# Patient Record
Sex: Female | Born: 1935 | Race: White | Hispanic: No | Smoking: Never smoker
Health system: Southern US, Community
[De-identification: ages and names within clinical notes are randomized; demographics above are authoritative.]

## PROBLEM LIST (undated history)

## (undated) DIAGNOSIS — J309 Allergic rhinitis, unspecified: Secondary | ICD-10-CM

## (undated) DIAGNOSIS — I4891 Unspecified atrial fibrillation: Secondary | ICD-10-CM

## (undated) DIAGNOSIS — M1990 Unspecified osteoarthritis, unspecified site: Secondary | ICD-10-CM

## (undated) DIAGNOSIS — F419 Anxiety disorder, unspecified: Secondary | ICD-10-CM

## (undated) DIAGNOSIS — K219 Gastro-esophageal reflux disease without esophagitis: Secondary | ICD-10-CM

## (undated) DIAGNOSIS — R05 Cough: Secondary | ICD-10-CM

## (undated) DIAGNOSIS — J189 Pneumonia, unspecified organism: Secondary | ICD-10-CM

## (undated) DIAGNOSIS — I10 Essential (primary) hypertension: Secondary | ICD-10-CM

## (undated) DIAGNOSIS — N3941 Urge incontinence: Secondary | ICD-10-CM

## (undated) DIAGNOSIS — E785 Hyperlipidemia, unspecified: Secondary | ICD-10-CM

## (undated) DIAGNOSIS — E871 Hypo-osmolality and hyponatremia: Secondary | ICD-10-CM

## (undated) HISTORY — DX: Cough: R05

## (undated) HISTORY — PX: TOTAL ABDOMINAL HYSTERECTOMY: SHX209

## (undated) HISTORY — PX: CARDIOVASCULAR STRESS TEST: SHX262

## (undated) HISTORY — PX: APPENDECTOMY: SHX54

## (undated) HISTORY — DX: Hypo-osmolality and hyponatremia: E87.1

## (undated) HISTORY — PX: TRANSTHORACIC ECHOCARDIOGRAM: SHX275

## (undated) HISTORY — DX: Hyperlipidemia, unspecified: E78.5

## (undated) HISTORY — DX: Essential (primary) hypertension: I10

## (undated) HISTORY — DX: Urge incontinence: N39.41

## (undated) HISTORY — DX: Unspecified atrial fibrillation: I48.91

## (undated) HISTORY — DX: Allergic rhinitis, unspecified: J30.9

## (undated) HISTORY — DX: Gastro-esophageal reflux disease without esophagitis: K21.9

---

## 1999-07-16 ENCOUNTER — Encounter: Payer: Self-pay | Admitting: Obstetrics and Gynecology

## 1999-07-16 ENCOUNTER — Encounter: Admission: RE | Admit: 1999-07-16 | Discharge: 1999-07-16 | Payer: Self-pay | Admitting: Obstetrics and Gynecology

## 1999-08-22 ENCOUNTER — Encounter: Admission: RE | Admit: 1999-08-22 | Discharge: 1999-08-22 | Payer: Self-pay | Admitting: Obstetrics and Gynecology

## 1999-08-22 ENCOUNTER — Encounter: Payer: Self-pay | Admitting: Obstetrics and Gynecology

## 2000-08-25 ENCOUNTER — Encounter: Admission: RE | Admit: 2000-08-25 | Discharge: 2000-08-25 | Payer: Self-pay | Admitting: Obstetrics and Gynecology

## 2000-08-25 ENCOUNTER — Encounter: Payer: Self-pay | Admitting: Obstetrics and Gynecology

## 2001-07-12 ENCOUNTER — Encounter: Payer: Self-pay | Admitting: Obstetrics and Gynecology

## 2001-07-12 ENCOUNTER — Encounter: Admission: RE | Admit: 2001-07-12 | Discharge: 2001-07-12 | Payer: Self-pay | Admitting: Obstetrics and Gynecology

## 2001-07-15 ENCOUNTER — Other Ambulatory Visit: Admission: RE | Admit: 2001-07-15 | Discharge: 2001-07-15 | Payer: Self-pay | Admitting: Obstetrics and Gynecology

## 2002-07-13 ENCOUNTER — Encounter: Admission: RE | Admit: 2002-07-13 | Discharge: 2002-07-13 | Payer: Self-pay | Admitting: Gynecology

## 2002-07-13 ENCOUNTER — Encounter: Payer: Self-pay | Admitting: Gynecology

## 2002-07-25 ENCOUNTER — Other Ambulatory Visit: Admission: RE | Admit: 2002-07-25 | Discharge: 2002-07-25 | Payer: Self-pay | Admitting: Gynecology

## 2003-01-05 ENCOUNTER — Encounter: Admission: RE | Admit: 2003-01-05 | Discharge: 2003-01-05 | Payer: Self-pay | Admitting: Unknown Physician Specialty

## 2003-01-05 ENCOUNTER — Encounter: Payer: Self-pay | Admitting: Unknown Physician Specialty

## 2003-09-20 ENCOUNTER — Encounter: Admission: RE | Admit: 2003-09-20 | Discharge: 2003-09-20 | Payer: Self-pay | Admitting: Gynecology

## 2004-10-07 ENCOUNTER — Encounter: Admission: RE | Admit: 2004-10-07 | Discharge: 2004-10-07 | Payer: Self-pay | Admitting: Gynecology

## 2004-11-25 ENCOUNTER — Other Ambulatory Visit: Admission: RE | Admit: 2004-11-25 | Discharge: 2004-11-25 | Payer: Self-pay | Admitting: Gynecology

## 2005-10-13 ENCOUNTER — Encounter: Admission: RE | Admit: 2005-10-13 | Discharge: 2005-10-13 | Payer: Self-pay | Admitting: Gynecology

## 2006-11-09 ENCOUNTER — Encounter: Admission: RE | Admit: 2006-11-09 | Discharge: 2006-11-09 | Payer: Self-pay | Admitting: Gynecology

## 2007-03-22 ENCOUNTER — Other Ambulatory Visit: Admission: RE | Admit: 2007-03-22 | Discharge: 2007-03-22 | Payer: Self-pay | Admitting: Gynecology

## 2007-11-29 ENCOUNTER — Encounter: Admission: RE | Admit: 2007-11-29 | Discharge: 2007-11-29 | Payer: Self-pay | Admitting: Gynecology

## 2008-10-16 ENCOUNTER — Encounter: Admission: RE | Admit: 2008-10-16 | Discharge: 2008-10-16 | Payer: Self-pay | Admitting: Gynecology

## 2008-10-17 ENCOUNTER — Ambulatory Visit (HOSPITAL_BASED_OUTPATIENT_CLINIC_OR_DEPARTMENT_OTHER): Admission: RE | Admit: 2008-10-17 | Discharge: 2008-10-18 | Payer: Self-pay | Admitting: Gynecology

## 2009-06-14 ENCOUNTER — Encounter: Admission: RE | Admit: 2009-06-14 | Discharge: 2009-06-14 | Payer: Self-pay | Admitting: Gynecology

## 2010-01-08 ENCOUNTER — Encounter: Admission: RE | Admit: 2010-01-08 | Discharge: 2010-01-08 | Payer: Self-pay | Admitting: Gynecology

## 2010-11-07 LAB — POCT I-STAT 4, (NA,K, GLUC, HGB,HCT)
Hemoglobin: 15.6 g/dL — ABNORMAL HIGH (ref 12.0–15.0)
Potassium: 3.5 mEq/L (ref 3.5–5.1)

## 2010-12-10 NOTE — Op Note (Signed)
NAMEJADWIGA, Sandra Lynch              ACCOUNT NO.:  [id number]   MEDICAL RECORD NO.:  [id number]          PATIENT TYPE:  AMB   LOCATION:  [facility]                         FACILITY:  [facility]   PHYSICIAN:  Gretta Cool, M.D. DATE OF BIRTH:  04/16/1935   DATE OF PROCEDURE:  DATE OF DISCHARGE:                               OPERATIVE REPORT   PREOPERATIVE DIAGNOSES:  Pelvic organ prolapse.  No longer controlled by  pessary, with grade 3 enterocele, rectocele and bulb descent.  The   POSTOPERATIVE DIAGNOSES:  1. Pelvic organ prolapse.  No longer controlled by pessary, with grade      3 enterocele, rectocele and bulb descent.  2. Anterior enterocele.   PROCEDURE:  Posterior enterocele repairs, cardinal uterosacral  colposuspension and partial colpocleisis.   SURGEON:  Beather Arbour, MD.   ASSISTANT:  Lodema Hong, MD.   ANESTHESIA:  General orotracheal.   DESCRIPTION OF PROCEDURE:  Under excellent general anesthesia with the  patient prepped and draped in the lithotomy position with Allen  stirrups, the introitus was grasped with Allis clamps and a V-shaped  incision was made between the clamps with removal of the intervening  skin and subcutaneous tissue.  At this point the vaginal mucosa was  grasped with Allis clamps and undermined from the introitus to the apex  of the vagina.  An enormous bulge of enterocele with exceedingly thin  mucosa overlying the bowel and enterocele sac was identified.  .  With  dissection of the enterocele to the apex of the vagina, the previous  vaginal closure was identified.  Anterior to that previous vaginal  closure there was a second anterior enterocele that bulged through an  exceedingly thin vaginal mucosa.  There was easy transillumination of  bowel underneath..  At this point dissection was continued past the  adhesions at the previous vaginal cuff closure at her hysterectomy done  elsewhere.  The anterior and posterior enterocele sacs were then  opened  and reduced by pursestring closure, so as to pushed bowel up into an  intra-abdominal position.  Progressive pursestring closures were used to  reduce the enterocele sac anterior and posterior to a manageable size.  At this point the fascia was plicated in the midline with mattress-type  interrupted sutures so as to further reduce the enormous enterocele, a  softball-size bulge through the introitus down to a size that could  allow visualization of the apex of the vaginal cuff.  Once the  perirectal vaginal mucosa was dissected adequately from the perirectal  fascia and the underlying enterocele structures, the uterosacral  cardinal complex was identified on each side.  The interrupted sutures  of 2-0 Novofil was then secured through the uterosacral complex and  secured to the detached perirectal fascia.  The perirectal fascia was  then pulled back and secured to the cardinal uterosacral complex.  The  anterior peritoneum was dissected from the anterior wall and fascia.  The anterior fascia mucosa was then secured to the cardinal uterosacral  complex and strong posterior vaginal fascia, so as to make a complete  envelope of anterior vaginal fascia and prevent  recurrence of the  anterior enterocele.  The central portion of the fascia was then  approximated with permanent suture of 0-Novofil to close it in  pursestring fashion and the cardinal uterosacral complex at each angle,  posterior fascia and to anterior fascia.  A second suture of 0-Vicryl  was used to close by pursestring fashion the entire fascial defect.  The  upper portion of the perirectal fascia was then plicated in the midline  with running suture of 0-Vicryl from the apex of the vagina to the  introitus.  The levator fascia was plicated in the midline so as to  reinforce the fascial defects previously repaired.  The mucosa was then  trimmed and the upper layers of the perirectal fascia and vaginal mucosa  were closed  with a running closure of 2-0 Vicryl.  The perineal body  muscles were then rebuilt with interrupted sutures of 3-0 Vicryl and the  skin was closed and subcutaneum and submucosa were closed with a running  suture of 3-0 Vicryl.  At this point the procedure was terminated  without complication.  The patient was returned to the recovery room in  excellent condition.           ______________________________  Gretta Cool, M.D.     CWL/MEDQ  D:  10/17/2008  T:  10/17/2008  Job:  [id number]   cc:   Luvenia Redden, M.D.  Fax: [phone]   Dr. Arlyce Dice, [place]

## 2015-09-28 DIAGNOSIS — J069 Acute upper respiratory infection, unspecified: Secondary | ICD-10-CM | POA: Diagnosis not present

## 2015-09-28 DIAGNOSIS — F324 Major depressive disorder, single episode, in partial remission: Secondary | ICD-10-CM | POA: Insufficient documentation

## 2015-09-28 DIAGNOSIS — K2960 Other gastritis without bleeding: Secondary | ICD-10-CM | POA: Insufficient documentation

## 2015-09-28 DIAGNOSIS — Z79899 Other long term (current) drug therapy: Secondary | ICD-10-CM | POA: Insufficient documentation

## 2015-09-28 DIAGNOSIS — R079 Chest pain, unspecified: Secondary | ICD-10-CM | POA: Diagnosis not present

## 2015-09-28 DIAGNOSIS — M1611 Unilateral primary osteoarthritis, right hip: Secondary | ICD-10-CM | POA: Insufficient documentation

## 2015-09-28 DIAGNOSIS — IMO0002 Reserved for concepts with insufficient information to code with codable children: Secondary | ICD-10-CM | POA: Insufficient documentation

## 2015-09-28 DIAGNOSIS — G629 Polyneuropathy, unspecified: Secondary | ICD-10-CM | POA: Insufficient documentation

## 2015-09-28 DIAGNOSIS — M150 Primary generalized (osteo)arthritis: Secondary | ICD-10-CM

## 2015-09-28 DIAGNOSIS — N3946 Mixed incontinence: Secondary | ICD-10-CM | POA: Insufficient documentation

## 2015-09-28 DIAGNOSIS — R05 Cough: Secondary | ICD-10-CM | POA: Diagnosis not present

## 2015-09-28 DIAGNOSIS — Z9889 Other specified postprocedural states: Secondary | ICD-10-CM | POA: Insufficient documentation

## 2015-09-28 DIAGNOSIS — F419 Anxiety disorder, unspecified: Secondary | ICD-10-CM | POA: Diagnosis not present

## 2015-09-28 DIAGNOSIS — B9789 Other viral agents as the cause of diseases classified elsewhere: Secondary | ICD-10-CM | POA: Diagnosis not present

## 2015-09-28 DIAGNOSIS — E1142 Type 2 diabetes mellitus with diabetic polyneuropathy: Secondary | ICD-10-CM | POA: Insufficient documentation

## 2015-09-28 DIAGNOSIS — M159 Polyosteoarthritis, unspecified: Secondary | ICD-10-CM | POA: Insufficient documentation

## 2015-11-15 DIAGNOSIS — Z79899 Other long term (current) drug therapy: Secondary | ICD-10-CM | POA: Diagnosis not present

## 2015-11-15 DIAGNOSIS — G629 Polyneuropathy, unspecified: Secondary | ICD-10-CM | POA: Diagnosis not present

## 2015-11-15 DIAGNOSIS — R2689 Other abnormalities of gait and mobility: Secondary | ICD-10-CM | POA: Diagnosis not present

## 2015-11-15 DIAGNOSIS — I10 Essential (primary) hypertension: Secondary | ICD-10-CM | POA: Diagnosis not present

## 2015-11-15 DIAGNOSIS — E1142 Type 2 diabetes mellitus with diabetic polyneuropathy: Secondary | ICD-10-CM | POA: Diagnosis not present

## 2015-11-28 DIAGNOSIS — N3946 Mixed incontinence: Secondary | ICD-10-CM | POA: Diagnosis not present

## 2015-11-28 DIAGNOSIS — I10 Essential (primary) hypertension: Secondary | ICD-10-CM | POA: Diagnosis not present

## 2015-11-28 DIAGNOSIS — E7800 Pure hypercholesterolemia, unspecified: Secondary | ICD-10-CM | POA: Diagnosis not present

## 2015-12-25 DIAGNOSIS — Z1231 Encounter for screening mammogram for malignant neoplasm of breast: Secondary | ICD-10-CM | POA: Diagnosis not present

## 2016-02-05 DIAGNOSIS — F419 Anxiety disorder, unspecified: Secondary | ICD-10-CM | POA: Diagnosis not present

## 2016-02-05 DIAGNOSIS — F324 Major depressive disorder, single episode, in partial remission: Secondary | ICD-10-CM | POA: Diagnosis not present

## 2016-02-05 DIAGNOSIS — Z298 Encounter for other specified prophylactic measures: Secondary | ICD-10-CM | POA: Diagnosis not present

## 2016-02-05 DIAGNOSIS — I10 Essential (primary) hypertension: Secondary | ICD-10-CM | POA: Diagnosis not present

## 2016-02-05 DIAGNOSIS — E1142 Type 2 diabetes mellitus with diabetic polyneuropathy: Secondary | ICD-10-CM | POA: Diagnosis not present

## 2016-05-09 DIAGNOSIS — R269 Unspecified abnormalities of gait and mobility: Secondary | ICD-10-CM | POA: Insufficient documentation

## 2016-05-09 DIAGNOSIS — Z79899 Other long term (current) drug therapy: Secondary | ICD-10-CM | POA: Diagnosis not present

## 2016-05-09 DIAGNOSIS — Z23 Encounter for immunization: Secondary | ICD-10-CM | POA: Diagnosis not present

## 2016-05-09 DIAGNOSIS — E1142 Type 2 diabetes mellitus with diabetic polyneuropathy: Secondary | ICD-10-CM | POA: Diagnosis not present

## 2016-05-09 DIAGNOSIS — I10 Essential (primary) hypertension: Secondary | ICD-10-CM | POA: Diagnosis not present

## 2016-05-09 DIAGNOSIS — F419 Anxiety disorder, unspecified: Secondary | ICD-10-CM | POA: Diagnosis not present

## 2016-05-12 DIAGNOSIS — R2689 Other abnormalities of gait and mobility: Secondary | ICD-10-CM | POA: Diagnosis not present

## 2016-05-12 DIAGNOSIS — Q828 Other specified congenital malformations of skin: Secondary | ICD-10-CM | POA: Diagnosis not present

## 2016-05-12 DIAGNOSIS — M205X1 Other deformities of toe(s) (acquired), right foot: Secondary | ICD-10-CM | POA: Diagnosis not present

## 2016-05-12 DIAGNOSIS — M2011 Hallux valgus (acquired), right foot: Secondary | ICD-10-CM | POA: Diagnosis not present

## 2016-05-12 DIAGNOSIS — M2012 Hallux valgus (acquired), left foot: Secondary | ICD-10-CM | POA: Diagnosis not present

## 2016-05-16 DIAGNOSIS — Q828 Other specified congenital malformations of skin: Secondary | ICD-10-CM | POA: Diagnosis not present

## 2016-05-16 DIAGNOSIS — M205X1 Other deformities of toe(s) (acquired), right foot: Secondary | ICD-10-CM | POA: Diagnosis not present

## 2016-05-16 DIAGNOSIS — M2011 Hallux valgus (acquired), right foot: Secondary | ICD-10-CM | POA: Diagnosis not present

## 2016-05-16 DIAGNOSIS — R2689 Other abnormalities of gait and mobility: Secondary | ICD-10-CM | POA: Diagnosis not present

## 2016-05-21 DIAGNOSIS — M205X1 Other deformities of toe(s) (acquired), right foot: Secondary | ICD-10-CM | POA: Diagnosis not present

## 2016-05-21 DIAGNOSIS — M2011 Hallux valgus (acquired), right foot: Secondary | ICD-10-CM | POA: Diagnosis not present

## 2016-05-21 DIAGNOSIS — R2689 Other abnormalities of gait and mobility: Secondary | ICD-10-CM | POA: Diagnosis not present

## 2016-05-21 DIAGNOSIS — Q828 Other specified congenital malformations of skin: Secondary | ICD-10-CM | POA: Diagnosis not present

## 2016-05-23 DIAGNOSIS — R2689 Other abnormalities of gait and mobility: Secondary | ICD-10-CM | POA: Diagnosis not present

## 2016-05-23 DIAGNOSIS — Q828 Other specified congenital malformations of skin: Secondary | ICD-10-CM | POA: Diagnosis not present

## 2016-05-23 DIAGNOSIS — M2011 Hallux valgus (acquired), right foot: Secondary | ICD-10-CM | POA: Diagnosis not present

## 2016-05-23 DIAGNOSIS — M205X1 Other deformities of toe(s) (acquired), right foot: Secondary | ICD-10-CM | POA: Diagnosis not present

## 2016-05-28 DIAGNOSIS — Q828 Other specified congenital malformations of skin: Secondary | ICD-10-CM | POA: Diagnosis not present

## 2016-05-28 DIAGNOSIS — M2011 Hallux valgus (acquired), right foot: Secondary | ICD-10-CM | POA: Diagnosis not present

## 2016-05-28 DIAGNOSIS — M205X1 Other deformities of toe(s) (acquired), right foot: Secondary | ICD-10-CM | POA: Diagnosis not present

## 2016-05-28 DIAGNOSIS — R2689 Other abnormalities of gait and mobility: Secondary | ICD-10-CM | POA: Diagnosis not present

## 2016-05-30 DIAGNOSIS — R2689 Other abnormalities of gait and mobility: Secondary | ICD-10-CM | POA: Diagnosis not present

## 2016-05-30 DIAGNOSIS — M205X1 Other deformities of toe(s) (acquired), right foot: Secondary | ICD-10-CM | POA: Diagnosis not present

## 2016-05-30 DIAGNOSIS — Q828 Other specified congenital malformations of skin: Secondary | ICD-10-CM | POA: Diagnosis not present

## 2016-05-30 DIAGNOSIS — M2011 Hallux valgus (acquired), right foot: Secondary | ICD-10-CM | POA: Diagnosis not present

## 2016-06-02 DIAGNOSIS — H25813 Combined forms of age-related cataract, bilateral: Secondary | ICD-10-CM | POA: Diagnosis not present

## 2016-06-03 DIAGNOSIS — J069 Acute upper respiratory infection, unspecified: Secondary | ICD-10-CM | POA: Diagnosis not present

## 2016-06-03 DIAGNOSIS — B9789 Other viral agents as the cause of diseases classified elsewhere: Secondary | ICD-10-CM | POA: Diagnosis not present

## 2016-06-03 DIAGNOSIS — R269 Unspecified abnormalities of gait and mobility: Secondary | ICD-10-CM | POA: Diagnosis not present

## 2016-06-04 DIAGNOSIS — R269 Unspecified abnormalities of gait and mobility: Secondary | ICD-10-CM | POA: Diagnosis not present

## 2016-06-06 DIAGNOSIS — R2689 Other abnormalities of gait and mobility: Secondary | ICD-10-CM | POA: Diagnosis not present

## 2016-06-06 DIAGNOSIS — M205X1 Other deformities of toe(s) (acquired), right foot: Secondary | ICD-10-CM | POA: Diagnosis not present

## 2016-06-06 DIAGNOSIS — M2011 Hallux valgus (acquired), right foot: Secondary | ICD-10-CM | POA: Diagnosis not present

## 2016-06-06 DIAGNOSIS — Q828 Other specified congenital malformations of skin: Secondary | ICD-10-CM | POA: Diagnosis not present

## 2016-06-10 DIAGNOSIS — Q828 Other specified congenital malformations of skin: Secondary | ICD-10-CM | POA: Diagnosis not present

## 2016-06-10 DIAGNOSIS — M2011 Hallux valgus (acquired), right foot: Secondary | ICD-10-CM | POA: Diagnosis not present

## 2016-06-10 DIAGNOSIS — R2689 Other abnormalities of gait and mobility: Secondary | ICD-10-CM | POA: Diagnosis not present

## 2016-06-10 DIAGNOSIS — M205X1 Other deformities of toe(s) (acquired), right foot: Secondary | ICD-10-CM | POA: Diagnosis not present

## 2016-06-13 DIAGNOSIS — M205X1 Other deformities of toe(s) (acquired), right foot: Secondary | ICD-10-CM | POA: Diagnosis not present

## 2016-06-13 DIAGNOSIS — Q828 Other specified congenital malformations of skin: Secondary | ICD-10-CM | POA: Diagnosis not present

## 2016-06-13 DIAGNOSIS — M2011 Hallux valgus (acquired), right foot: Secondary | ICD-10-CM | POA: Diagnosis not present

## 2016-06-13 DIAGNOSIS — R2689 Other abnormalities of gait and mobility: Secondary | ICD-10-CM | POA: Diagnosis not present

## 2016-06-17 DIAGNOSIS — M205X1 Other deformities of toe(s) (acquired), right foot: Secondary | ICD-10-CM | POA: Diagnosis not present

## 2016-06-17 DIAGNOSIS — M2011 Hallux valgus (acquired), right foot: Secondary | ICD-10-CM | POA: Diagnosis not present

## 2016-06-17 DIAGNOSIS — R2689 Other abnormalities of gait and mobility: Secondary | ICD-10-CM | POA: Diagnosis not present

## 2016-06-17 DIAGNOSIS — Q828 Other specified congenital malformations of skin: Secondary | ICD-10-CM | POA: Diagnosis not present

## 2016-06-24 DIAGNOSIS — Q828 Other specified congenital malformations of skin: Secondary | ICD-10-CM | POA: Diagnosis not present

## 2016-06-24 DIAGNOSIS — R2689 Other abnormalities of gait and mobility: Secondary | ICD-10-CM | POA: Diagnosis not present

## 2016-06-24 DIAGNOSIS — M2011 Hallux valgus (acquired), right foot: Secondary | ICD-10-CM | POA: Diagnosis not present

## 2016-06-24 DIAGNOSIS — M205X1 Other deformities of toe(s) (acquired), right foot: Secondary | ICD-10-CM | POA: Diagnosis not present

## 2016-06-27 DIAGNOSIS — M205X1 Other deformities of toe(s) (acquired), right foot: Secondary | ICD-10-CM | POA: Diagnosis not present

## 2016-06-27 DIAGNOSIS — Q828 Other specified congenital malformations of skin: Secondary | ICD-10-CM | POA: Diagnosis not present

## 2016-06-27 DIAGNOSIS — M2011 Hallux valgus (acquired), right foot: Secondary | ICD-10-CM | POA: Diagnosis not present

## 2016-06-27 DIAGNOSIS — R2689 Other abnormalities of gait and mobility: Secondary | ICD-10-CM | POA: Diagnosis not present

## 2016-07-08 DIAGNOSIS — M2011 Hallux valgus (acquired), right foot: Secondary | ICD-10-CM | POA: Diagnosis not present

## 2016-07-08 DIAGNOSIS — M205X1 Other deformities of toe(s) (acquired), right foot: Secondary | ICD-10-CM | POA: Diagnosis not present

## 2016-07-08 DIAGNOSIS — R2689 Other abnormalities of gait and mobility: Secondary | ICD-10-CM | POA: Diagnosis not present

## 2016-07-08 DIAGNOSIS — Q828 Other specified congenital malformations of skin: Secondary | ICD-10-CM | POA: Diagnosis not present

## 2016-07-15 DIAGNOSIS — Q828 Other specified congenital malformations of skin: Secondary | ICD-10-CM | POA: Diagnosis not present

## 2016-07-15 DIAGNOSIS — M2011 Hallux valgus (acquired), right foot: Secondary | ICD-10-CM | POA: Diagnosis not present

## 2016-07-15 DIAGNOSIS — M205X1 Other deformities of toe(s) (acquired), right foot: Secondary | ICD-10-CM | POA: Diagnosis not present

## 2016-07-15 DIAGNOSIS — R2689 Other abnormalities of gait and mobility: Secondary | ICD-10-CM | POA: Diagnosis not present

## 2016-07-25 DIAGNOSIS — Q828 Other specified congenital malformations of skin: Secondary | ICD-10-CM | POA: Diagnosis not present

## 2016-07-25 DIAGNOSIS — R2689 Other abnormalities of gait and mobility: Secondary | ICD-10-CM | POA: Diagnosis not present

## 2016-07-25 DIAGNOSIS — M205X1 Other deformities of toe(s) (acquired), right foot: Secondary | ICD-10-CM | POA: Diagnosis not present

## 2016-07-25 DIAGNOSIS — M2011 Hallux valgus (acquired), right foot: Secondary | ICD-10-CM | POA: Diagnosis not present

## 2016-07-29 DIAGNOSIS — R2689 Other abnormalities of gait and mobility: Secondary | ICD-10-CM | POA: Diagnosis not present

## 2016-07-29 DIAGNOSIS — Q828 Other specified congenital malformations of skin: Secondary | ICD-10-CM | POA: Diagnosis not present

## 2016-07-29 DIAGNOSIS — M205X1 Other deformities of toe(s) (acquired), right foot: Secondary | ICD-10-CM | POA: Diagnosis not present

## 2016-07-29 DIAGNOSIS — M2011 Hallux valgus (acquired), right foot: Secondary | ICD-10-CM | POA: Diagnosis not present

## 2016-08-01 DIAGNOSIS — Q828 Other specified congenital malformations of skin: Secondary | ICD-10-CM | POA: Diagnosis not present

## 2016-08-01 DIAGNOSIS — R2689 Other abnormalities of gait and mobility: Secondary | ICD-10-CM | POA: Diagnosis not present

## 2016-08-01 DIAGNOSIS — M205X1 Other deformities of toe(s) (acquired), right foot: Secondary | ICD-10-CM | POA: Diagnosis not present

## 2016-08-01 DIAGNOSIS — M2011 Hallux valgus (acquired), right foot: Secondary | ICD-10-CM | POA: Diagnosis not present

## 2016-08-04 DIAGNOSIS — R2689 Other abnormalities of gait and mobility: Secondary | ICD-10-CM | POA: Diagnosis not present

## 2016-08-04 DIAGNOSIS — M2011 Hallux valgus (acquired), right foot: Secondary | ICD-10-CM | POA: Diagnosis not present

## 2016-08-04 DIAGNOSIS — M205X1 Other deformities of toe(s) (acquired), right foot: Secondary | ICD-10-CM | POA: Diagnosis not present

## 2016-08-04 DIAGNOSIS — Q828 Other specified congenital malformations of skin: Secondary | ICD-10-CM | POA: Diagnosis not present

## 2016-08-08 DIAGNOSIS — R2689 Other abnormalities of gait and mobility: Secondary | ICD-10-CM | POA: Diagnosis not present

## 2016-08-08 DIAGNOSIS — Q828 Other specified congenital malformations of skin: Secondary | ICD-10-CM | POA: Diagnosis not present

## 2016-08-08 DIAGNOSIS — M205X1 Other deformities of toe(s) (acquired), right foot: Secondary | ICD-10-CM | POA: Diagnosis not present

## 2016-08-08 DIAGNOSIS — M2011 Hallux valgus (acquired), right foot: Secondary | ICD-10-CM | POA: Diagnosis not present

## 2016-08-12 DIAGNOSIS — R2689 Other abnormalities of gait and mobility: Secondary | ICD-10-CM | POA: Diagnosis not present

## 2016-08-12 DIAGNOSIS — M2011 Hallux valgus (acquired), right foot: Secondary | ICD-10-CM | POA: Diagnosis not present

## 2016-08-12 DIAGNOSIS — Q828 Other specified congenital malformations of skin: Secondary | ICD-10-CM | POA: Diagnosis not present

## 2016-08-12 DIAGNOSIS — M205X1 Other deformities of toe(s) (acquired), right foot: Secondary | ICD-10-CM | POA: Diagnosis not present

## 2016-08-19 DIAGNOSIS — R2689 Other abnormalities of gait and mobility: Secondary | ICD-10-CM | POA: Diagnosis not present

## 2016-08-19 DIAGNOSIS — Q828 Other specified congenital malformations of skin: Secondary | ICD-10-CM | POA: Diagnosis not present

## 2016-08-19 DIAGNOSIS — M205X1 Other deformities of toe(s) (acquired), right foot: Secondary | ICD-10-CM | POA: Diagnosis not present

## 2016-08-19 DIAGNOSIS — M2011 Hallux valgus (acquired), right foot: Secondary | ICD-10-CM | POA: Diagnosis not present

## 2016-08-22 DIAGNOSIS — R2689 Other abnormalities of gait and mobility: Secondary | ICD-10-CM | POA: Diagnosis not present

## 2016-08-22 DIAGNOSIS — Q828 Other specified congenital malformations of skin: Secondary | ICD-10-CM | POA: Diagnosis not present

## 2016-08-22 DIAGNOSIS — M205X1 Other deformities of toe(s) (acquired), right foot: Secondary | ICD-10-CM | POA: Diagnosis not present

## 2016-08-22 DIAGNOSIS — M2011 Hallux valgus (acquired), right foot: Secondary | ICD-10-CM | POA: Diagnosis not present

## 2016-08-25 DIAGNOSIS — M205X1 Other deformities of toe(s) (acquired), right foot: Secondary | ICD-10-CM | POA: Diagnosis not present

## 2016-08-25 DIAGNOSIS — Q828 Other specified congenital malformations of skin: Secondary | ICD-10-CM | POA: Diagnosis not present

## 2016-08-25 DIAGNOSIS — R2689 Other abnormalities of gait and mobility: Secondary | ICD-10-CM | POA: Diagnosis not present

## 2016-08-25 DIAGNOSIS — M2011 Hallux valgus (acquired), right foot: Secondary | ICD-10-CM | POA: Diagnosis not present

## 2016-08-29 DIAGNOSIS — M205X1 Other deformities of toe(s) (acquired), right foot: Secondary | ICD-10-CM | POA: Diagnosis not present

## 2016-08-29 DIAGNOSIS — Q828 Other specified congenital malformations of skin: Secondary | ICD-10-CM | POA: Diagnosis not present

## 2016-08-29 DIAGNOSIS — R2689 Other abnormalities of gait and mobility: Secondary | ICD-10-CM | POA: Diagnosis not present

## 2016-08-29 DIAGNOSIS — M2011 Hallux valgus (acquired), right foot: Secondary | ICD-10-CM | POA: Diagnosis not present

## 2016-09-02 DIAGNOSIS — M205X1 Other deformities of toe(s) (acquired), right foot: Secondary | ICD-10-CM | POA: Diagnosis not present

## 2016-09-02 DIAGNOSIS — M2011 Hallux valgus (acquired), right foot: Secondary | ICD-10-CM | POA: Diagnosis not present

## 2016-09-02 DIAGNOSIS — Q828 Other specified congenital malformations of skin: Secondary | ICD-10-CM | POA: Diagnosis not present

## 2016-09-02 DIAGNOSIS — R2689 Other abnormalities of gait and mobility: Secondary | ICD-10-CM | POA: Diagnosis not present

## 2016-09-05 DIAGNOSIS — M2011 Hallux valgus (acquired), right foot: Secondary | ICD-10-CM | POA: Diagnosis not present

## 2016-09-05 DIAGNOSIS — R2689 Other abnormalities of gait and mobility: Secondary | ICD-10-CM | POA: Diagnosis not present

## 2016-09-05 DIAGNOSIS — Q828 Other specified congenital malformations of skin: Secondary | ICD-10-CM | POA: Diagnosis not present

## 2016-09-05 DIAGNOSIS — M205X1 Other deformities of toe(s) (acquired), right foot: Secondary | ICD-10-CM | POA: Diagnosis not present

## 2016-09-08 DIAGNOSIS — R2689 Other abnormalities of gait and mobility: Secondary | ICD-10-CM | POA: Diagnosis not present

## 2016-09-08 DIAGNOSIS — Q828 Other specified congenital malformations of skin: Secondary | ICD-10-CM | POA: Diagnosis not present

## 2016-09-08 DIAGNOSIS — M205X1 Other deformities of toe(s) (acquired), right foot: Secondary | ICD-10-CM | POA: Diagnosis not present

## 2016-09-08 DIAGNOSIS — M2011 Hallux valgus (acquired), right foot: Secondary | ICD-10-CM | POA: Diagnosis not present

## 2016-09-09 DIAGNOSIS — R269 Unspecified abnormalities of gait and mobility: Secondary | ICD-10-CM | POA: Diagnosis not present

## 2016-09-09 DIAGNOSIS — I10 Essential (primary) hypertension: Secondary | ICD-10-CM | POA: Diagnosis not present

## 2016-09-09 DIAGNOSIS — G629 Polyneuropathy, unspecified: Secondary | ICD-10-CM | POA: Diagnosis not present

## 2016-09-09 DIAGNOSIS — Z79899 Other long term (current) drug therapy: Secondary | ICD-10-CM | POA: Diagnosis not present

## 2016-09-09 DIAGNOSIS — E1142 Type 2 diabetes mellitus with diabetic polyneuropathy: Secondary | ICD-10-CM | POA: Diagnosis not present

## 2016-09-09 DIAGNOSIS — Z1231 Encounter for screening mammogram for malignant neoplasm of breast: Secondary | ICD-10-CM | POA: Diagnosis not present

## 2016-09-12 DIAGNOSIS — M2011 Hallux valgus (acquired), right foot: Secondary | ICD-10-CM | POA: Diagnosis not present

## 2016-09-12 DIAGNOSIS — R2689 Other abnormalities of gait and mobility: Secondary | ICD-10-CM | POA: Diagnosis not present

## 2016-09-12 DIAGNOSIS — M205X1 Other deformities of toe(s) (acquired), right foot: Secondary | ICD-10-CM | POA: Diagnosis not present

## 2016-09-12 DIAGNOSIS — Q828 Other specified congenital malformations of skin: Secondary | ICD-10-CM | POA: Diagnosis not present

## 2016-09-16 DIAGNOSIS — M2011 Hallux valgus (acquired), right foot: Secondary | ICD-10-CM | POA: Diagnosis not present

## 2016-09-16 DIAGNOSIS — Q828 Other specified congenital malformations of skin: Secondary | ICD-10-CM | POA: Diagnosis not present

## 2016-09-16 DIAGNOSIS — M205X1 Other deformities of toe(s) (acquired), right foot: Secondary | ICD-10-CM | POA: Diagnosis not present

## 2016-09-16 DIAGNOSIS — R2689 Other abnormalities of gait and mobility: Secondary | ICD-10-CM | POA: Diagnosis not present

## 2016-09-17 DIAGNOSIS — E1142 Type 2 diabetes mellitus with diabetic polyneuropathy: Secondary | ICD-10-CM | POA: Diagnosis not present

## 2016-09-17 DIAGNOSIS — F419 Anxiety disorder, unspecified: Secondary | ICD-10-CM | POA: Diagnosis not present

## 2016-09-17 DIAGNOSIS — I10 Essential (primary) hypertension: Secondary | ICD-10-CM | POA: Diagnosis not present

## 2016-09-17 DIAGNOSIS — E871 Hypo-osmolality and hyponatremia: Secondary | ICD-10-CM | POA: Diagnosis not present

## 2016-09-23 DIAGNOSIS — M205X1 Other deformities of toe(s) (acquired), right foot: Secondary | ICD-10-CM | POA: Diagnosis not present

## 2016-09-23 DIAGNOSIS — M2011 Hallux valgus (acquired), right foot: Secondary | ICD-10-CM | POA: Diagnosis not present

## 2016-09-23 DIAGNOSIS — Q828 Other specified congenital malformations of skin: Secondary | ICD-10-CM | POA: Diagnosis not present

## 2016-09-23 DIAGNOSIS — R2689 Other abnormalities of gait and mobility: Secondary | ICD-10-CM | POA: Diagnosis not present

## 2016-09-26 DIAGNOSIS — M205X1 Other deformities of toe(s) (acquired), right foot: Secondary | ICD-10-CM | POA: Diagnosis not present

## 2016-09-26 DIAGNOSIS — M2011 Hallux valgus (acquired), right foot: Secondary | ICD-10-CM | POA: Diagnosis not present

## 2016-09-26 DIAGNOSIS — Q828 Other specified congenital malformations of skin: Secondary | ICD-10-CM | POA: Diagnosis not present

## 2016-09-26 DIAGNOSIS — R2689 Other abnormalities of gait and mobility: Secondary | ICD-10-CM | POA: Diagnosis not present

## 2016-09-30 DIAGNOSIS — Q828 Other specified congenital malformations of skin: Secondary | ICD-10-CM | POA: Diagnosis not present

## 2016-09-30 DIAGNOSIS — R2689 Other abnormalities of gait and mobility: Secondary | ICD-10-CM | POA: Diagnosis not present

## 2016-09-30 DIAGNOSIS — M2011 Hallux valgus (acquired), right foot: Secondary | ICD-10-CM | POA: Diagnosis not present

## 2016-09-30 DIAGNOSIS — M205X1 Other deformities of toe(s) (acquired), right foot: Secondary | ICD-10-CM | POA: Diagnosis not present

## 2016-10-03 DIAGNOSIS — M2011 Hallux valgus (acquired), right foot: Secondary | ICD-10-CM | POA: Diagnosis not present

## 2016-10-03 DIAGNOSIS — R2689 Other abnormalities of gait and mobility: Secondary | ICD-10-CM | POA: Diagnosis not present

## 2016-10-03 DIAGNOSIS — Q828 Other specified congenital malformations of skin: Secondary | ICD-10-CM | POA: Diagnosis not present

## 2016-10-03 DIAGNOSIS — M205X1 Other deformities of toe(s) (acquired), right foot: Secondary | ICD-10-CM | POA: Diagnosis not present

## 2016-10-08 DIAGNOSIS — M205X1 Other deformities of toe(s) (acquired), right foot: Secondary | ICD-10-CM | POA: Diagnosis not present

## 2016-10-08 DIAGNOSIS — M2011 Hallux valgus (acquired), right foot: Secondary | ICD-10-CM | POA: Diagnosis not present

## 2016-10-08 DIAGNOSIS — Q828 Other specified congenital malformations of skin: Secondary | ICD-10-CM | POA: Diagnosis not present

## 2016-10-08 DIAGNOSIS — R2689 Other abnormalities of gait and mobility: Secondary | ICD-10-CM | POA: Diagnosis not present

## 2016-10-20 DIAGNOSIS — M2011 Hallux valgus (acquired), right foot: Secondary | ICD-10-CM | POA: Diagnosis not present

## 2016-10-20 DIAGNOSIS — M205X1 Other deformities of toe(s) (acquired), right foot: Secondary | ICD-10-CM | POA: Diagnosis not present

## 2016-10-20 DIAGNOSIS — R2689 Other abnormalities of gait and mobility: Secondary | ICD-10-CM | POA: Diagnosis not present

## 2016-10-20 DIAGNOSIS — Q828 Other specified congenital malformations of skin: Secondary | ICD-10-CM | POA: Diagnosis not present

## 2016-10-23 DIAGNOSIS — M205X1 Other deformities of toe(s) (acquired), right foot: Secondary | ICD-10-CM | POA: Diagnosis not present

## 2016-10-23 DIAGNOSIS — Q828 Other specified congenital malformations of skin: Secondary | ICD-10-CM | POA: Diagnosis not present

## 2016-10-23 DIAGNOSIS — R2689 Other abnormalities of gait and mobility: Secondary | ICD-10-CM | POA: Diagnosis not present

## 2016-10-23 DIAGNOSIS — M2011 Hallux valgus (acquired), right foot: Secondary | ICD-10-CM | POA: Diagnosis not present

## 2016-10-28 DIAGNOSIS — Q828 Other specified congenital malformations of skin: Secondary | ICD-10-CM | POA: Diagnosis not present

## 2016-10-28 DIAGNOSIS — M2011 Hallux valgus (acquired), right foot: Secondary | ICD-10-CM | POA: Diagnosis not present

## 2016-10-28 DIAGNOSIS — R2689 Other abnormalities of gait and mobility: Secondary | ICD-10-CM | POA: Diagnosis not present

## 2016-10-28 DIAGNOSIS — M205X1 Other deformities of toe(s) (acquired), right foot: Secondary | ICD-10-CM | POA: Diagnosis not present

## 2016-10-30 DIAGNOSIS — R2689 Other abnormalities of gait and mobility: Secondary | ICD-10-CM | POA: Diagnosis not present

## 2016-10-30 DIAGNOSIS — M2011 Hallux valgus (acquired), right foot: Secondary | ICD-10-CM | POA: Diagnosis not present

## 2016-10-30 DIAGNOSIS — M205X1 Other deformities of toe(s) (acquired), right foot: Secondary | ICD-10-CM | POA: Diagnosis not present

## 2016-10-30 DIAGNOSIS — Q828 Other specified congenital malformations of skin: Secondary | ICD-10-CM | POA: Diagnosis not present

## 2016-11-03 DIAGNOSIS — M205X1 Other deformities of toe(s) (acquired), right foot: Secondary | ICD-10-CM | POA: Diagnosis not present

## 2016-11-03 DIAGNOSIS — Q828 Other specified congenital malformations of skin: Secondary | ICD-10-CM | POA: Diagnosis not present

## 2016-11-03 DIAGNOSIS — M2011 Hallux valgus (acquired), right foot: Secondary | ICD-10-CM | POA: Diagnosis not present

## 2016-11-03 DIAGNOSIS — R2689 Other abnormalities of gait and mobility: Secondary | ICD-10-CM | POA: Diagnosis not present

## 2016-11-07 DIAGNOSIS — M205X1 Other deformities of toe(s) (acquired), right foot: Secondary | ICD-10-CM | POA: Diagnosis not present

## 2016-11-07 DIAGNOSIS — R2689 Other abnormalities of gait and mobility: Secondary | ICD-10-CM | POA: Diagnosis not present

## 2016-11-07 DIAGNOSIS — M2011 Hallux valgus (acquired), right foot: Secondary | ICD-10-CM | POA: Diagnosis not present

## 2016-11-07 DIAGNOSIS — Q828 Other specified congenital malformations of skin: Secondary | ICD-10-CM | POA: Diagnosis not present

## 2016-11-10 DIAGNOSIS — M2011 Hallux valgus (acquired), right foot: Secondary | ICD-10-CM | POA: Diagnosis not present

## 2016-11-10 DIAGNOSIS — Q828 Other specified congenital malformations of skin: Secondary | ICD-10-CM | POA: Diagnosis not present

## 2016-11-10 DIAGNOSIS — R2689 Other abnormalities of gait and mobility: Secondary | ICD-10-CM | POA: Diagnosis not present

## 2016-11-10 DIAGNOSIS — M205X1 Other deformities of toe(s) (acquired), right foot: Secondary | ICD-10-CM | POA: Diagnosis not present

## 2016-11-14 DIAGNOSIS — R2689 Other abnormalities of gait and mobility: Secondary | ICD-10-CM | POA: Diagnosis not present

## 2016-11-14 DIAGNOSIS — M205X1 Other deformities of toe(s) (acquired), right foot: Secondary | ICD-10-CM | POA: Diagnosis not present

## 2016-11-14 DIAGNOSIS — M2011 Hallux valgus (acquired), right foot: Secondary | ICD-10-CM | POA: Diagnosis not present

## 2016-11-14 DIAGNOSIS — Q828 Other specified congenital malformations of skin: Secondary | ICD-10-CM | POA: Diagnosis not present

## 2016-11-19 DIAGNOSIS — R2689 Other abnormalities of gait and mobility: Secondary | ICD-10-CM | POA: Diagnosis not present

## 2016-11-19 DIAGNOSIS — M205X1 Other deformities of toe(s) (acquired), right foot: Secondary | ICD-10-CM | POA: Diagnosis not present

## 2016-11-19 DIAGNOSIS — Q828 Other specified congenital malformations of skin: Secondary | ICD-10-CM | POA: Diagnosis not present

## 2016-11-19 DIAGNOSIS — M2011 Hallux valgus (acquired), right foot: Secondary | ICD-10-CM | POA: Diagnosis not present

## 2016-11-21 DIAGNOSIS — M2011 Hallux valgus (acquired), right foot: Secondary | ICD-10-CM | POA: Diagnosis not present

## 2016-11-21 DIAGNOSIS — R2689 Other abnormalities of gait and mobility: Secondary | ICD-10-CM | POA: Diagnosis not present

## 2016-11-21 DIAGNOSIS — M205X1 Other deformities of toe(s) (acquired), right foot: Secondary | ICD-10-CM | POA: Diagnosis not present

## 2016-11-21 DIAGNOSIS — Q828 Other specified congenital malformations of skin: Secondary | ICD-10-CM | POA: Diagnosis not present

## 2016-11-24 DIAGNOSIS — Q828 Other specified congenital malformations of skin: Secondary | ICD-10-CM | POA: Diagnosis not present

## 2016-11-24 DIAGNOSIS — R2689 Other abnormalities of gait and mobility: Secondary | ICD-10-CM | POA: Diagnosis not present

## 2016-11-24 DIAGNOSIS — M205X1 Other deformities of toe(s) (acquired), right foot: Secondary | ICD-10-CM | POA: Diagnosis not present

## 2016-11-24 DIAGNOSIS — M2011 Hallux valgus (acquired), right foot: Secondary | ICD-10-CM | POA: Diagnosis not present

## 2016-11-28 DIAGNOSIS — R2689 Other abnormalities of gait and mobility: Secondary | ICD-10-CM | POA: Diagnosis not present

## 2016-11-28 DIAGNOSIS — M2011 Hallux valgus (acquired), right foot: Secondary | ICD-10-CM | POA: Diagnosis not present

## 2016-11-28 DIAGNOSIS — M205X1 Other deformities of toe(s) (acquired), right foot: Secondary | ICD-10-CM | POA: Diagnosis not present

## 2016-11-28 DIAGNOSIS — Q828 Other specified congenital malformations of skin: Secondary | ICD-10-CM | POA: Diagnosis not present

## 2016-12-02 DIAGNOSIS — M205X1 Other deformities of toe(s) (acquired), right foot: Secondary | ICD-10-CM | POA: Diagnosis not present

## 2016-12-02 DIAGNOSIS — M2011 Hallux valgus (acquired), right foot: Secondary | ICD-10-CM | POA: Diagnosis not present

## 2016-12-02 DIAGNOSIS — R2689 Other abnormalities of gait and mobility: Secondary | ICD-10-CM | POA: Diagnosis not present

## 2016-12-02 DIAGNOSIS — Q828 Other specified congenital malformations of skin: Secondary | ICD-10-CM | POA: Diagnosis not present

## 2016-12-05 DIAGNOSIS — Q828 Other specified congenital malformations of skin: Secondary | ICD-10-CM | POA: Diagnosis not present

## 2016-12-05 DIAGNOSIS — M205X1 Other deformities of toe(s) (acquired), right foot: Secondary | ICD-10-CM | POA: Diagnosis not present

## 2016-12-05 DIAGNOSIS — M2011 Hallux valgus (acquired), right foot: Secondary | ICD-10-CM | POA: Diagnosis not present

## 2016-12-05 DIAGNOSIS — R2689 Other abnormalities of gait and mobility: Secondary | ICD-10-CM | POA: Diagnosis not present

## 2016-12-08 DIAGNOSIS — M2011 Hallux valgus (acquired), right foot: Secondary | ICD-10-CM | POA: Diagnosis not present

## 2016-12-08 DIAGNOSIS — M205X1 Other deformities of toe(s) (acquired), right foot: Secondary | ICD-10-CM | POA: Diagnosis not present

## 2016-12-08 DIAGNOSIS — Q828 Other specified congenital malformations of skin: Secondary | ICD-10-CM | POA: Diagnosis not present

## 2016-12-08 DIAGNOSIS — R2689 Other abnormalities of gait and mobility: Secondary | ICD-10-CM | POA: Diagnosis not present

## 2016-12-12 DIAGNOSIS — Q828 Other specified congenital malformations of skin: Secondary | ICD-10-CM | POA: Diagnosis not present

## 2016-12-12 DIAGNOSIS — R2689 Other abnormalities of gait and mobility: Secondary | ICD-10-CM | POA: Diagnosis not present

## 2016-12-12 DIAGNOSIS — M2011 Hallux valgus (acquired), right foot: Secondary | ICD-10-CM | POA: Diagnosis not present

## 2016-12-12 DIAGNOSIS — M205X1 Other deformities of toe(s) (acquired), right foot: Secondary | ICD-10-CM | POA: Diagnosis not present

## 2016-12-17 DIAGNOSIS — M205X1 Other deformities of toe(s) (acquired), right foot: Secondary | ICD-10-CM | POA: Diagnosis not present

## 2016-12-17 DIAGNOSIS — M2011 Hallux valgus (acquired), right foot: Secondary | ICD-10-CM | POA: Diagnosis not present

## 2016-12-17 DIAGNOSIS — R2689 Other abnormalities of gait and mobility: Secondary | ICD-10-CM | POA: Diagnosis not present

## 2016-12-17 DIAGNOSIS — Q828 Other specified congenital malformations of skin: Secondary | ICD-10-CM | POA: Diagnosis not present

## 2016-12-19 DIAGNOSIS — R2689 Other abnormalities of gait and mobility: Secondary | ICD-10-CM | POA: Diagnosis not present

## 2016-12-19 DIAGNOSIS — Q828 Other specified congenital malformations of skin: Secondary | ICD-10-CM | POA: Diagnosis not present

## 2016-12-19 DIAGNOSIS — M2011 Hallux valgus (acquired), right foot: Secondary | ICD-10-CM | POA: Diagnosis not present

## 2016-12-19 DIAGNOSIS — M205X1 Other deformities of toe(s) (acquired), right foot: Secondary | ICD-10-CM | POA: Diagnosis not present

## 2016-12-26 DIAGNOSIS — M205X1 Other deformities of toe(s) (acquired), right foot: Secondary | ICD-10-CM | POA: Diagnosis not present

## 2016-12-26 DIAGNOSIS — R2689 Other abnormalities of gait and mobility: Secondary | ICD-10-CM | POA: Diagnosis not present

## 2016-12-26 DIAGNOSIS — Q828 Other specified congenital malformations of skin: Secondary | ICD-10-CM | POA: Diagnosis not present

## 2016-12-26 DIAGNOSIS — M2011 Hallux valgus (acquired), right foot: Secondary | ICD-10-CM | POA: Diagnosis not present

## 2016-12-29 DIAGNOSIS — R2689 Other abnormalities of gait and mobility: Secondary | ICD-10-CM | POA: Diagnosis not present

## 2016-12-29 DIAGNOSIS — Q828 Other specified congenital malformations of skin: Secondary | ICD-10-CM | POA: Diagnosis not present

## 2016-12-29 DIAGNOSIS — M205X1 Other deformities of toe(s) (acquired), right foot: Secondary | ICD-10-CM | POA: Diagnosis not present

## 2016-12-29 DIAGNOSIS — M2011 Hallux valgus (acquired), right foot: Secondary | ICD-10-CM | POA: Diagnosis not present

## 2016-12-30 DIAGNOSIS — Z1231 Encounter for screening mammogram for malignant neoplasm of breast: Secondary | ICD-10-CM | POA: Diagnosis not present

## 2017-01-02 DIAGNOSIS — M205X1 Other deformities of toe(s) (acquired), right foot: Secondary | ICD-10-CM | POA: Diagnosis not present

## 2017-01-02 DIAGNOSIS — M2011 Hallux valgus (acquired), right foot: Secondary | ICD-10-CM | POA: Diagnosis not present

## 2017-01-02 DIAGNOSIS — Q828 Other specified congenital malformations of skin: Secondary | ICD-10-CM | POA: Diagnosis not present

## 2017-01-02 DIAGNOSIS — R2689 Other abnormalities of gait and mobility: Secondary | ICD-10-CM | POA: Diagnosis not present

## 2017-01-05 DIAGNOSIS — R2689 Other abnormalities of gait and mobility: Secondary | ICD-10-CM | POA: Diagnosis not present

## 2017-01-05 DIAGNOSIS — M205X1 Other deformities of toe(s) (acquired), right foot: Secondary | ICD-10-CM | POA: Diagnosis not present

## 2017-01-05 DIAGNOSIS — Q828 Other specified congenital malformations of skin: Secondary | ICD-10-CM | POA: Diagnosis not present

## 2017-01-05 DIAGNOSIS — M2011 Hallux valgus (acquired), right foot: Secondary | ICD-10-CM | POA: Diagnosis not present

## 2017-01-09 DIAGNOSIS — M2011 Hallux valgus (acquired), right foot: Secondary | ICD-10-CM | POA: Diagnosis not present

## 2017-01-09 DIAGNOSIS — M205X1 Other deformities of toe(s) (acquired), right foot: Secondary | ICD-10-CM | POA: Diagnosis not present

## 2017-01-09 DIAGNOSIS — Q828 Other specified congenital malformations of skin: Secondary | ICD-10-CM | POA: Diagnosis not present

## 2017-01-09 DIAGNOSIS — R2689 Other abnormalities of gait and mobility: Secondary | ICD-10-CM | POA: Diagnosis not present

## 2017-01-12 DIAGNOSIS — Q828 Other specified congenital malformations of skin: Secondary | ICD-10-CM | POA: Diagnosis not present

## 2017-01-12 DIAGNOSIS — M2011 Hallux valgus (acquired), right foot: Secondary | ICD-10-CM | POA: Diagnosis not present

## 2017-01-12 DIAGNOSIS — R2689 Other abnormalities of gait and mobility: Secondary | ICD-10-CM | POA: Diagnosis not present

## 2017-01-12 DIAGNOSIS — M205X1 Other deformities of toe(s) (acquired), right foot: Secondary | ICD-10-CM | POA: Diagnosis not present

## 2017-01-14 DIAGNOSIS — R2689 Other abnormalities of gait and mobility: Secondary | ICD-10-CM | POA: Diagnosis not present

## 2017-01-14 DIAGNOSIS — M205X1 Other deformities of toe(s) (acquired), right foot: Secondary | ICD-10-CM | POA: Diagnosis not present

## 2017-01-14 DIAGNOSIS — M2011 Hallux valgus (acquired), right foot: Secondary | ICD-10-CM | POA: Diagnosis not present

## 2017-01-14 DIAGNOSIS — Q828 Other specified congenital malformations of skin: Secondary | ICD-10-CM | POA: Diagnosis not present

## 2017-01-19 DIAGNOSIS — M205X1 Other deformities of toe(s) (acquired), right foot: Secondary | ICD-10-CM | POA: Diagnosis not present

## 2017-01-19 DIAGNOSIS — M2011 Hallux valgus (acquired), right foot: Secondary | ICD-10-CM | POA: Diagnosis not present

## 2017-01-19 DIAGNOSIS — Q828 Other specified congenital malformations of skin: Secondary | ICD-10-CM | POA: Diagnosis not present

## 2017-01-19 DIAGNOSIS — R2689 Other abnormalities of gait and mobility: Secondary | ICD-10-CM | POA: Diagnosis not present

## 2017-01-26 DIAGNOSIS — R2689 Other abnormalities of gait and mobility: Secondary | ICD-10-CM | POA: Diagnosis not present

## 2017-01-26 DIAGNOSIS — Q828 Other specified congenital malformations of skin: Secondary | ICD-10-CM | POA: Diagnosis not present

## 2017-01-26 DIAGNOSIS — M205X1 Other deformities of toe(s) (acquired), right foot: Secondary | ICD-10-CM | POA: Diagnosis not present

## 2017-01-26 DIAGNOSIS — M2011 Hallux valgus (acquired), right foot: Secondary | ICD-10-CM | POA: Diagnosis not present

## 2017-02-02 DIAGNOSIS — Q828 Other specified congenital malformations of skin: Secondary | ICD-10-CM | POA: Diagnosis not present

## 2017-02-02 DIAGNOSIS — M205X1 Other deformities of toe(s) (acquired), right foot: Secondary | ICD-10-CM | POA: Diagnosis not present

## 2017-02-02 DIAGNOSIS — R2689 Other abnormalities of gait and mobility: Secondary | ICD-10-CM | POA: Diagnosis not present

## 2017-02-02 DIAGNOSIS — M2011 Hallux valgus (acquired), right foot: Secondary | ICD-10-CM | POA: Diagnosis not present

## 2017-02-06 DIAGNOSIS — M2011 Hallux valgus (acquired), right foot: Secondary | ICD-10-CM | POA: Diagnosis not present

## 2017-02-06 DIAGNOSIS — Q828 Other specified congenital malformations of skin: Secondary | ICD-10-CM | POA: Diagnosis not present

## 2017-02-06 DIAGNOSIS — M205X1 Other deformities of toe(s) (acquired), right foot: Secondary | ICD-10-CM | POA: Diagnosis not present

## 2017-02-06 DIAGNOSIS — R2689 Other abnormalities of gait and mobility: Secondary | ICD-10-CM | POA: Diagnosis not present

## 2017-02-10 DIAGNOSIS — R2689 Other abnormalities of gait and mobility: Secondary | ICD-10-CM | POA: Diagnosis not present

## 2017-02-10 DIAGNOSIS — Q828 Other specified congenital malformations of skin: Secondary | ICD-10-CM | POA: Diagnosis not present

## 2017-02-10 DIAGNOSIS — M205X1 Other deformities of toe(s) (acquired), right foot: Secondary | ICD-10-CM | POA: Diagnosis not present

## 2017-02-10 DIAGNOSIS — M2011 Hallux valgus (acquired), right foot: Secondary | ICD-10-CM | POA: Diagnosis not present

## 2017-02-16 DIAGNOSIS — M205X1 Other deformities of toe(s) (acquired), right foot: Secondary | ICD-10-CM | POA: Diagnosis not present

## 2017-02-16 DIAGNOSIS — Q828 Other specified congenital malformations of skin: Secondary | ICD-10-CM | POA: Diagnosis not present

## 2017-02-16 DIAGNOSIS — R2689 Other abnormalities of gait and mobility: Secondary | ICD-10-CM | POA: Diagnosis not present

## 2017-02-16 DIAGNOSIS — M2011 Hallux valgus (acquired), right foot: Secondary | ICD-10-CM | POA: Diagnosis not present

## 2017-02-20 DIAGNOSIS — E1142 Type 2 diabetes mellitus with diabetic polyneuropathy: Secondary | ICD-10-CM | POA: Diagnosis not present

## 2017-02-20 DIAGNOSIS — E7800 Pure hypercholesterolemia, unspecified: Secondary | ICD-10-CM | POA: Diagnosis not present

## 2017-02-20 DIAGNOSIS — Z0000 Encounter for general adult medical examination without abnormal findings: Secondary | ICD-10-CM | POA: Diagnosis not present

## 2017-02-21 DIAGNOSIS — E871 Hypo-osmolality and hyponatremia: Secondary | ICD-10-CM | POA: Insufficient documentation

## 2017-02-23 DIAGNOSIS — M205X2 Other deformities of toe(s) (acquired), left foot: Secondary | ICD-10-CM | POA: Diagnosis not present

## 2017-02-23 DIAGNOSIS — M2042 Other hammer toe(s) (acquired), left foot: Secondary | ICD-10-CM | POA: Diagnosis not present

## 2017-02-23 DIAGNOSIS — M205X1 Other deformities of toe(s) (acquired), right foot: Secondary | ICD-10-CM | POA: Diagnosis not present

## 2017-02-23 DIAGNOSIS — M2041 Other hammer toe(s) (acquired), right foot: Secondary | ICD-10-CM | POA: Diagnosis not present

## 2017-03-17 DIAGNOSIS — R269 Unspecified abnormalities of gait and mobility: Secondary | ICD-10-CM | POA: Diagnosis not present

## 2017-03-17 DIAGNOSIS — I10 Essential (primary) hypertension: Secondary | ICD-10-CM | POA: Diagnosis not present

## 2017-03-17 DIAGNOSIS — Z79899 Other long term (current) drug therapy: Secondary | ICD-10-CM | POA: Diagnosis not present

## 2017-03-17 DIAGNOSIS — E7800 Pure hypercholesterolemia, unspecified: Secondary | ICD-10-CM | POA: Diagnosis not present

## 2017-04-14 DIAGNOSIS — R2689 Other abnormalities of gait and mobility: Secondary | ICD-10-CM | POA: Diagnosis not present

## 2017-04-14 DIAGNOSIS — M62551 Muscle wasting and atrophy, not elsewhere classified, right thigh: Secondary | ICD-10-CM | POA: Diagnosis not present

## 2017-04-14 DIAGNOSIS — M62552 Muscle wasting and atrophy, not elsewhere classified, left thigh: Secondary | ICD-10-CM | POA: Diagnosis not present

## 2017-04-17 DIAGNOSIS — R2689 Other abnormalities of gait and mobility: Secondary | ICD-10-CM | POA: Diagnosis not present

## 2017-04-17 DIAGNOSIS — M62551 Muscle wasting and atrophy, not elsewhere classified, right thigh: Secondary | ICD-10-CM | POA: Diagnosis not present

## 2017-04-17 DIAGNOSIS — M62552 Muscle wasting and atrophy, not elsewhere classified, left thigh: Secondary | ICD-10-CM | POA: Diagnosis not present

## 2017-04-20 DIAGNOSIS — M62551 Muscle wasting and atrophy, not elsewhere classified, right thigh: Secondary | ICD-10-CM | POA: Diagnosis not present

## 2017-04-20 DIAGNOSIS — M62552 Muscle wasting and atrophy, not elsewhere classified, left thigh: Secondary | ICD-10-CM | POA: Diagnosis not present

## 2017-04-20 DIAGNOSIS — R2689 Other abnormalities of gait and mobility: Secondary | ICD-10-CM | POA: Diagnosis not present

## 2017-04-24 DIAGNOSIS — M62552 Muscle wasting and atrophy, not elsewhere classified, left thigh: Secondary | ICD-10-CM | POA: Diagnosis not present

## 2017-04-24 DIAGNOSIS — M62551 Muscle wasting and atrophy, not elsewhere classified, right thigh: Secondary | ICD-10-CM | POA: Diagnosis not present

## 2017-04-24 DIAGNOSIS — R2689 Other abnormalities of gait and mobility: Secondary | ICD-10-CM | POA: Diagnosis not present

## 2017-04-28 DIAGNOSIS — M62552 Muscle wasting and atrophy, not elsewhere classified, left thigh: Secondary | ICD-10-CM | POA: Diagnosis not present

## 2017-04-28 DIAGNOSIS — M62551 Muscle wasting and atrophy, not elsewhere classified, right thigh: Secondary | ICD-10-CM | POA: Diagnosis not present

## 2017-04-28 DIAGNOSIS — R2689 Other abnormalities of gait and mobility: Secondary | ICD-10-CM | POA: Diagnosis not present

## 2017-05-01 DIAGNOSIS — M62552 Muscle wasting and atrophy, not elsewhere classified, left thigh: Secondary | ICD-10-CM | POA: Diagnosis not present

## 2017-05-01 DIAGNOSIS — M62551 Muscle wasting and atrophy, not elsewhere classified, right thigh: Secondary | ICD-10-CM | POA: Diagnosis not present

## 2017-05-01 DIAGNOSIS — R2689 Other abnormalities of gait and mobility: Secondary | ICD-10-CM | POA: Diagnosis not present

## 2017-05-04 DIAGNOSIS — R2689 Other abnormalities of gait and mobility: Secondary | ICD-10-CM | POA: Diagnosis not present

## 2017-05-04 DIAGNOSIS — M62552 Muscle wasting and atrophy, not elsewhere classified, left thigh: Secondary | ICD-10-CM | POA: Diagnosis not present

## 2017-05-04 DIAGNOSIS — M62551 Muscle wasting and atrophy, not elsewhere classified, right thigh: Secondary | ICD-10-CM | POA: Diagnosis not present

## 2017-05-08 DIAGNOSIS — R2689 Other abnormalities of gait and mobility: Secondary | ICD-10-CM | POA: Diagnosis not present

## 2017-05-08 DIAGNOSIS — M62551 Muscle wasting and atrophy, not elsewhere classified, right thigh: Secondary | ICD-10-CM | POA: Diagnosis not present

## 2017-05-08 DIAGNOSIS — M62552 Muscle wasting and atrophy, not elsewhere classified, left thigh: Secondary | ICD-10-CM | POA: Diagnosis not present

## 2017-05-11 DIAGNOSIS — M62551 Muscle wasting and atrophy, not elsewhere classified, right thigh: Secondary | ICD-10-CM | POA: Diagnosis not present

## 2017-05-11 DIAGNOSIS — M62552 Muscle wasting and atrophy, not elsewhere classified, left thigh: Secondary | ICD-10-CM | POA: Diagnosis not present

## 2017-05-11 DIAGNOSIS — R2689 Other abnormalities of gait and mobility: Secondary | ICD-10-CM | POA: Diagnosis not present

## 2017-05-15 DIAGNOSIS — R2689 Other abnormalities of gait and mobility: Secondary | ICD-10-CM | POA: Diagnosis not present

## 2017-05-15 DIAGNOSIS — M62552 Muscle wasting and atrophy, not elsewhere classified, left thigh: Secondary | ICD-10-CM | POA: Diagnosis not present

## 2017-05-15 DIAGNOSIS — M62551 Muscle wasting and atrophy, not elsewhere classified, right thigh: Secondary | ICD-10-CM | POA: Diagnosis not present

## 2017-05-22 DIAGNOSIS — M62551 Muscle wasting and atrophy, not elsewhere classified, right thigh: Secondary | ICD-10-CM | POA: Diagnosis not present

## 2017-05-22 DIAGNOSIS — R2689 Other abnormalities of gait and mobility: Secondary | ICD-10-CM | POA: Diagnosis not present

## 2017-05-22 DIAGNOSIS — M62552 Muscle wasting and atrophy, not elsewhere classified, left thigh: Secondary | ICD-10-CM | POA: Diagnosis not present

## 2017-05-25 DIAGNOSIS — M62551 Muscle wasting and atrophy, not elsewhere classified, right thigh: Secondary | ICD-10-CM | POA: Diagnosis not present

## 2017-05-25 DIAGNOSIS — R2689 Other abnormalities of gait and mobility: Secondary | ICD-10-CM | POA: Diagnosis not present

## 2017-05-25 DIAGNOSIS — M62552 Muscle wasting and atrophy, not elsewhere classified, left thigh: Secondary | ICD-10-CM | POA: Diagnosis not present

## 2017-06-02 DIAGNOSIS — M150 Primary generalized (osteo)arthritis: Secondary | ICD-10-CM | POA: Diagnosis not present

## 2017-06-02 DIAGNOSIS — R269 Unspecified abnormalities of gait and mobility: Secondary | ICD-10-CM | POA: Diagnosis not present

## 2017-06-02 DIAGNOSIS — Z23 Encounter for immunization: Secondary | ICD-10-CM | POA: Diagnosis not present

## 2017-06-02 DIAGNOSIS — G629 Polyneuropathy, unspecified: Secondary | ICD-10-CM | POA: Diagnosis not present

## 2017-06-02 DIAGNOSIS — I10 Essential (primary) hypertension: Secondary | ICD-10-CM | POA: Diagnosis not present

## 2017-06-24 DIAGNOSIS — M62552 Muscle wasting and atrophy, not elsewhere classified, left thigh: Secondary | ICD-10-CM | POA: Diagnosis not present

## 2017-06-24 DIAGNOSIS — M62551 Muscle wasting and atrophy, not elsewhere classified, right thigh: Secondary | ICD-10-CM | POA: Diagnosis not present

## 2017-06-24 DIAGNOSIS — R2689 Other abnormalities of gait and mobility: Secondary | ICD-10-CM | POA: Diagnosis not present

## 2017-06-29 DIAGNOSIS — R2689 Other abnormalities of gait and mobility: Secondary | ICD-10-CM | POA: Diagnosis not present

## 2017-06-29 DIAGNOSIS — M62552 Muscle wasting and atrophy, not elsewhere classified, left thigh: Secondary | ICD-10-CM | POA: Diagnosis not present

## 2017-06-29 DIAGNOSIS — M62551 Muscle wasting and atrophy, not elsewhere classified, right thigh: Secondary | ICD-10-CM | POA: Diagnosis not present

## 2017-06-30 DIAGNOSIS — H2513 Age-related nuclear cataract, bilateral: Secondary | ICD-10-CM | POA: Diagnosis not present

## 2017-07-27 DIAGNOSIS — M62552 Muscle wasting and atrophy, not elsewhere classified, left thigh: Secondary | ICD-10-CM | POA: Diagnosis not present

## 2017-07-27 DIAGNOSIS — R2689 Other abnormalities of gait and mobility: Secondary | ICD-10-CM | POA: Diagnosis not present

## 2017-07-27 DIAGNOSIS — M62551 Muscle wasting and atrophy, not elsewhere classified, right thigh: Secondary | ICD-10-CM | POA: Diagnosis not present

## 2017-08-03 DIAGNOSIS — I10 Essential (primary) hypertension: Secondary | ICD-10-CM | POA: Diagnosis not present

## 2017-08-03 DIAGNOSIS — K219 Gastro-esophageal reflux disease without esophagitis: Secondary | ICD-10-CM | POA: Diagnosis not present

## 2017-08-03 DIAGNOSIS — N3941 Urge incontinence: Secondary | ICD-10-CM | POA: Diagnosis not present

## 2017-08-03 DIAGNOSIS — E782 Mixed hyperlipidemia: Secondary | ICD-10-CM | POA: Diagnosis not present

## 2017-08-17 ENCOUNTER — Encounter: Payer: Self-pay | Admitting: Podiatry

## 2017-08-17 ENCOUNTER — Ambulatory Visit (INDEPENDENT_AMBULATORY_CARE_PROVIDER_SITE_OTHER): Payer: PPO | Admitting: Podiatry

## 2017-08-17 VITALS — Ht 62.0 in | Wt 130.0 lb

## 2017-08-17 DIAGNOSIS — M2042 Other hammer toe(s) (acquired), left foot: Secondary | ICD-10-CM

## 2017-08-17 DIAGNOSIS — M7750 Other enthesopathy of unspecified foot: Secondary | ICD-10-CM | POA: Diagnosis not present

## 2017-08-17 DIAGNOSIS — M2010 Hallux valgus (acquired), unspecified foot: Secondary | ICD-10-CM | POA: Diagnosis not present

## 2017-08-17 DIAGNOSIS — M21619 Bunion of unspecified foot: Secondary | ICD-10-CM

## 2017-08-17 DIAGNOSIS — M2041 Other hammer toe(s) (acquired), right foot: Secondary | ICD-10-CM | POA: Diagnosis not present

## 2017-08-17 DIAGNOSIS — M7742 Metatarsalgia, left foot: Secondary | ICD-10-CM

## 2017-08-17 DIAGNOSIS — M7741 Metatarsalgia, right foot: Secondary | ICD-10-CM

## 2017-08-17 DIAGNOSIS — M779 Enthesopathy, unspecified: Secondary | ICD-10-CM

## 2017-08-17 NOTE — Progress Notes (Signed)
  Subjective:  Patient ID: Sandra Lynch, female    DOB: 1935/06/27,  MRN: [id number]  Chief Complaint  Patient presents with  . Callouses    painful calluses sub met 83m & 3rd bilateral   . Hammer Toe    Hammer toes has already been told surgery is not an option by someone in [place]    83y.o. female presents with the above complaint. Presents with painful calluses underneath the ball complains of hammertoes and states that she was told she was not a candidate for surgery.  Current Outpatient Medications:  .  amLODipine (NORVASC) 5 MG tablet, Take 5 mg by mouth daily., Disp: , Rfl: 3 .  aspirin EC 81 MG tablet, Take by mouth., Disp: , Rfl:  .  calcium citrate-vitamin D 500-400 MG-UNIT chewable tablet, Chew by mouth., Disp: , Rfl:  .  DOCOSAHEXAENOIC ACID PO, Take 1 g by mouth., Disp: , Rfl:  .  ibuprofen (ADVIL,MOTRIN) 200 MG tablet, Take by mouth., Disp: , Rfl:  .  Multiple Vitamins-Minerals (CENTRUM SILVER 50+WOMEN PO), Take by mouth., Disp: , Rfl:  .  MYRBETRIQ 25 MG TB24 tablet, Take 25 mg by mouth daily., Disp: , Rfl: 4 .  omeprazole (PRILOSEC) 20 MG capsule, Take by mouth., Disp: , Rfl:  .  pravastatin (PRAVACHOL) 40 MG tablet, Take 40 mg by mouth at bedtime., Disp: , Rfl: 3 .  ranitidine (ZANTAC) 150 MG capsule, Take by mouth., Disp: , Rfl:  .  valsartan (DIOVAN) 40 MG tablet, Take 40 mg by mouth daily. for blood pressure, Disp: , Rfl: 0  Not on File Review of Systems Objective:  There were no vitals filed for this visit. General AA&O x3. Normal mood and affect.  Vascular Dorsalis pedis and posterior tibial pulses  present 2+ bilaterally  Capillary refill normal to all digits. Pedal hair growth normal.  Neurologic Epicritic sensation grossly present.  Dermatologic No open lesions. Interspaces clear of maceration. Nails well groomed and normal in appearance.  Orthopedic: MMT 5/5 in dorsiflexion, plantarflexion, inversion, and eversion. Normal joint ROM without  pain or crepitus. HAV deformity bilateral, hammertoe formation bilaterally, crossover toe deformity, pain to palpation of second third metatarsal heads plantarly   Assessment & Plan:  Patient was evaluated and treated and all questions answered.  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 Will proceed with conservative therapy at this time. -Powersteps dispensed. Offloaded with metatarsal pads  Return in about 4 weeks (around 09/14/2017) for Capsulitis.

## 2017-09-14 DIAGNOSIS — M62551 Muscle wasting and atrophy, not elsewhere classified, right thigh: Secondary | ICD-10-CM | POA: Diagnosis not present

## 2017-09-14 DIAGNOSIS — M62552 Muscle wasting and atrophy, not elsewhere classified, left thigh: Secondary | ICD-10-CM | POA: Diagnosis not present

## 2017-09-14 DIAGNOSIS — R2689 Other abnormalities of gait and mobility: Secondary | ICD-10-CM | POA: Diagnosis not present

## 2017-09-15 ENCOUNTER — Ambulatory Visit: Payer: PPO | Admitting: Podiatry

## 2017-09-18 DIAGNOSIS — M62552 Muscle wasting and atrophy, not elsewhere classified, left thigh: Secondary | ICD-10-CM | POA: Diagnosis not present

## 2017-09-18 DIAGNOSIS — R2689 Other abnormalities of gait and mobility: Secondary | ICD-10-CM | POA: Diagnosis not present

## 2017-09-18 DIAGNOSIS — M62551 Muscle wasting and atrophy, not elsewhere classified, right thigh: Secondary | ICD-10-CM | POA: Diagnosis not present

## 2017-09-21 DIAGNOSIS — M62552 Muscle wasting and atrophy, not elsewhere classified, left thigh: Secondary | ICD-10-CM | POA: Diagnosis not present

## 2017-09-21 DIAGNOSIS — M62551 Muscle wasting and atrophy, not elsewhere classified, right thigh: Secondary | ICD-10-CM | POA: Diagnosis not present

## 2017-09-21 DIAGNOSIS — R2689 Other abnormalities of gait and mobility: Secondary | ICD-10-CM | POA: Diagnosis not present

## 2017-09-22 ENCOUNTER — Ambulatory Visit (INDEPENDENT_AMBULATORY_CARE_PROVIDER_SITE_OTHER): Payer: PPO | Admitting: Podiatry

## 2017-09-22 DIAGNOSIS — M2041 Other hammer toe(s) (acquired), right foot: Secondary | ICD-10-CM | POA: Diagnosis not present

## 2017-09-22 DIAGNOSIS — M2010 Hallux valgus (acquired), unspecified foot: Secondary | ICD-10-CM

## 2017-09-22 DIAGNOSIS — M2042 Other hammer toe(s) (acquired), left foot: Secondary | ICD-10-CM | POA: Diagnosis not present

## 2017-09-22 DIAGNOSIS — M21619 Bunion of unspecified foot: Secondary | ICD-10-CM | POA: Diagnosis not present

## 2017-09-22 NOTE — Progress Notes (Signed)
  Subjective:  Patient ID: Sandra Lynch, female    DOB: 04-17-1935,  MRN: [id number]  Chief Complaint  Patient presents with  . Foot Pain    fu bilateral forefoot pain   seems to be some better    83 y.o. female returns for the above complaint. States that it is some better. States the inserts she bought feel too thick due to the added padding.   Objective:  There were no vitals filed for this visit. General AA&O x3. Normal mood and affect.  Vascular Pedal pulses palpable.  Neurologic Epicritic sensation grossly intact.  Dermatologic No open lesions. Skin normal texture and turgor.  Orthopedic: Severe HAV, HTs bilat   Assessment & Plan:  Patient was evaluated and treated and all questions answered.  Metatarsalgia, Capsulitis -Powerstep insole pads removed, offloaded with Plastizote. Patient reported relief while ambulating. -F/u PRN for modification.  No Follow-up on file.

## 2017-09-25 DIAGNOSIS — M62552 Muscle wasting and atrophy, not elsewhere classified, left thigh: Secondary | ICD-10-CM | POA: Diagnosis not present

## 2017-09-25 DIAGNOSIS — R2689 Other abnormalities of gait and mobility: Secondary | ICD-10-CM | POA: Diagnosis not present

## 2017-09-25 DIAGNOSIS — M62551 Muscle wasting and atrophy, not elsewhere classified, right thigh: Secondary | ICD-10-CM | POA: Diagnosis not present

## 2017-10-02 DIAGNOSIS — M62552 Muscle wasting and atrophy, not elsewhere classified, left thigh: Secondary | ICD-10-CM | POA: Diagnosis not present

## 2017-10-02 DIAGNOSIS — R2689 Other abnormalities of gait and mobility: Secondary | ICD-10-CM | POA: Diagnosis not present

## 2017-10-02 DIAGNOSIS — M62551 Muscle wasting and atrophy, not elsewhere classified, right thigh: Secondary | ICD-10-CM | POA: Diagnosis not present

## 2017-10-05 DIAGNOSIS — R2689 Other abnormalities of gait and mobility: Secondary | ICD-10-CM | POA: Diagnosis not present

## 2017-10-05 DIAGNOSIS — M62551 Muscle wasting and atrophy, not elsewhere classified, right thigh: Secondary | ICD-10-CM | POA: Diagnosis not present

## 2017-10-05 DIAGNOSIS — M62552 Muscle wasting and atrophy, not elsewhere classified, left thigh: Secondary | ICD-10-CM | POA: Diagnosis not present

## 2017-10-09 DIAGNOSIS — M62552 Muscle wasting and atrophy, not elsewhere classified, left thigh: Secondary | ICD-10-CM | POA: Diagnosis not present

## 2017-10-09 DIAGNOSIS — R2689 Other abnormalities of gait and mobility: Secondary | ICD-10-CM | POA: Diagnosis not present

## 2017-10-09 DIAGNOSIS — M62551 Muscle wasting and atrophy, not elsewhere classified, right thigh: Secondary | ICD-10-CM | POA: Diagnosis not present

## 2017-10-16 DIAGNOSIS — M62551 Muscle wasting and atrophy, not elsewhere classified, right thigh: Secondary | ICD-10-CM | POA: Diagnosis not present

## 2017-10-16 DIAGNOSIS — M62552 Muscle wasting and atrophy, not elsewhere classified, left thigh: Secondary | ICD-10-CM | POA: Diagnosis not present

## 2017-10-16 DIAGNOSIS — R2689 Other abnormalities of gait and mobility: Secondary | ICD-10-CM | POA: Diagnosis not present

## 2017-10-19 DIAGNOSIS — M62551 Muscle wasting and atrophy, not elsewhere classified, right thigh: Secondary | ICD-10-CM | POA: Diagnosis not present

## 2017-10-19 DIAGNOSIS — R2689 Other abnormalities of gait and mobility: Secondary | ICD-10-CM | POA: Diagnosis not present

## 2017-10-19 DIAGNOSIS — M62552 Muscle wasting and atrophy, not elsewhere classified, left thigh: Secondary | ICD-10-CM | POA: Diagnosis not present

## 2017-10-23 DIAGNOSIS — R2689 Other abnormalities of gait and mobility: Secondary | ICD-10-CM | POA: Diagnosis not present

## 2017-10-23 DIAGNOSIS — M62551 Muscle wasting and atrophy, not elsewhere classified, right thigh: Secondary | ICD-10-CM | POA: Diagnosis not present

## 2017-10-23 DIAGNOSIS — M62552 Muscle wasting and atrophy, not elsewhere classified, left thigh: Secondary | ICD-10-CM | POA: Diagnosis not present

## 2017-11-02 DIAGNOSIS — R2689 Other abnormalities of gait and mobility: Secondary | ICD-10-CM | POA: Diagnosis not present

## 2017-11-02 DIAGNOSIS — M62552 Muscle wasting and atrophy, not elsewhere classified, left thigh: Secondary | ICD-10-CM | POA: Diagnosis not present

## 2017-11-02 DIAGNOSIS — M62551 Muscle wasting and atrophy, not elsewhere classified, right thigh: Secondary | ICD-10-CM | POA: Diagnosis not present

## 2017-11-06 DIAGNOSIS — M62552 Muscle wasting and atrophy, not elsewhere classified, left thigh: Secondary | ICD-10-CM | POA: Diagnosis not present

## 2017-11-06 DIAGNOSIS — R2689 Other abnormalities of gait and mobility: Secondary | ICD-10-CM | POA: Diagnosis not present

## 2017-11-06 DIAGNOSIS — M62551 Muscle wasting and atrophy, not elsewhere classified, right thigh: Secondary | ICD-10-CM | POA: Diagnosis not present

## 2017-11-16 DIAGNOSIS — M62551 Muscle wasting and atrophy, not elsewhere classified, right thigh: Secondary | ICD-10-CM | POA: Diagnosis not present

## 2017-11-16 DIAGNOSIS — R2689 Other abnormalities of gait and mobility: Secondary | ICD-10-CM | POA: Diagnosis not present

## 2017-11-16 DIAGNOSIS — M62552 Muscle wasting and atrophy, not elsewhere classified, left thigh: Secondary | ICD-10-CM | POA: Diagnosis not present

## 2017-11-20 DIAGNOSIS — M62551 Muscle wasting and atrophy, not elsewhere classified, right thigh: Secondary | ICD-10-CM | POA: Diagnosis not present

## 2017-11-20 DIAGNOSIS — R2689 Other abnormalities of gait and mobility: Secondary | ICD-10-CM | POA: Diagnosis not present

## 2017-11-20 DIAGNOSIS — M62552 Muscle wasting and atrophy, not elsewhere classified, left thigh: Secondary | ICD-10-CM | POA: Diagnosis not present

## 2017-11-23 DIAGNOSIS — R2689 Other abnormalities of gait and mobility: Secondary | ICD-10-CM | POA: Diagnosis not present

## 2017-11-23 DIAGNOSIS — M62551 Muscle wasting and atrophy, not elsewhere classified, right thigh: Secondary | ICD-10-CM | POA: Diagnosis not present

## 2017-11-23 DIAGNOSIS — M62552 Muscle wasting and atrophy, not elsewhere classified, left thigh: Secondary | ICD-10-CM | POA: Diagnosis not present

## 2017-11-27 DIAGNOSIS — R2689 Other abnormalities of gait and mobility: Secondary | ICD-10-CM | POA: Diagnosis not present

## 2017-11-27 DIAGNOSIS — M62551 Muscle wasting and atrophy, not elsewhere classified, right thigh: Secondary | ICD-10-CM | POA: Diagnosis not present

## 2017-11-27 DIAGNOSIS — M62552 Muscle wasting and atrophy, not elsewhere classified, left thigh: Secondary | ICD-10-CM | POA: Diagnosis not present

## 2017-12-02 DIAGNOSIS — K219 Gastro-esophageal reflux disease without esophagitis: Secondary | ICD-10-CM | POA: Diagnosis not present

## 2017-12-02 DIAGNOSIS — J301 Allergic rhinitis due to pollen: Secondary | ICD-10-CM | POA: Diagnosis not present

## 2017-12-02 DIAGNOSIS — E782 Mixed hyperlipidemia: Secondary | ICD-10-CM | POA: Diagnosis not present

## 2017-12-02 DIAGNOSIS — N3941 Urge incontinence: Secondary | ICD-10-CM | POA: Diagnosis not present

## 2017-12-02 DIAGNOSIS — I10 Essential (primary) hypertension: Secondary | ICD-10-CM | POA: Diagnosis not present

## 2017-12-04 DIAGNOSIS — M62551 Muscle wasting and atrophy, not elsewhere classified, right thigh: Secondary | ICD-10-CM | POA: Diagnosis not present

## 2017-12-04 DIAGNOSIS — M62552 Muscle wasting and atrophy, not elsewhere classified, left thigh: Secondary | ICD-10-CM | POA: Diagnosis not present

## 2017-12-04 DIAGNOSIS — R2689 Other abnormalities of gait and mobility: Secondary | ICD-10-CM | POA: Diagnosis not present

## 2017-12-09 DIAGNOSIS — M62552 Muscle wasting and atrophy, not elsewhere classified, left thigh: Secondary | ICD-10-CM | POA: Diagnosis not present

## 2017-12-09 DIAGNOSIS — R2689 Other abnormalities of gait and mobility: Secondary | ICD-10-CM | POA: Diagnosis not present

## 2017-12-09 DIAGNOSIS — M62551 Muscle wasting and atrophy, not elsewhere classified, right thigh: Secondary | ICD-10-CM | POA: Diagnosis not present

## 2017-12-14 DIAGNOSIS — R2689 Other abnormalities of gait and mobility: Secondary | ICD-10-CM | POA: Diagnosis not present

## 2017-12-14 DIAGNOSIS — M62551 Muscle wasting and atrophy, not elsewhere classified, right thigh: Secondary | ICD-10-CM | POA: Diagnosis not present

## 2017-12-14 DIAGNOSIS — M62552 Muscle wasting and atrophy, not elsewhere classified, left thigh: Secondary | ICD-10-CM | POA: Diagnosis not present

## 2017-12-16 DIAGNOSIS — R2689 Other abnormalities of gait and mobility: Secondary | ICD-10-CM | POA: Diagnosis not present

## 2017-12-16 DIAGNOSIS — M62552 Muscle wasting and atrophy, not elsewhere classified, left thigh: Secondary | ICD-10-CM | POA: Diagnosis not present

## 2017-12-16 DIAGNOSIS — M62551 Muscle wasting and atrophy, not elsewhere classified, right thigh: Secondary | ICD-10-CM | POA: Diagnosis not present

## 2017-12-28 DIAGNOSIS — R2689 Other abnormalities of gait and mobility: Secondary | ICD-10-CM | POA: Diagnosis not present

## 2017-12-28 DIAGNOSIS — M62552 Muscle wasting and atrophy, not elsewhere classified, left thigh: Secondary | ICD-10-CM | POA: Diagnosis not present

## 2017-12-28 DIAGNOSIS — M62551 Muscle wasting and atrophy, not elsewhere classified, right thigh: Secondary | ICD-10-CM | POA: Diagnosis not present

## 2018-01-04 DIAGNOSIS — Z1231 Encounter for screening mammogram for malignant neoplasm of breast: Secondary | ICD-10-CM | POA: Diagnosis not present

## 2018-01-11 DIAGNOSIS — M62552 Muscle wasting and atrophy, not elsewhere classified, left thigh: Secondary | ICD-10-CM | POA: Diagnosis not present

## 2018-01-11 DIAGNOSIS — M62551 Muscle wasting and atrophy, not elsewhere classified, right thigh: Secondary | ICD-10-CM | POA: Diagnosis not present

## 2018-01-11 DIAGNOSIS — R2689 Other abnormalities of gait and mobility: Secondary | ICD-10-CM | POA: Diagnosis not present

## 2018-01-12 DIAGNOSIS — E781 Pure hyperglyceridemia: Secondary | ICD-10-CM | POA: Diagnosis not present

## 2018-01-12 DIAGNOSIS — E871 Hypo-osmolality and hyponatremia: Secondary | ICD-10-CM | POA: Diagnosis not present

## 2018-01-12 DIAGNOSIS — M2041 Other hammer toe(s) (acquired), right foot: Secondary | ICD-10-CM | POA: Diagnosis not present

## 2018-01-12 DIAGNOSIS — I481 Persistent atrial fibrillation: Secondary | ICD-10-CM | POA: Diagnosis not present

## 2018-01-12 DIAGNOSIS — R05 Cough: Secondary | ICD-10-CM | POA: Diagnosis not present

## 2018-01-12 DIAGNOSIS — M2042 Other hammer toe(s) (acquired), left foot: Secondary | ICD-10-CM | POA: Diagnosis not present

## 2018-01-18 ENCOUNTER — Ambulatory Visit (INDEPENDENT_AMBULATORY_CARE_PROVIDER_SITE_OTHER): Payer: PPO | Admitting: Podiatry

## 2018-01-18 ENCOUNTER — Ambulatory Visit (INDEPENDENT_AMBULATORY_CARE_PROVIDER_SITE_OTHER): Payer: PPO

## 2018-01-18 DIAGNOSIS — M2040 Other hammer toe(s) (acquired), unspecified foot: Secondary | ICD-10-CM | POA: Diagnosis not present

## 2018-01-18 DIAGNOSIS — M779 Enthesopathy, unspecified: Secondary | ICD-10-CM

## 2018-01-18 DIAGNOSIS — M2010 Hallux valgus (acquired), unspecified foot: Secondary | ICD-10-CM

## 2018-01-18 DIAGNOSIS — M2041 Other hammer toe(s) (acquired), right foot: Secondary | ICD-10-CM

## 2018-01-18 DIAGNOSIS — M7742 Metatarsalgia, left foot: Secondary | ICD-10-CM

## 2018-01-18 DIAGNOSIS — M7741 Metatarsalgia, right foot: Secondary | ICD-10-CM

## 2018-01-18 DIAGNOSIS — M21619 Bunion of unspecified foot: Secondary | ICD-10-CM

## 2018-01-18 DIAGNOSIS — M2042 Other hammer toe(s) (acquired), left foot: Secondary | ICD-10-CM

## 2018-01-18 NOTE — Patient Instructions (Signed)
Pre-Operative Instructions  Congratulations, you have decided to take an important step towards improving your quality of life.  You can be assured that the doctors and staff at [facility] will be with you every step of the way.  Here are some important things you should know:  1. Plan to be at the surgery center/hospital at least 1 (one) hour prior to your scheduled time, unless otherwise directed by the surgical center/hospital staff.  You must have a responsible adult accompany you, remain during the surgery and drive you home.  Make sure you have directions to the surgical center/hospital to ensure you arrive on time. 2. If you are having surgery at [facility] or [facility], you will need a copy of your medical history and physical form from your family physician within one month prior to the date of surgery. We will give you a form for your primary physician to complete.  3. We make every effort to accommodate the date you request for surgery.  However, there are times where surgery dates or times have to be moved.  We will contact you as soon as possible if a change in schedule is required.   4. No aspirin/ibuprofen for one week before surgery.  If you are on aspirin, any non-steroidal anti-inflammatory medications (Mobic, Aleve, Ibuprofen) should not be taken seven (7) days prior to your surgery.  You make take Tylenol for pain prior to surgery.  5. Medications - If you are taking daily heart and blood pressure medications, seizure, reflux, allergy, asthma, anxiety, pain or diabetes medications, make sure you notify the surgery center/hospital before the day of surgery so they can tell you which medications you should take or avoid the day of surgery. 6. No food or drink after midnight the night before surgery unless directed otherwise by surgical center/hospital staff. 7. No alcoholic beverages 24-hours prior to surgery.  No smoking 24-hours prior or 24-hours after  surgery. 8. Wear loose pants or shorts. They should be loose enough to fit over bandages, boots, and casts. 9. Don't wear slip-on shoes. Sneakers are preferred. 10. Bring your boot with you to the surgery center/hospital.  Also bring crutches or a walker if your physician has prescribed it for you.  If you do not have this equipment, it will be provided for you after surgery. 11. If you have not been contacted by the surgery center/hospital by the day before your surgery, call to confirm the date and time of your surgery. 12. Leave-time from work may vary depending on the type of surgery you have.  Appropriate arrangements should be made prior to surgery with your employer. 13. Prescriptions will be provided immediately following surgery by your doctor.  Fill these as soon as possible after surgery and take the medication as directed. Pain medications will not be refilled on weekends and must be approved by the doctor. 14. Remove nail polish on the operative foot and avoid getting pedicures prior to surgery. 15. Wash the night before surgery.  The night before surgery wash the foot and leg well with water and the antibacterial soap provided. Be sure to pay special attention to beneath the toenails and in between the toes.  Wash for at least three (3) minutes. Rinse thoroughly with water and dry well with a towel.  Perform this wash unless told not to do so by your physician.  Enclosed: 1 Ice pack (please put in freezer the night before surgery)   1 Hibiclens skin cleaner     Pre-op instructions  If you have any questions regarding the instructions, please do not hesitate to call our office.  [place]: [address], [place], [place] [address] -- [phone]  [place]: [address] [place], [place] [address] -- [phone]  [place]: [address]  [place], [place] [address] -- [phone]  [place]: [address], [address], [place], [place] [address] -- [phone]  Website:  [email or web address] 

## 2018-01-18 NOTE — Progress Notes (Signed)
  Subjective:  Patient ID: Sandra Lynch, female    DOB: September 12, 1934,  MRN: [id number]  Chief Complaint  Patient presents with  . capsulitis    F/U B/L capsulitis Pt. stated," both feet are bad, but the L one is a litte more sore." Tx: insoles (made pain worse), and PT (1 session and did not help)   . Callouses    F/U B/L plantar callus trimming   83 y.o. female returns for the above complaint.  Reports that the feet are hurting to the point where she cannot take the pain anymore.  Objective:  There were no vitals filed for this visit. General AA&O x3. Normal mood and affect.  Vascular Pedal pulses palpable.  Neurologic Epicritic sensation grossly intact.  Dermatologic No open lesions. Skin normal texture and turgor.  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  HAV deformity bilateral, lesser digital contracture, capsulitis: Rheumatoid foot type -Discussed continued conservative versus surgical care of the above deformity.  Patient wishes to proceed with surgical intervention.  All risk benefits alternatives explained to patient no guarantees given.  Patient would benefit from rheumatoid foot type reconstruction including first MPJ arthrodesis, lesser pan metatarsal head resection, hammertoe correction 2 through 5.  Discussed home health care post procedure.  Will need to limit weightbearing to the right lower extremity.  Patient is at risk for falls benefit from home PT. Consent reviewed and signed by patient for the above procedures.  15 minutes of face to face time were spent with the patient. >50% of this was spent on counseling and coordination of care. Specifically discussed with patient the above diagnoses and overall treatment plan.  No follow-ups on file.

## 2018-01-18 NOTE — H&P (View-Only) (Signed)
  Subjective:  Patient ID: Sandra Lynch, female    DOB: Feb 19, 1935,  MRN: [id number]  Chief Complaint  Patient presents with  . capsulitis    F/U B/L capsulitis Pt. stated," both feet are bad, but the L one is a litte more sore." Tx: insoles (made pain worse), and PT (1 session and did not help)   . Callouses    F/U B/L plantar callus trimming   83 y.o. female returns for the above complaint.  Reports that the feet are hurting to the point where she cannot take the pain anymore.  Objective:  There were no vitals filed for this visit. General AA&O x3. Normal mood and affect.  Vascular Pedal pulses palpable.  Neurologic Epicritic sensation grossly intact.  Dermatologic No open lesions. Skin normal texture and turgor.  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  HAV deformity bilateral, lesser digital contracture, capsulitis: Rheumatoid foot type -Discussed continued conservative versus surgical care of the above deformity.  Patient wishes to proceed with surgical intervention.  All risk benefits alternatives explained to patient no guarantees given.  Patient would benefit from rheumatoid foot type reconstruction including first MPJ arthrodesis, lesser pan metatarsal head resection, hammertoe correction 2 through 5.  Discussed home health care post procedure.  Will need to limit weightbearing to the right lower extremity.  Patient is at risk for falls benefit from home PT. Consent reviewed and signed by patient for the above procedures.  15 minutes of face to face time were spent with the patient. >50% of this was spent on counseling and coordination of care. Specifically discussed with patient the above diagnoses and overall treatment plan.  No follow-ups on file.

## 2018-01-19 ENCOUNTER — Other Ambulatory Visit: Payer: Self-pay | Admitting: Podiatry

## 2018-01-19 DIAGNOSIS — M7741 Metatarsalgia, right foot: Secondary | ICD-10-CM

## 2018-01-19 DIAGNOSIS — M2040 Other hammer toe(s) (acquired), unspecified foot: Secondary | ICD-10-CM

## 2018-01-19 DIAGNOSIS — M2010 Hallux valgus (acquired), unspecified foot: Secondary | ICD-10-CM

## 2018-01-19 DIAGNOSIS — M21619 Bunion of unspecified foot: Secondary | ICD-10-CM

## 2018-01-19 DIAGNOSIS — M7742 Metatarsalgia, left foot: Secondary | ICD-10-CM

## 2018-01-19 DIAGNOSIS — M2041 Other hammer toe(s) (acquired), right foot: Secondary | ICD-10-CM

## 2018-01-19 DIAGNOSIS — M779 Enthesopathy, unspecified: Secondary | ICD-10-CM

## 2018-01-19 DIAGNOSIS — M2042 Other hammer toe(s) (acquired), left foot: Secondary | ICD-10-CM

## 2018-01-24 DIAGNOSIS — R0789 Other chest pain: Secondary | ICD-10-CM | POA: Diagnosis not present

## 2018-01-24 DIAGNOSIS — R0602 Shortness of breath: Secondary | ICD-10-CM | POA: Diagnosis not present

## 2018-01-24 DIAGNOSIS — E871 Hypo-osmolality and hyponatremia: Secondary | ICD-10-CM | POA: Diagnosis not present

## 2018-01-24 DIAGNOSIS — K219 Gastro-esophageal reflux disease without esophagitis: Secondary | ICD-10-CM | POA: Diagnosis not present

## 2018-01-24 DIAGNOSIS — I10 Essential (primary) hypertension: Secondary | ICD-10-CM | POA: Diagnosis not present

## 2018-01-24 DIAGNOSIS — R079 Chest pain, unspecified: Secondary | ICD-10-CM | POA: Diagnosis not present

## 2018-01-24 DIAGNOSIS — E785 Hyperlipidemia, unspecified: Secondary | ICD-10-CM | POA: Diagnosis not present

## 2018-01-24 DIAGNOSIS — I4891 Unspecified atrial fibrillation: Secondary | ICD-10-CM | POA: Diagnosis not present

## 2018-01-25 DIAGNOSIS — R079 Chest pain, unspecified: Secondary | ICD-10-CM

## 2018-01-25 DIAGNOSIS — I10 Essential (primary) hypertension: Secondary | ICD-10-CM | POA: Diagnosis not present

## 2018-01-27 ENCOUNTER — Telehealth: Payer: Self-pay | Admitting: *Deleted

## 2018-01-27 ENCOUNTER — Other Ambulatory Visit: Payer: Self-pay

## 2018-01-27 NOTE — Patient Outreach (Signed)
[facility] Care Management  01/27/2018  DAMIYAH DITMARS 03/27/1935 [id number]     Transition of Care Referral  Referral Date: 01/27/18 Referral Source: [facility] Discharge Report Date of Admission: unknown Diagnosis: " chest pain, unspecified" Date of Discharge: 01/25/18 Facility: [facility]: [facility]   Referral received. No outreach warranted at this time. TOC will be completed by primary care provider office who will refer to [facility] care mgmt if needed.     Plan: RN CM will close case at this time.    Enzo Montgomery, RN,BSN,CCM [place] Management Telephonic Care Management Coordinator Direct Phone: [phone] Toll Free: [phone] Fax: [phone]

## 2018-01-27 NOTE — Telephone Encounter (Signed)
"  I've been calling to schedule surgery for my grandmother.  I have called a couple of times and no one has returned my call."  Someone has been calling but they were not leaving the patient's name.  "It was probably me, I'm the only one that has called.  I'm sorry."  Did she receive the history and physical form and has she had a physical completed in the past 30 days?  "Yes, she has seen her primary care doctor.  She just got out of the hospital and they ran all sorts of test.  We have the results of that.  Do I need to send you that as well?"  Yes, that will be great.  Do you have a date in mind for her to have the surgery?  "No, whatever he has available."  I will see when Dr. March Rummage can do it and if they have any time available at the surgery center and give you a call back.  "Okay, that's fine.  Call me because I make all of her arrangements for appointments.  My cell number is [phone]."  I am calling to let you know we have Sandra Lynch scheduled for surgery on 02/11/2018.  "Okay great, I will get those papers to you.  Where do I send it to?"  My fax number is on the bottom of the cover page of the history and physical form.  "Faythe Ghee is it [phone]?"  Yes, that is correct.  "Thank you so much for your help."

## 2018-02-01 DIAGNOSIS — M2041 Other hammer toe(s) (acquired), right foot: Secondary | ICD-10-CM | POA: Diagnosis not present

## 2018-02-01 DIAGNOSIS — F411 Generalized anxiety disorder: Secondary | ICD-10-CM | POA: Diagnosis not present

## 2018-02-02 ENCOUNTER — Telehealth: Payer: Self-pay | Admitting: *Deleted

## 2018-02-02 DIAGNOSIS — M2040 Other hammer toe(s) (acquired), unspecified foot: Secondary | ICD-10-CM

## 2018-02-02 DIAGNOSIS — M7741 Metatarsalgia, right foot: Secondary | ICD-10-CM

## 2018-02-02 DIAGNOSIS — M21619 Bunion of unspecified foot: Secondary | ICD-10-CM

## 2018-02-02 NOTE — Telephone Encounter (Signed)
I am calling you in regards to your surgery that we have scheduled for July 18.  I see you have an appointment with a Cardiologist on July 19 for Atrial Fibrillation.  Do we need to reschedule your surgery date?  "No, they told me all my test were normal.  I don't know of an appointment with a Cardiologist."  Dr. Henrene Pastor referred you to a Cardiologist.  "What's the doctor's name I'm supposed to be seeing?"  You are scheduled to see Dr. Geraldo Pitter at [facility].  "Let me call Dr. Henrene Pastor about that.  He told me everything was good."  Did you get your history and physical form completed by your primary care doctor?  You must have that form filled out by your primary care doctor in order for you to have the surgery.  "I don't know anything about any forms needing to be filled out.  Where do I get those?"  You should have received it when you were at your June 24 appointment.  You can go by our [place] office and pick one up.  Have you seen your primary care doctor in the past 30 days?  "I saw him yesterday.  I'll go by your office tomorrow and pick them up.  Don't cancel my surgery yet.  I'm going to call Dr. Henrene Pastor and see what's going on.   I'll call you back."  "This is [place] calling from [employer].  I am calling about a history and physical form for [facility]."  We have not received a form for her surgery.  "We had sent it previously."  I haven't received it.  Can I fax you another one to have it completed?  "Sure, you can fax it to [phone]."  I will send the form.  Thank you.  I faxed the form to [place].

## 2018-02-03 ENCOUNTER — Telehealth: Payer: Self-pay | Admitting: *Deleted

## 2018-02-03 DIAGNOSIS — M7741 Metatarsalgia, right foot: Secondary | ICD-10-CM

## 2018-02-03 DIAGNOSIS — M2041 Other hammer toe(s) (acquired), right foot: Secondary | ICD-10-CM

## 2018-02-03 DIAGNOSIS — M2010 Hallux valgus (acquired), unspecified foot: Secondary | ICD-10-CM

## 2018-02-03 NOTE — Telephone Encounter (Signed)
"  I'm scheduled to have surgery on July 18.  I am trying to see if you all arrange [facility]."  Yes, we do.  I am working on someone to come out to help you.  I tried [place] but they do not except [employer].  Have you ever used [place] before?  "No, I have never been sick enough to need it.  Will you call me when you find someone?"  Yes, you will get a call from the [place] agency.  "Okay, thank you so much."

## 2018-02-04 ENCOUNTER — Encounter: Payer: Self-pay | Admitting: Cardiology

## 2018-02-04 NOTE — Telephone Encounter (Signed)
I attempted to call [name] to let her know we received the medical records.

## 2018-02-04 NOTE — Telephone Encounter (Signed)
"  This is Thy Gullikson calling about Noralee Dutko.  I'm calling to see if you received the paperwork for [facility], her history and physical forms."  I have not received it.  "Oh my God, I've called them twice already."  They may have called the surgical center.  I will call them to see if they have it.  "I have the paperwork about her heart too.  Do you want me to fax it all to you?"  Yes, that will be fine.  "Do you want me to fax it to [phone]?"  Yes, that will be fine.  "Okay, I'll send it."

## 2018-02-05 NOTE — Telephone Encounter (Signed)
"  I'm calling to see if you got my fax."  Yes, I received it.  I called and left you a message yesterday.  "Okay great, did you ever get the paperwork from her primary?"  No, I did not.  Maybe they sent it to the surgical center.  "I don't know what is going on with them.  I've called twice.  Maybe I need to go over there and get the paperwork myself and just fax it directly to you."  My mother-in-law wants to have someone help her 24 hours a day.  She's asking if she can be placed at [facility]."  I'll have to check with Dr. March Rummage.  Insurances normally require a three day stay then admission into a nursing facility is possible.  "Well can you let me know if you are able to do it?"  "This is Sandra Lynch.  I have not heard what time my surgery is going to be.  I don't know who is going to take care of me after the surgery or anything.  Has anyone contacted [place] to see if I can go there.  I'm 83 years old and I live alone.  I will not be able to take care of myself."  Someone from the surgical center will call you a day or two prior to your surgery.  They will give you your arrival time.  In order for your insurance to cover a stay at a nursing facility, you have to be admitted into the hospital for three days.  Dr. March Rummage said he cannot have you admitted into the hospital.  He said he does not have a medical reason to have you admitted.  "But why not, I've had friends that have had knee and hip surgeries and they were able to stay in the hospital.  What's the difference?"  Those are more severe surgeries.  You will be able to ambulate after you have your surgery.  Those surgeries are more extensive and require rehabilitation.  We can arrange to have a home health care nurse to come out and do a wound assessment and do dressing changes but they do not do overnight stays or long visits.  However, I can have a [facility] person come to your house and assist you with adult daily living  services such as cooking and cleaning.  Your insurance Lynch not cover the Home Care.  "Well that will be fine.  When are they going to call?  How much do they cost?"  I do not know their cost.  Someone will call you from [facility] and someone from [facility].  "Are you going to let Sandra Lynch know?"  Yes, I will let Sandra Lynch know.  I called and gave Sandra Lynch the above information.  She asked that [facility] and [facility] contact her to make the arrangements.  She said she would call her grandmother's doctor on Monday and see if she can get the history and physical forms.  I left a message for Sandra Lynch of [facility].  I was trying to arrange the home care for the adult daily living services.

## 2018-02-08 NOTE — Telephone Encounter (Signed)
"I am Sandra Lynch from [facility], Sandra Lynch gave me your number.  She said you were needing help with a patient."  Yes, I am looking for some Home Care service for Sandra [facility].  Do you accept insurance?  "Yes, we accept most commercial plans and Medicare."  She has [employer].  Where does she live?"  She lives in [place].  Do you service that area?  "I do.  I will need to go out and visit her at her home and see what her needs are going to be.  Can I have her contact information?"  Yes, her phone number is [phone] and her grand-daughter-in-law's name is Sandra Lynch, her phone number is [phone].  You Lynch want to contact Sandra Lynch first, she's taking care of all arrangements.  "I will give them a call and discuss the payment options."   "This is Sandra Lynch.  I am schedule to have surgery on Thursday.  I want to see if Dr. March Rummage can get me into a nursing home after my surgery."  Sandra Lynch said he cannot get you admitted into a nursing home.  You would need to stay in the hospital for three days and he has no reason to admit you.  "I'm 83 years old and I live alone, that's the reason."  Those are not reasons that he can have you admitted into the hospital.  "Well, what am I supposed to do?"  I am on the phone with a lady from [place] as we speak.  I am trying to get someone to come out and provide you with the services you need.  "Okay well please let me know."  I will let you know as well as Sandra Lynch.  "My name is Sandra Lynch.  I'm calling from [facility] on behalf of Sandra Lynch.  She's scheduled for an outpatient surgery on July 18.  Sandra Lynch is concerned that she will not be able to take care of herself after the surgery.  She wants to be admitted into a skilled nursing facility for rehabilitation.  That service is covered under her insurance plan."  It requires a three day inpatient stay at the hospital doesn't it?  "Her particular plan  does not require that. As long as it is authorized, the inpatient stay is not necessary.  I can give you the authorization number if you like."  Yes, please give it to me.  The provider customer service number is [phone].  When you send the request make sure you put  Urgent Status because it takes 14 days for normal processing.  "I'm calling to see if you found out if the surgical Lynch received the paperwork from my grandmother's primary care physician.  I'm sorry to keep calling.  I just want to make sure everything is taken care of because this has been a long process."  I have not received anything yet on her.  I tried to call [facility] Pre-admit testing on Friday.  I have not heard anything from them at this time.  I did try to contact you this morning.  I have two things going on.  I contacted a [place] facility that will be able to do home visits.  I also spoke to a person from [facility] and they said she can get admitted into a home health care facility without a hospital as long as the stay was authorized with [employer].  So, I'm  working on that.  "Okay great, that sounds perfect.  Where are you going to try and get her into?"  I'm going to try for [facility].  "That will be wonderful.  Please let me know as soon as you find out because time is fast approaching."  Yes, I will keep you informed.  "I'm sorry to keep bothering you.  I just spoke to [facility] and she said her surgery is at 7:15 am on Thursday morning.  Is this true?  She sometimes gets things mixed up."  Yes, her surgery starts at 7:15 am but they Lynch want her at 5:15 am.  They normally call a day or two prior to the surgery date.  They will give her the arrival time.  "Okay thank you, I just wanted to make sure."

## 2018-02-08 NOTE — Progress Notes (Addendum)
Anesthesia Chart Review: SAME DAY WORK-UP  Case:  [id number] Date/Time:  02/11/18 0700   Procedures:      PAN METATARSAL HEAD RESECTION (Right )     HAMMER TOE CORRECTION SECOND, THIRD, FOURTH AND FIFTH (Right )     HALLUX MPJ FUSION RIGHT FOOT (Right )   Anesthesia type:  Monitor Anesthesia Care   Pre-op diagnosis:  hallux abducto valgus hammertoe  capsulitis joint   Location:  [facility] OR ROOM 08 / [place] OR   Surgeon:  [name], DPM     Special Needs: Popliteal Block  DISCUSSION: Patient is an 83 year old female scheduled for the above procedure. History includes never smoker, hyponatremia, GERD, HLD, HTN. Based on 01/12/18 office visit from her PCP Dr. Henrene Pastor, recent EKG on that date was concerning for new onset atrial fibrillation, so a cardiology referral was initiated. She is scheduled to see Dr. Sunny Schlein Revankar on 02/12/18 [facility]). In the meantime, she apparently was admitted to [facility] 01/24/18 for chest pain or afib and had a low risk stress test and normal LVEF. I called and spoke with Dr. Henrene Pastor. He is aware of surgery plans. He saw her back for pre-operative H&P on 02/03/18. He is deferring further work-up and treatment recommendations (ie, anticoagulation) to cardiology. He did feel that her fall risk would need to be taken into account if anticoagulation was being considered. He would defer to anesthesiologist if they felt patient would need to see Dr. Geraldo Pitter prior to surgery, but did note that finding of afib/PAF was an incidental finding on her 01/12/18 exam that showed an irregular heart rhythm. Patient was not having symptoms of chest pain, SOB, or edema at that time. Of note, 01/12/18 EKG tracing from [facility] did appear to have an underlying rhythm of sinus, but did have PACs and a burst of afib (versus run of consecutive PACs). During her 01/25/18 stress test her baseline EKG was also noted to have bursts of afib (PAF). Heart rate has been controlled.    Discussed above with anesthesiologist Dr. Josephine Igo. Patient with diagnosis of PAF last month, but with normal EF and recent non-ischemic stress test. MAC with regional anesthesia is anticipated for planned surgery. Currently, she is a same day work-up. She will need labs prior to surgery. Will not plan to repeat EKG since known diagnosis of PAF. If labs acceptable, vitals stable, and patient remains asymptomatic then it is anticipated that she can proceed as planned and out-patient cardiology follow-up. A PAT RN will contact patient to review history and assess for any new changes in her symptomology. If no changes then an anesthesiologist will evaluate on the day of surgery.    VS: There were no vitals taken for this visit. Vitals to be done on the day of surgery.   PROVIDERS: Lillard Anes, MD is PCP at [facility].   LABS:  Labs to be done on the day of surgery and reviewed by her anesthesiologist.   IMAGES: CXR 01/24/18 [facility]; copy found in PACS): IMPRESSION:  Stable cardiomegaly with minimal aortic atherosclerosis.  No active pulmonary disease.  Osteoarthritis of the glenohumeral joints, left worse than right.  EKG: 01/12/18 ([facility]): Per Dr. Henrene Pastor, "rhythm is sustained atrial fibrillation; no ectopy noted; approximate-21 degree axis; nonspecific ST-T changes". Dr. Royce Macadamia and I also reviewed the 01/12/18 tracing from [facility]. As above, underlying/predominant rhythm appears to be sinus rhythm with PACs and a run of consecutive  PACs versus burst of afib.   CV:  Nuclear stress test 01/25/18 [facility]; copy found in PACS):  IMPRESSION: 1.  No reversible perfusion defect.  There is a fixed defect of moderate size of the septal wall from mid ventricle to base. 2.  Hypokinetic septal wall from mid ventricle to base. 3. Left ventricular ejection fraction 50%. 4.  Noninvasive risk stratification: Low. Resting EKG showed:  Paroxysmal A. Fib (with occasional sinus beats).  Echo 01/24/18: Conclusions:  1. Overall left ventricular systolic function is normal with an EF between 55-60%. 2. The diastolic filling pattern indicates impaired relaxation. 3. The left atrium is mildly dilated. 4. There is trace to mild mitral regurgitation. 5.  Trace tricuspid regurgitation present. 6.  There is no evidence of pulmonary hypertension. 7.  There is no pericardial effusion.  Past Medical History:  Diagnosis Date  . Allergic rhinitis   . Atrial fibrillation ([place])   . Cough   . Essential hypertension   . GERD (gastroesophageal reflux disease)   . Hyperlipidemia   . Hyponatremia   . Urge incontinence     MEDICATIONS: No current facility-administered medications for this encounter.    Marland Kitchen ALPRAZolam (XANAX) 0.25 MG tablet  . amLODipine (NORVASC) 5 MG tablet  . aspirin EC 81 MG tablet  . Calcium Carbonate-Vitamin D (CALTRATE 600+D PO)  . fluticasone (FLONASE) 50 MCG/ACT nasal spray  . Multiple Vitamins-Minerals (CENTRUM SILVER 50+WOMEN PO)  . MYRBETRIQ 25 MG TB24 tablet  . Omega-3 Fatty Acids (FISH OIL) 1000 MG CAPS  . omeprazole (PRILOSEC) 20 MG capsule  . pravastatin (PRAVACHOL) 40 MG tablet  . sodium chloride (OCEAN) 0.65 % SOLN nasal spray  . valsartan (DIOVAN) 40 MG tablet  . ibuprofen (ADVIL,MOTRIN) 200 MG tablet   [facility] Short Stay Center/Anesthesiology Phone [phone] 02/09/2018 12:39 PM

## 2018-02-08 NOTE — Telephone Encounter (Signed)
I called [facility] and spoke to Sandra Lynch in Admissions.  She asked me to send her the procedures that Sandra Lynch was having, clinicals, insurance information, and demographics.  She said the day of surgery she would be in contact with the assigned Social Worker at the surgery center.  She said that a hospital stay will not be needed.  She said she will have someone check insurance and they would contact Sandra Lynch and [employer].  I called and informed Sandra Lynch and [employer] about my conversation with Sandra Lynch at [employer].  I told them that she would be in contact with them about rehabilitation stay.

## 2018-02-09 ENCOUNTER — Encounter (HOSPITAL_COMMUNITY): Payer: Self-pay | Admitting: Vascular Surgery

## 2018-02-10 ENCOUNTER — Telehealth: Payer: Self-pay | Admitting: *Deleted

## 2018-02-10 ENCOUNTER — Encounter (HOSPITAL_COMMUNITY): Payer: Self-pay | Admitting: *Deleted

## 2018-02-10 ENCOUNTER — Other Ambulatory Visit: Payer: Self-pay

## 2018-02-10 NOTE — Telephone Encounter (Signed)
"  I'm calling to follow up on [facility].  She's scheduled for surgery on tomorrow.  What's the protocol.  Do you make arrangements for her to get here?  Do we need to talk to someone inpatient before she leaves?"  She's not going to be inpatient.  I am assuming her grand-daughter in-law will transport her to you all because, someone has to stay at the facility while she has the surgery.  "I have never experienced this process.  How do you all normally handle making arrangements?"  Normally the patient has to have a three day hospital stay before getting transferred to you all.  "That's what I thought.  This insurance is different.  So, the patient's family member will bring her here after her surgery tomorrow.  We'll have everything in place for her."

## 2018-02-10 NOTE — Telephone Encounter (Signed)
"  Where do we enter the hospital when we take Sandra Lynch for her surgery?"  You can go to the main entrance and go the the information desk and they can direct you on where to go.

## 2018-02-10 NOTE — Telephone Encounter (Signed)
"  I'm calling from [facility].  I am the Admissions Director.  We have contacted [employer] and they have informed us that in order for the patient to be admitted here, she will need a PT evaluation.  I know her surgery is tomorrow.  Is there anyway you can help [place] with this?  Normally is she is admitted, all this would have been initiated."  We can have her do a 23 hour short stay at the hospital and she can be evaluated their by a PT.  Will this be okay?  "Yes, that will be great.  We can proceed from there."  I'll let Dr. March Rummage know.  I called the surgical scheduler and asked Rogelio Seen to put Ms. Crimi down for a 23 hour short stay after her surgery.  I called and informed [name] of the change in plans.  She said she would let her grandmother know.

## 2018-02-10 NOTE — Progress Notes (Signed)
Spoke with pt's granddaughter-in-law, [name] for pre-op call. DPR on file. Pt has hx of A-fib. H&P in chart with EKG, stress test and Echo. [name] states no one instructed pt to stop Aspirin. States pt is not diabetic.

## 2018-02-10 NOTE — Telephone Encounter (Addendum)
"  I am calling on behalf of [employer] the surgery for Sandra Lynch has not been approved.  I have to talk to a medical doctor about a procedure that we are not sure how to code.  I just received the information.  If I had known before then I would have called you.  The clinicals were not sent until 02/05/2018."  "I am calling you back.  We may get an approval this evening.  Who can I contact if it's approved?"  You can contact Dr. March Rummage.  His phone number is [phone].  "Okay, we'll keep him informed."  "I am calling to let you know I ran into Dr. Mariea Clonts, the Medical Director, and he said most likely the surgery will be approved.  He'll give Dr. March Rummage a call."  Thank you so much, I appreciate your help.

## 2018-02-11 ENCOUNTER — Observation Stay (HOSPITAL_COMMUNITY): Payer: PPO

## 2018-02-11 ENCOUNTER — Observation Stay (HOSPITAL_COMMUNITY)
Admission: RE | Admit: 2018-02-11 | Discharge: 2018-02-12 | Disposition: A | Discharge status: Skilled Nursing Facility | Payer: PPO | Source: Ambulatory Visit | Attending: Internal Medicine | Admitting: Internal Medicine

## 2018-02-11 ENCOUNTER — Other Ambulatory Visit: Payer: Self-pay

## 2018-02-11 ENCOUNTER — Ambulatory Visit (HOSPITAL_COMMUNITY): Payer: PPO | Admitting: Vascular Surgery

## 2018-02-11 ENCOUNTER — Encounter (HOSPITAL_COMMUNITY): Payer: Self-pay

## 2018-02-11 ENCOUNTER — Encounter (HOSPITAL_COMMUNITY)
Admission: RE | Disposition: A | Discharge status: Skilled Nursing Facility | Payer: Self-pay | Source: Ambulatory Visit | Attending: Internal Medicine

## 2018-02-11 ENCOUNTER — Encounter: Payer: Self-pay | Admitting: Podiatry

## 2018-02-11 DIAGNOSIS — M7741 Metatarsalgia, right foot: Secondary | ICD-10-CM | POA: Diagnosis not present

## 2018-02-11 DIAGNOSIS — M21961 Unspecified acquired deformity of right lower leg: Secondary | ICD-10-CM | POA: Diagnosis not present

## 2018-02-11 DIAGNOSIS — Z96698 Presence of other orthopedic joint implants: Secondary | ICD-10-CM | POA: Diagnosis not present

## 2018-02-11 DIAGNOSIS — Z888 Allergy status to other drugs, medicaments and biological substances status: Secondary | ICD-10-CM | POA: Diagnosis not present

## 2018-02-11 DIAGNOSIS — I4891 Unspecified atrial fibrillation: Secondary | ICD-10-CM | POA: Insufficient documentation

## 2018-02-11 DIAGNOSIS — M778 Other enthesopathies, not elsewhere classified: Secondary | ICD-10-CM | POA: Insufficient documentation

## 2018-02-11 DIAGNOSIS — Z9889 Other specified postprocedural states: Secondary | ICD-10-CM | POA: Diagnosis not present

## 2018-02-11 DIAGNOSIS — M2041 Other hammer toe(s) (acquired), right foot: Secondary | ICD-10-CM | POA: Diagnosis not present

## 2018-02-11 DIAGNOSIS — M779 Enthesopathy, unspecified: Secondary | ICD-10-CM

## 2018-02-11 DIAGNOSIS — I10 Essential (primary) hypertension: Secondary | ICD-10-CM | POA: Diagnosis present

## 2018-02-11 DIAGNOSIS — Z7982 Long term (current) use of aspirin: Secondary | ICD-10-CM | POA: Diagnosis not present

## 2018-02-11 DIAGNOSIS — E785 Hyperlipidemia, unspecified: Secondary | ICD-10-CM | POA: Diagnosis present

## 2018-02-11 DIAGNOSIS — M2011 Hallux valgus (acquired), right foot: Secondary | ICD-10-CM | POA: Diagnosis not present

## 2018-02-11 DIAGNOSIS — Z79899 Other long term (current) drug therapy: Secondary | ICD-10-CM | POA: Diagnosis not present

## 2018-02-11 DIAGNOSIS — G8918 Other acute postprocedural pain: Secondary | ICD-10-CM | POA: Diagnosis not present

## 2018-02-11 DIAGNOSIS — M76891 Other specified enthesopathies of right lower limb, excluding foot: Secondary | ICD-10-CM | POA: Insufficient documentation

## 2018-02-11 HISTORY — PX: METATARSAL HEAD EXCISION: SHX5027

## 2018-02-11 HISTORY — DX: Pneumonia, unspecified organism: J18.9

## 2018-02-11 HISTORY — PX: BUNIONECTOMY WITH HAMMERTOE RECONSTRUCTION: SHX5600

## 2018-02-11 HISTORY — PX: HALLUX FUSION: SHX6621

## 2018-02-11 HISTORY — DX: Anxiety disorder, unspecified: F41.9

## 2018-02-11 HISTORY — PX: HAMMER TOE SURGERY: SHX385

## 2018-02-11 HISTORY — DX: Unspecified osteoarthritis, unspecified site: M19.90

## 2018-02-11 LAB — CBC
HEMATOCRIT: 43.9 % (ref 36.0–46.0)
Hemoglobin: 13.9 g/dL (ref 12.0–15.0)
MCH: 30.1 pg (ref 26.0–34.0)
MCHC: 31.7 g/dL (ref 30.0–36.0)
MCV: 95 fL (ref 78.0–100.0)
PLATELETS: 304 10*3/uL (ref 150–400)
RBC: 4.62 MIL/uL (ref 3.87–5.11)
RDW: 12.5 % (ref 11.5–15.5)
WBC: 7.9 10*3/uL (ref 4.0–10.5)

## 2018-02-11 LAB — BASIC METABOLIC PANEL
ANION GAP: 11 (ref 5–15)
BUN: 15 mg/dL (ref 8–23)
CALCIUM: 9.6 mg/dL (ref 8.9–10.3)
CO2: 25 mmol/L (ref 22–32)
Chloride: 102 mmol/L (ref 98–111)
Creatinine, Ser: 0.73 mg/dL (ref 0.44–1.00)
GFR calc Af Amer: >60 mL/min (ref >60)
Glucose, Bld: 95 mg/dL (ref 70–99)
Potassium: 3.9 mmol/L (ref 3.5–5.1)
Sodium: 138 mmol/L (ref 135–145)

## 2018-02-11 SURGERY — EXCISION, METATARSAL BONE, HEAD
Anesthesia: Monitor Anesthesia Care | Site: Toe | Laterality: Right

## 2018-02-11 MED ORDER — OXYCODONE HCL 5 MG/5ML PO SOLN: 5.0000 mg | Freq: Once | ORAL | Status: DC | PRN

## 2018-02-11 MED ORDER — BUPIVACAINE-EPINEPHRINE (PF) 0.5% -1:200000 IJ SOLN
INTRAMUSCULAR | Status: DC | PRN
  Administered 2018-02-11: 28 mL via PERINEURAL

## 2018-02-11 MED ORDER — MORPHINE SULFATE (PF) 2 MG/ML IV SOLN
2.0000 mg | INTRAVENOUS | Status: DC | PRN
  Administered 2018-02-11: 2 mg via INTRAVENOUS
  Filled 2018-02-11: qty 1

## 2018-02-11 MED ORDER — SODIUM CHLORIDE 0.9% FLUSH: 3.0000 mL | INTRAVENOUS | Status: DC | PRN

## 2018-02-11 MED ORDER — SODIUM CHLORIDE 0.9 % IV SOLN
INTRAVENOUS | Status: DC | PRN
  Administered 2018-02-11: 30 ug/min via INTRAVENOUS

## 2018-02-11 MED ORDER — FENTANYL CITRATE (PF) 250 MCG/5ML IJ SOLN
INTRAMUSCULAR | Status: AC
  Filled 2018-02-11: qty 5

## 2018-02-11 MED ORDER — PROPOFOL 1000 MG/100ML IV EMUL
INTRAVENOUS | Status: AC
  Filled 2018-02-11: qty 100

## 2018-02-11 MED ORDER — FLUTICASONE PROPIONATE 50 MCG/ACT NA SUSP
1.0000 | Freq: Every day | NASAL | Status: DC
Start: 2018-02-12 — End: 2018-02-12
  Administered 2018-02-12: 1 via NASAL
  Filled 2018-02-11: qty 16

## 2018-02-11 MED ORDER — LIDOCAINE 2% (20 MG/ML) 5 ML SYRINGE
INTRAMUSCULAR | Status: AC
  Filled 2018-02-11: qty 5

## 2018-02-11 MED ORDER — DEXAMETHASONE SODIUM PHOSPHATE 10 MG/ML IJ SOLN
INTRAMUSCULAR | Status: AC
  Filled 2018-02-11: qty 1

## 2018-02-11 MED ORDER — LIDOCAINE 2% (20 MG/ML) 5 ML SYRINGE
INTRAMUSCULAR | Status: DC | PRN
  Administered 2018-02-11: 20 mg via INTRAVENOUS

## 2018-02-11 MED ORDER — HYDROCODONE-ACETAMINOPHEN 5-325 MG PO TABS: 1.0000 | ORAL_TABLET | ORAL | Status: DC | PRN

## 2018-02-11 MED ORDER — ASPIRIN EC 81 MG PO TBEC
81.0000 mg | DELAYED_RELEASE_TABLET | Freq: Every day | ORAL | Status: DC
  Administered 2018-02-12: 81 mg via ORAL
  Filled 2018-02-11: qty 1

## 2018-02-11 MED ORDER — 0.9 % SODIUM CHLORIDE (POUR BTL) OPTIME
TOPICAL | Status: DC | PRN
  Administered 2018-02-11: 1000 mL

## 2018-02-11 MED ORDER — VANCOMYCIN HCL 500 MG IV SOLR
INTRAVENOUS | Status: AC
  Filled 2018-02-11: qty 500

## 2018-02-11 MED ORDER — CEFAZOLIN SODIUM-DEXTROSE 2-4 GM/100ML-% IV SOLN
2.0000 g | INTRAVENOUS | Status: AC
  Administered 2018-02-11: 2 g via INTRAVENOUS

## 2018-02-11 MED ORDER — MIDAZOLAM HCL 5 MG/5ML IJ SOLN
INTRAMUSCULAR | Status: DC | PRN
  Administered 2018-02-11 (×2): .5 mg via INTRAVENOUS

## 2018-02-11 MED ORDER — OXYCODONE HCL 5 MG PO TABS
5.0000 mg | ORAL_TABLET | Freq: Once | ORAL | Status: DC | PRN
Start: 2018-02-11 — End: 2018-02-11

## 2018-02-11 MED ORDER — PHENYLEPHRINE HCL 10 MG/ML IJ SOLN
INTRAMUSCULAR | Status: DC | PRN
  Administered 2018-02-11 (×2): 80 ug via INTRAVENOUS
  Administered 2018-02-11 (×2): 40 ug via INTRAVENOUS

## 2018-02-11 MED ORDER — BUPIVACAINE HCL (PF) 0.5 % IJ SOLN
INTRAMUSCULAR | Status: AC
Start: 2018-02-11 — End: ?
  Filled 2018-02-11: qty 30

## 2018-02-11 MED ORDER — ACETAMINOPHEN 325 MG PO TABS: 650.0000 mg | ORAL_TABLET | ORAL | Status: DC | PRN

## 2018-02-11 MED ORDER — ONDANSETRON HCL 4 MG/2ML IJ SOLN
INTRAMUSCULAR | Status: AC
  Filled 2018-02-11: qty 2

## 2018-02-11 MED ORDER — PRAVASTATIN SODIUM 40 MG PO TABS
40.0000 mg | ORAL_TABLET | Freq: Every day | ORAL | Status: DC
  Administered 2018-02-11: 40 mg via ORAL
  Filled 2018-02-11: qty 1

## 2018-02-11 MED ORDER — AMLODIPINE BESYLATE 5 MG PO TABS
5.0000 mg | ORAL_TABLET | Freq: Every day | ORAL | Status: DC
  Administered 2018-02-12: 5 mg via ORAL
  Filled 2018-02-11: qty 1

## 2018-02-11 MED ORDER — ONDANSETRON HCL 4 MG/2ML IJ SOLN
INTRAMUSCULAR | Status: DC | PRN
  Administered 2018-02-11: 4 mg via INTRAVENOUS

## 2018-02-11 MED ORDER — ALPRAZOLAM 0.25 MG PO TABS
0.2500 mg | ORAL_TABLET | Freq: Two times a day (BID) | ORAL | Status: DC | PRN
  Administered 2018-02-11: 0.5 mg via ORAL
  Filled 2018-02-11: qty 2

## 2018-02-11 MED ORDER — PROPOFOL 10 MG/ML IV BOLUS
INTRAVENOUS | Status: DC | PRN
  Administered 2018-02-11: 90 mg via INTRAVENOUS

## 2018-02-11 MED ORDER — TRAZODONE HCL 50 MG PO TABS: 25.0000 mg | ORAL_TABLET | Freq: Every evening | ORAL | Status: DC | PRN

## 2018-02-11 MED ORDER — IRBESARTAN 150 MG PO TABS
75.0000 mg | ORAL_TABLET | Freq: Every day | ORAL | Status: DC
  Administered 2018-02-12: 75 mg via ORAL
  Filled 2018-02-11: qty 1

## 2018-02-11 MED ORDER — DEXAMETHASONE SODIUM PHOSPHATE 4 MG/ML IJ SOLN
INTRAMUSCULAR | Status: DC | PRN
  Administered 2018-02-11: 5 mg via INTRAVENOUS

## 2018-02-11 MED ORDER — OXYCODONE-ACETAMINOPHEN 5-325 MG PO TABS
1.0000 | ORAL_TABLET | ORAL | Status: DC | PRN
  Administered 2018-02-12 (×4): 2 via ORAL
  Filled 2018-02-11 (×4): qty 2

## 2018-02-11 MED ORDER — MIRABEGRON ER 25 MG PO TB24
25.0000 mg | ORAL_TABLET | Freq: Every day | ORAL | Status: DC
Start: 2018-02-12 — End: 2018-02-12
  Administered 2018-02-12: 25 mg via ORAL
  Filled 2018-02-11: qty 1

## 2018-02-11 MED ORDER — CEFAZOLIN SODIUM-DEXTROSE 2-4 GM/100ML-% IV SOLN
INTRAVENOUS | Status: AC
  Filled 2018-02-11: qty 100

## 2018-02-11 MED ORDER — PROPOFOL 10 MG/ML IV BOLUS
INTRAVENOUS | Status: AC
  Filled 2018-02-11: qty 20

## 2018-02-11 MED ORDER — MORPHINE SULFATE (PF) 4 MG/ML IV SOLN: 4.0000 mg | INTRAVENOUS | Status: DC | PRN

## 2018-02-11 MED ORDER — FENTANYL CITRATE (PF) 100 MCG/2ML IJ SOLN: 25.0000 ug | INTRAMUSCULAR | Status: DC | PRN

## 2018-02-11 MED ORDER — FENTANYL CITRATE (PF) 100 MCG/2ML IJ SOLN
INTRAMUSCULAR | Status: DC | PRN
  Administered 2018-02-11 (×3): 25 ug via INTRAVENOUS
  Administered 2018-02-11: 50 ug via INTRAVENOUS
  Administered 2018-02-11 (×2): 25 ug via INTRAVENOUS

## 2018-02-11 MED ORDER — LACTATED RINGERS IV SOLN
INTRAVENOUS | Status: DC | PRN
  Administered 2018-02-11 (×2): via INTRAVENOUS

## 2018-02-11 MED ORDER — SODIUM CHLORIDE 0.9 % IV SOLN
250.0000 mL | INTRAVENOUS | Status: DC | PRN
  Administered 2018-02-11: 250 mL via INTRAVENOUS

## 2018-02-11 MED ORDER — PROMETHAZINE HCL 25 MG PO TABS: 12.5000 mg | ORAL_TABLET | ORAL | Status: DC | PRN

## 2018-02-11 MED ORDER — SODIUM CHLORIDE 0.9% FLUSH
3.0000 mL | Freq: Two times a day (BID) | INTRAVENOUS | Status: DC
  Administered 2018-02-11 (×2): 3 mL via INTRAVENOUS

## 2018-02-11 MED ORDER — MIDAZOLAM HCL 2 MG/2ML IJ SOLN
INTRAMUSCULAR | Status: AC
  Filled 2018-02-11: qty 2

## 2018-02-11 MED ORDER — ENOXAPARIN SODIUM 40 MG/0.4ML ~~LOC~~ SOLN
40.0000 mg | SUBCUTANEOUS | Status: DC
  Administered 2018-02-12: 40 mg via SUBCUTANEOUS
  Filled 2018-02-11: qty 0.4

## 2018-02-11 SURGICAL SUPPLY — 71 items
BANDAGE ACE 4X5 VEL STRL LF (GAUZE/BANDAGES/DRESSINGS) ×3 IMPLANT
BANDAGE ESMARK 6X9 LF (GAUZE/BANDAGES/DRESSINGS) ×1 IMPLANT
BIT DRILL 2.0 (BIT) ×3
BIT DRILL 2.0X110 (BIT) ×1 IMPLANT
BIT DRILL 2.5X3.5XSCR (BIT) ×1 IMPLANT
BIT DRILL STD 2.0 F/EASYCLIP (BIT) ×1
BIT DRL 2.5X3.5XSCR (BIT) ×1
BLADE AVERAGE 25MMX9MM (BLADE) ×1
BLADE AVERAGE 25X9 (BLADE) ×2 IMPLANT
BLADE OSCILLATING/SAGITTAL (BLADE) ×2
BLADE SURG 15 STRL LF DISP TIS (BLADE) ×3 IMPLANT
BLADE SURG 15 STRL SS (BLADE) ×6
BLADE SW THK.38XMED NAR THN (BLADE) ×1 IMPLANT
BNDG ESMARK 6X9 LF (GAUZE/BANDAGES/DRESSINGS) ×3
BNDG GAUZE ELAST 4 BULKY (GAUZE/BANDAGES/DRESSINGS) ×6 IMPLANT
CHLORAPREP W/TINT 26ML (MISCELLANEOUS) ×3 IMPLANT
CLIP EASY STAPLE 15-12-12 COMP (Clip) ×2 IMPLANT
COVER MAYO STAND STRL (DRAPES) ×3 IMPLANT
COVER SURGICAL LIGHT HANDLE (MISCELLANEOUS) ×3 IMPLANT
CUFF TOURNIQUET SINGLE 34IN LL (TOURNIQUET CUFF) ×3 IMPLANT
DRAPE C-ARM MINI 42X72 WSTRAPS (DRAPES) ×3 IMPLANT
DRILL BIT 2.5MM (BIT) ×2
DRSG EMULSION OIL 3X3 NADH (GAUZE/BANDAGES/DRESSINGS) IMPLANT
ELECT CAUTERY BLADE 6.4 (BLADE) ×3 IMPLANT
ELECT REM PT RETURN 9FT ADLT (ELECTROSURGICAL) ×3
ELECTRODE REM PT RTRN 9FT ADLT (ELECTROSURGICAL) ×1 IMPLANT
GAUZE SPONGE 4X4 12PLY STRL (GAUZE/BANDAGES/DRESSINGS) ×3 IMPLANT
GAUZE XEROFORM 5X9 LF (GAUZE/BANDAGES/DRESSINGS) ×3 IMPLANT
GLOVE BIO SURGEON STRL SZ 6.5 (GLOVE) ×2 IMPLANT
GLOVE BIO SURGEON STRL SZ7 (GLOVE) ×3 IMPLANT
GLOVE BIO SURGEON STRL SZ7.5 (GLOVE) ×3 IMPLANT
GLOVE BIO SURGEONS STRL SZ 6.5 (GLOVE) ×1
GLOVE BIOGEL PI IND STRL 7.0 (GLOVE) ×2 IMPLANT
GLOVE BIOGEL PI IND STRL 8 (GLOVE) ×2 IMPLANT
GLOVE BIOGEL PI INDICATOR 7.0 (GLOVE) ×4
GLOVE BIOGEL PI INDICATOR 8 (GLOVE) ×4
GLOVE EUDERMIC 6.5 POWDERFREE (GLOVE) ×3 IMPLANT
GOWN STRL REUS W/ TWL LRG LVL3 (GOWN DISPOSABLE) ×1 IMPLANT
GOWN STRL REUS W/ TWL XL LVL3 (GOWN DISPOSABLE) ×1 IMPLANT
GOWN STRL REUS W/TWL LRG LVL3 (GOWN DISPOSABLE) ×2
GOWN STRL REUS W/TWL XL LVL3 (GOWN DISPOSABLE) ×3
GUIDEWIRE ORTH 6X062XTROC NS (WIRE) ×3 IMPLANT
K-WIRE .062 (WIRE) ×6
K-WIRE OLIVE 1.2X65 (WIRE) ×3
KIT BASIN OR (CUSTOM PROCEDURE TRAY) ×3 IMPLANT
KIT TURNOVER KIT B (KITS) ×3 IMPLANT
KWIRE OLIVE 1.2X65 (WIRE) ×1 IMPLANT
NEEDLE HYPO 25GX1X1/2 BEV (NEEDLE) ×3 IMPLANT
NS IRRIG 1000ML POUR BTL (IV SOLUTION) ×3 IMPLANT
PACK ORTHO EXTREMITY (CUSTOM PROCEDURE TRAY) ×3 IMPLANT
PAD ARMBOARD 7.5X6 YLW CONV (MISCELLANEOUS) ×3 IMPLANT
PIN CAPS ORTHO GREEN .062 (PIN) ×3 IMPLANT
PLATE HALLUX MTP 5H RT (Plate) ×3 IMPLANT
PUTTY BONE DBX 2.5 MIS (Bone Implant) ×3 IMPLANT
SCREW BONE LOCK 3X18MM TI T8 (Screw) ×3 IMPLANT
SCREW CORT NL STRDRV 3X24 (Screw) ×3 IMPLANT
SCREW LOCK 3.0X14MM (Screw) ×6 IMPLANT
SCREW NON LOCK 3X22MM (Screw) ×3 IMPLANT
SCREW NONLOCK 3.0X16 (Screw) ×3 IMPLANT
SCREW NONLOCK 3.0X20MM (Screw) ×3 IMPLANT
SOL PREP POV-IOD 4OZ 10% (MISCELLANEOUS) ×3 IMPLANT
STAPLE CLIP EASY 15-12-12 COMP (Clip) ×1 IMPLANT
SUT ETHILON 3 0 PS 1 (SUTURE) ×3 IMPLANT
SUT ETHILON 4 0 PS 2 18 (SUTURE) ×15 IMPLANT
SUT MNCRL AB 3-0 PS2 18 (SUTURE) ×9 IMPLANT
SUT VIC AB 2-0 FS1 27 (SUTURE) ×3 IMPLANT
SYR CONTROL 10ML LL (SYRINGE) ×3 IMPLANT
TOWEL OR 17X26 10 PK STRL BLUE (TOWEL DISPOSABLE) ×3 IMPLANT
TUBE CONNECTING 12'X1/4 (SUCTIONS) ×1
TUBE CONNECTING 12X1/4 (SUCTIONS) ×2 IMPLANT
YANKAUER SUCT BULB TIP NO VENT (SUCTIONS) ×3 IMPLANT

## 2018-02-11 NOTE — H&P (Signed)
History and Physical    Sandra Lynch UXN:[id number] DOB: 16-Jul-1935 DOA: 02/11/2018  PCP: Lillard Anes, MD Consultants:  March Rummage - podiatry  Chief Complaint: foot surgery  HPI: Sandra Lynch is a 83 y.o. female with medical history significant of HTN; HLD; and afib on ASA presenting for right foot surgery.  TR[facility] has been consulted for medical management.  She reports feeling well after the surgery and has no complaints.   Operative Course: Patient presented for foot reconstruction - right pan metatarsal head resection for hammer toes with correction and hallux MPJ fusion on the right foot. She needs to be partial weight bearing to her foot and she is a big fall risk. She will need observation overnight with PT consult. She is currently in PACU. Podiatry has placed all of the orders, but he did not restart home meds.  Review of Systems: As per HPI; otherwise review of systems reviewed and negative.    Past Medical History:  Diagnosis Date  . Allergic rhinitis   . Anxiety   . Atrial fibrillation ([place])   . Cough   . Essential hypertension   . GERD (gastroesophageal reflux disease)   . Hyperlipidemia   . Hyponatremia   . Pneumonia    walking pneumonia  . Urge incontinence     Past Surgical History:  Procedure Laterality Date  . APPENDECTOMY    . CARDIOVASCULAR STRESS TEST     01/25/18 [facility]): No reversible defects, fixed defect of mod-size in the septal wall mid ventricle to base, hypokinetic septal wall from mid ventricle to base, LVEF 50%, low risk  . TOTAL ABDOMINAL HYSTERECTOMY    . TRANSTHORACIC ECHOCARDIOGRAM     01/24/18 [facility]): LV systolic function normal, EF 55-60%, impaired relaxation, LA mildly dilated, trace-mild MR, trace TR    Social History   Socioeconomic History  . Marital status: Widowed    Spouse name: Not on file  . Number of children: Not on file  . Years of education: Not on file  . Highest education level: Not on file    Occupational History  . Not on file  Social Needs  . Financial resource strain: Not on file  . Food insecurity:    Worry: Not on file    Inability: Not on file  . Transportation needs:    Medical: Not on file    Non-medical: Not on file  Tobacco Use  . Smoking status: Never Smoker  . Smokeless tobacco: Never Used  Substance and Sexual Activity  . Alcohol use: Not Currently    Comment: occasional   . Drug use: Never  . Sexual activity: Not on file  Lifestyle  . Physical activity:    Days per week: Not on file    Minutes per session: Not on file  . Stress: Not on file  Relationships  . Social connections:    Talks on phone: Not on file    Gets together: Not on file    Attends religious service: Not on file    Active member of club or organization: Not on file    Attends meetings of clubs or organizations: Not on file    Relationship status: Not on file  . Intimate partner violence:    Fear of current or ex partner: Not on file    Emotionally abused: Not on file    Physically abused: Not on file    Forced sexual activity: Not on file  Other Topics Concern  . Not  on file  Social History Narrative  . Not on file    Allergies  Allergen Reactions  . Lisinopril Cough    Family History  Problem Relation Age of Onset  . Pneumonia Mother   . Heart attack Father   . Hypertension Father   . Other Brother        Oncologist    Prior to Admission medications   Medication Sig Start Date End Date Taking? Authorizing Provider  ALPRAZolam (XANAX) 0.25 MG tablet Take 0.25-0.5 mg by mouth 2 (two) times daily as needed for anxiety.   Yes [provider]  amLODipine (NORVASC) 5 MG tablet Take 5 mg by mouth daily. 07/30/17  Yes [provider]  aspirin EC 81 MG tablet Take 1 tablet by mouth daily.   Yes [provider]  Calcium Carbonate-Vitamin D (CALTRATE 600+D PO) Take 1 tablet by mouth daily.   Yes [provider]  fluticasone (FLONASE) 50  MCG/ACT nasal spray Place 1 spray into both nostrils daily. 12/29/17  Yes [provider]  ibuprofen (ADVIL,MOTRIN) 200 MG tablet Take 400 mg by mouth at bedtime as needed (pain).    Yes [provider]  Multiple Vitamins-Minerals (CENTRUM SILVER 50+WOMEN PO) Take 1 tablet by mouth daily.    Yes [provider]  MYRBETRIQ 25 MG TB24 tablet Take 25 mg by mouth daily. 08/06/17  Yes [provider]  Omega-3 Fatty Acids (FISH OIL) 1000 MG CAPS Take 1 tablet by mouth daily.   Yes [provider]  omeprazole (PRILOSEC) 20 MG capsule Take 20 mg by mouth daily as needed (heartburn).    Yes [provider]  pravastatin (PRAVACHOL) 40 MG tablet Take 40 mg by mouth at bedtime. 06/30/17  Yes [provider]  sodium chloride (OCEAN) 0.65 % SOLN nasal spray Place 1 spray into both nostrils as needed for congestion.   Yes [provider]  valsartan (DIOVAN) 40 MG tablet Take 40 mg by mouth daily. for blood pressure 07/31/17  Yes [provider]    Physical Exam: Vitals:   02/11/18 1250 02/11/18 1351 02/11/18 1452 02/11/18 1513  BP: 132/68 124/63 135/65 (!) 142/55  Pulse:   88 85  Resp:   18 16  Temp:    97.9 F (36.6 C)  TempSrc:    Oral  SpO2:   98% 96%  Weight:      Height:         General:  Appears calm and comfortable and is NAD Eyes:   EOMI, normal lids, iris ENT:  grossly normal hearing, lips & tongue, mmm Neck:  no LAD, masses or thyromegaly; no carotid bruits Cardiovascular:  RRR, no m/r/g. No LE edema.  Respiratory:   CTA bilaterally with no wheezes/rales/rhonchi.  Normal respiratory effort. Abdomen:  soft, NT, ND, NABS Skin:  no rash or induration seen on limited exam Musculoskeletal:  grossly normal tone BUE/BLE, good ROM, no bony abnormality.  RLE is bandaged with external fixation hardware present.  Dressings are C/D/I. Psychiatric:  grossly normal mood and affect, speech fluent and appropriate,  AOx3   Radiological Exams on Admission: Dg Foot Complete Right  Result Date: 02/11/2018 CLINICAL DATA:  Postop hammertoe surgery of the right foot EXAM: RIGHT FOOT COMPLETE - 3+ VIEW COMPARISON:  None. FINDINGS: Plate and screw fixation of the right first MTP joint is present for fusion. K-wires have been inserted through the right second, third, and fourth toes into the proximal metatarsals for fixation. There is  absence of the head of the right fifth metatarsal, probably postoperative, but osteomyelitis cannot be excluded. Correlate clinically. No other abnormality is seen. IMPRESSION: 1. Plate and screw fixation for fusion of the right first MTP joint. 2. K-wires placed for stabilization of the right second, third, and fourth toes and proximal metatarsals with amputation of the distal second, third, and fourth metatarsals. 3. Amputation of the distal right fifth metatarsal. Cannot exclude osteomyelitis. Correlate clinically. Electronically Signed   By: Ivar Drape M.D.   On: 02/11/2018 13:03    EKG: None   Labs on Admission: I have personally reviewed the available labs and imaging studies at the time of the admission.  Pertinent labs:   BMP WNL CBC WNL   Assessment/Plan Principal Problem:   S/P foot surgery, right Active Problems:   Hammertoe of right foot   Essential hypertension   Hyperlipidemia   Hammertoe, s/p R foot surgery -She did well surgically  -She is expected to require SNF rehab since she will be only partially weight-bearing -Will observe overnight for PT consult and placement  HTN -Resume Norvasc, Diovan in AM  HLD -Continue Pravachol    DVT prophylaxis: Lovenox  Code Status:  Full - confirmed with patient Family Communication: None present Disposition Plan:   To SNF (likely [facility]) once clinically improved Consults called:  Podiatry; PT/SW Admission status: It is my clinical opinion that referral for OBSERVATION is reasonable and necessary in this  patient based on the above information provided. The aforementioned taken together are felt to place the patient at high risk for further clinical deterioration. However it is anticipated that the patient may be medically stable for discharge from the hospital within 24 to 48 hours.     Karmen Bongo MD [facility]  If note is complete, please contact covering daytime or nighttime physician. [email or web address] Password [facility]  02/11/2018, 3:33 PM

## 2018-02-11 NOTE — Interval H&P Note (Signed)
History and Physical Interval Note:  02/11/2018 7:05 AM  Sandra Lynch  has presented today for surgery, with the diagnosis of hallux abducto valgus hammertoe  capsulitis joint  The various methods of treatment have been discussed with the patient and family. After consideration of risks, benefits and other options for treatment, the patient has consented to  Procedure(s): PAN METATARSAL HEAD RESECTION (Right) HAMMER TOE CORRECTION SECOND, THIRD, FOURTH AND FIFTH (Right) HALLUX MPJ FUSION RIGHT FOOT (Right) as a surgical intervention .  The patient's history has been reviewed, patient examined, no change in status, stable for surgery.  I have reviewed the patient's chart and labs.  Questions were answered to the patient's satisfaction.     [name]

## 2018-02-11 NOTE — Anesthesia Procedure Notes (Signed)
Procedure Name: LMA Insertion Date/Time: 02/11/2018 7:39 AM Performed by: White, Amedeo Plenty, CRNA Pre-anesthesia Checklist: Patient identified, Emergency Drugs available, Suction available and Patient being monitored Patient Re-evaluated:Patient Re-evaluated prior to induction Oxygen Delivery Method: Circle System Utilized Preoxygenation: Pre-oxygenation with 100% oxygen Induction Type: IV induction Ventilation: Mask ventilation without difficulty LMA: LMA inserted LMA Size: 4.0 Number of attempts: 1 Placement Confirmation: positive ETCO2 Tube secured with: Tape Dental Injury: Teeth and Oropharynx as per pre-operative assessment

## 2018-02-11 NOTE — Anesthesia Preprocedure Evaluation (Signed)
Anesthesia Evaluation  Patient identified by MRN, date of birth, ID band Patient awake    Reviewed: Allergy & Precautions, NPO status , Patient's Chart, lab work & pertinent test results  History of Anesthesia Complications Negative for: history of anesthetic complications  Airway Mallampati: II  TM Distance: >3 FB Neck ROM: Full    Dental  (+) Partial Upper, Dental Advisory Given   Pulmonary neg pulmonary ROS,    breath sounds clear to auscultation       Cardiovascular hypertension, Pt. on medications (-) angina(-) Past MI and (-) CHF (-) dysrhythmias  Rhythm:Regular     Neuro/Psych PSYCHIATRIC DISORDERS Anxiety negative neurological ROS     GI/Hepatic Neg liver ROS, GERD  Medicated and Controlled,  Endo/Other  negative endocrine ROS  Renal/GU negative Renal ROS     Musculoskeletal   Abdominal   Peds  Hematology negative hematology ROS (+)   Anesthesia Other Findings   Reproductive/Obstetrics                             Anesthesia Physical Anesthesia Plan  ASA: II  Anesthesia Plan: MAC and Regional   Post-op Pain Management:    Induction:   PONV Risk Score and Plan: 2 and Treatment may vary due to age or medical condition and Ondansetron  Airway Management Planned: Nasal Cannula  Additional Equipment: None  Intra-op Plan:   Post-operative Plan:   Informed Consent: I have reviewed the patients History and Physical, chart, labs and discussed the procedure including the risks, benefits and alternatives for the proposed anesthesia with the patient or authorized representative who has indicated his/her understanding and acceptance.   Dental advisory given  Plan Discussed with: CRNA and Surgeon  Anesthesia Plan Comments:         Anesthesia Quick Evaluation

## 2018-02-11 NOTE — Brief Op Note (Signed)
02/11/2018  10:45 AM  PATIENT:  Sandra Lynch  83 y.o. female  PRE-OPERATIVE DIAGNOSIS:  Right hallux abducto valgus hammertoe  capsulitis joint  POST-OPERATIVE DIAGNOSIS:  Right hallux abducto valgus hammertoe  capsulitis joint  PROCEDURE:  Procedure(s): RIGHT PAN METATARSAL HEAD RESECTION (Right) RIGHT HAMMER TOE CORRECTION SECOND, THIRD, FOURTH AND FIFTH (Right) HALLUX MPJ FUSION RIGHT FOOT (Right)  SURGEON:  Surgeon(s) and Role:    * [name], DPM - Primary  PHYSICIAN ASSISTANT:   ASSISTANTS: none   ANESTHESIA:   general  EBL:  20 mL   BLOOD ADMINISTERED:none  DRAINS: none   LOCAL MEDICATIONS USED:  NONE  SPECIMEN:  No Specimen  DISPOSITION OF SPECIMEN:  N/A  COUNTS:  YES  TOURNIQUET:   Total Tourniquet Time Documented: Thigh (Right) - 121 minutes Total: Thigh (Right) - 121 minutes   DICTATION: .Note written in EPIC  PLAN OF CARE: Admit for overnight observation  PATIENT DISPOSITION:  PACU - hemodynamically stable.   Delay start of Pharmacological VTE agent (>24hrs) due to surgical blood loss or risk of bleeding: not applicable

## 2018-02-11 NOTE — Progress Notes (Signed)
Orthopedic Tech Progress Note Patient Details:  Sandra Lynch [birth year] [id number]  Ortho Devices Type of Ortho Device: CAM walker       [name] 02/11/2018, 11:02 AM

## 2018-02-11 NOTE — Plan of Care (Signed)
  Problem: Education: Goal: Knowledge of General Education information will improve Description Including pain rating scale, medication(s)/side effects and non-pharmacologic comfort measures Outcome: Progressing   Problem: Clinical Measurements: Goal: Ability to maintain clinical measurements within normal limits will improve Outcome: Progressing   Problem: Clinical Measurements: Goal: Will remain free from infection Outcome: Progressing   Problem: Activity: Goal: Risk for activity intolerance will decrease Outcome: Progressing   Problem: Elimination: Goal: Will not experience complications related to bowel motility Outcome: Progressing   Problem: Pain Managment: Goal: General experience of comfort will improve Outcome: Progressing   Problem: Safety: Goal: Ability to remain free from injury will improve Outcome: Progressing

## 2018-02-11 NOTE — Anesthesia Postprocedure Evaluation (Signed)
Anesthesia Post Note  Patient: Sandra Lynch  Procedure(s) Performed: RIGHT PAN METATARSAL HEAD RESECTION (Right Toe) RIGHT HAMMER TOE CORRECTION SECOND, THIRD, FOURTH AND FIFTH (Right Toe) HALLUX MPJ FUSION RIGHT FOOT (Right Toe)     Patient location during evaluation: PACU Anesthesia Type: Regional and MAC Level of consciousness: awake and alert Pain management: pain level controlled Vital Signs Assessment: post-procedure vital signs reviewed and stable Respiratory status: spontaneous breathing and respiratory function stable Cardiovascular status: stable Postop Assessment: no apparent nausea or vomiting Anesthetic complications: no    Last Vitals:  Vitals:   02/11/18 1105 02/11/18 1120  BP: (!) 124/58 (!) 121/53  Pulse: 81 75  Resp: 17 20  Temp:    SpO2: 96% 96%    Last Pain:  Vitals:   02/11/18 1120  TempSrc:   PainSc: 0-No pain                 Ambri Miltner DANIEL

## 2018-02-11 NOTE — Plan of Care (Signed)
  Problem: Pain Managment: Goal: General experience of comfort will improve Outcome: Progressing   Problem: Safety: Goal: Ability to remain free from injury will improve Outcome: Progressing   Problem: Clinical Measurements: Goal: Will remain free from infection Outcome: Progressing   Problem: Skin Integrity: Goal: Risk for impaired skin integrity will decrease Outcome: Progressing   Problem: Coping: Goal: Level of anxiety will decrease Outcome: Progressing   Problem: Activity: Goal: Risk for activity intolerance will decrease Outcome: Progressing

## 2018-02-11 NOTE — Transfer of Care (Signed)
Immediate Anesthesia Transfer of Care Note  Patient: Sandra Lynch  Procedure(s) Performed: RIGHT PAN METATARSAL HEAD RESECTION (Right Toe) RIGHT HAMMER TOE CORRECTION SECOND, THIRD, FOURTH AND FIFTH (Right Toe) HALLUX MPJ FUSION RIGHT FOOT (Right Toe)  Patient Location: PACU  Anesthesia Type:GA combined with regional for post-op pain  Level of Consciousness: awake, alert  and patient cooperative  Airway & Oxygen Therapy: Patient Spontanous Breathing  Post-op Assessment: Report given to RN and Post -op Vital signs reviewed and stable  Post vital signs: Reviewed and stable  Last Vitals:  Vitals Value Taken Time  BP 128/65 02/11/2018 10:50 AM  Temp    Pulse 80 02/11/2018 10:53 AM  Resp 13 02/11/2018 10:53 AM  SpO2 93 % 02/11/2018 10:53 AM  Vitals shown include unvalidated device data.  Last Pain:  Vitals:   02/11/18 7681  TempSrc: Oral  PainSc: 0-No pain      Patients Stated Pain Goal: 0 (15/72/62 0355)  Complications: No apparent anesthesia complications

## 2018-02-11 NOTE — Anesthesia Procedure Notes (Signed)
Anesthesia Regional Block: Popliteal block   Pre-Anesthetic Checklist: ,, timeout performed, Correct Patient, Correct Site, Correct Laterality, Correct Procedure, Correct Position, site marked, Risks and benefits discussed,  Surgical consent,  Pre-op evaluation,  At surgeon's request and post-op pain management  Laterality: Lower and Right  Prep: chloraprep       Needles:  Injection technique: Single-shot  Needle Type: Echogenic Needle          Additional Needles:   Procedures:,,,, ultrasound used (permanent image in chart),,,,  Narrative:  Start time: 02/11/2018 7:14 AM End time: 02/11/2018 7:18 AM Injection made incrementally with aspirations every 5 mL.  Performed by: Personally   Additional Notes: H+P and labs reviewed, risks and benefits discussed with patient, procedure tolerated well without complications

## 2018-02-12 ENCOUNTER — Other Ambulatory Visit: Payer: Self-pay

## 2018-02-12 ENCOUNTER — Telehealth: Payer: Self-pay | Admitting: *Deleted

## 2018-02-12 ENCOUNTER — Encounter (HOSPITAL_COMMUNITY): Payer: Self-pay | Admitting: Podiatry

## 2018-02-12 ENCOUNTER — Other Ambulatory Visit: Payer: Self-pay | Admitting: Podiatry

## 2018-02-12 ENCOUNTER — Ambulatory Visit: Payer: PPO | Admitting: Cardiology

## 2018-02-12 DIAGNOSIS — I10 Essential (primary) hypertension: Secondary | ICD-10-CM

## 2018-02-12 DIAGNOSIS — E569 Vitamin deficiency, unspecified: Secondary | ICD-10-CM | POA: Diagnosis not present

## 2018-02-12 DIAGNOSIS — R262 Difficulty in walking, not elsewhere classified: Secondary | ICD-10-CM | POA: Diagnosis not present

## 2018-02-12 DIAGNOSIS — G8918 Other acute postprocedural pain: Secondary | ICD-10-CM | POA: Diagnosis not present

## 2018-02-12 DIAGNOSIS — I119 Hypertensive heart disease without heart failure: Secondary | ICD-10-CM | POA: Diagnosis not present

## 2018-02-12 DIAGNOSIS — Z9889 Other specified postprocedural states: Secondary | ICD-10-CM | POA: Diagnosis not present

## 2018-02-12 DIAGNOSIS — Z4889 Encounter for other specified surgical aftercare: Secondary | ICD-10-CM | POA: Diagnosis not present

## 2018-02-12 DIAGNOSIS — M2011 Hallux valgus (acquired), right foot: Secondary | ICD-10-CM | POA: Diagnosis not present

## 2018-02-12 DIAGNOSIS — M7741 Metatarsalgia, right foot: Secondary | ICD-10-CM | POA: Diagnosis not present

## 2018-02-12 DIAGNOSIS — Z9119 Patient's noncompliance with other medical treatment and regimen: Secondary | ICD-10-CM | POA: Diagnosis not present

## 2018-02-12 DIAGNOSIS — Z7401 Bed confinement status: Secondary | ICD-10-CM | POA: Diagnosis not present

## 2018-02-12 DIAGNOSIS — M2041 Other hammer toe(s) (acquired), right foot: Secondary | ICD-10-CM | POA: Diagnosis not present

## 2018-02-12 DIAGNOSIS — R0981 Nasal congestion: Secondary | ICD-10-CM | POA: Diagnosis not present

## 2018-02-12 DIAGNOSIS — N3281 Overactive bladder: Secondary | ICD-10-CM | POA: Diagnosis not present

## 2018-02-12 DIAGNOSIS — K219 Gastro-esophageal reflux disease without esophagitis: Secondary | ICD-10-CM | POA: Diagnosis not present

## 2018-02-12 DIAGNOSIS — K5900 Constipation, unspecified: Secondary | ICD-10-CM | POA: Diagnosis not present

## 2018-02-12 DIAGNOSIS — M21619 Bunion of unspecified foot: Secondary | ICD-10-CM | POA: Diagnosis not present

## 2018-02-12 DIAGNOSIS — S99821S Other specified injuries of right foot, sequela: Secondary | ICD-10-CM | POA: Diagnosis not present

## 2018-02-12 DIAGNOSIS — E785 Hyperlipidemia, unspecified: Secondary | ICD-10-CM | POA: Diagnosis not present

## 2018-02-12 DIAGNOSIS — M2010 Hallux valgus (acquired), unspecified foot: Secondary | ICD-10-CM | POA: Diagnosis not present

## 2018-02-12 DIAGNOSIS — M2550 Pain in unspecified joint: Secondary | ICD-10-CM | POA: Diagnosis not present

## 2018-02-12 DIAGNOSIS — M2040 Other hammer toe(s) (acquired), unspecified foot: Secondary | ICD-10-CM | POA: Diagnosis not present

## 2018-02-12 DIAGNOSIS — S99821D Other specified injuries of right foot, subsequent encounter: Secondary | ICD-10-CM | POA: Diagnosis not present

## 2018-02-12 DIAGNOSIS — I4891 Unspecified atrial fibrillation: Secondary | ICD-10-CM | POA: Diagnosis not present

## 2018-02-12 DIAGNOSIS — Z471 Aftercare following joint replacement surgery: Secondary | ICD-10-CM | POA: Diagnosis not present

## 2018-02-12 MED ORDER — ALPRAZOLAM 0.25 MG PO TABS: 0.2500 mg | ORAL_TABLET | Freq: Two times a day (BID) | ORAL | 0 refills | Status: DC | PRN

## 2018-02-12 MED ORDER — ENOXAPARIN SODIUM 30 MG/0.3ML ~~LOC~~ SOLN: 30.0000 mg | SUBCUTANEOUS | 0 refills | Status: DC

## 2018-02-12 MED ORDER — OXYCODONE-ACETAMINOPHEN 5-325 MG PO TABS: 1.0000 | ORAL_TABLET | Freq: Four times a day (QID) | ORAL | 0 refills | Status: AC | PRN

## 2018-02-12 MED ORDER — ACETAMINOPHEN 325 MG PO TABS: 650.0000 mg | ORAL_TABLET | ORAL | Status: DC | PRN

## 2018-02-12 NOTE — Evaluation (Signed)
Physical Therapy Evaluation Patient Details Name: Sandra Lynch MRN: [id number] DOB: 13-Apr-1935 Today's Date: 02/12/2018   History of Present Illness  Pt is an 83 y/o female s/p RIGHT PAN METATARSAL HEAD RESECTION (Right Toe). PMH including but not limited to a-fib and HTN.    Clinical Impression  Pt presented supine in bed with HOB elevated, awake and willing to participate in therapy session. Prior to admission, pt reported that she ambulated with use of RW at all time and lives alone. Pt currently requires min guard for bed mobility and max-total A x2 for transfers. Attempted transfers with RW; however, unsuccessful and had to use [facility] with gait belt (face-to-face) method. Pt would continue to benefit from skilled physical therapy services at this time while admitted and after d/c to address the below listed limitations in order to improve overall safety and independence with functional mobility.     Follow Up Recommendations SNF    Equipment Recommendations  None recommended by PT    Recommendations for Other Services       Precautions / Restrictions Precautions Precautions: Fall Restrictions Weight Bearing Restrictions: Yes RLE Weight Bearing: Non weight bearing      Mobility  Bed Mobility Overal bed mobility: Needs Assistance Bed Mobility: Supine to Sit     Supine to sit: Min guard     General bed mobility comments: increased time and effort, min guard for safety  Transfers Overall transfer level: Needs assistance Equipment used: Rolling walker (2 wheeled);2 person hand held assist Transfers: Sit to/from [facility] Sit to Stand: Max assist;+2 physical assistance Stand pivot transfers: Total assist;+2 physical assistance       General transfer comment: increased time and effort, cueing for technique, cueing for safe hand placement; initially attempted with RW but pt unable to safely pivot to chair; then attempted with [facility] and gait belt  (face-to-face); pt with difficulty maintaining NWB R LE throughout  Ambulation/Gait                Stairs            Wheelchair Mobility    Modified Rankin (Stroke Patients Only)       Balance Overall balance assessment: Needs assistance Sitting-balance support: No upper extremity supported Sitting balance-Leahy Scale: Fair     Standing balance support: During functional activity;Bilateral upper extremity supported Standing balance-Leahy Scale: Poor                               Pertinent Vitals/Pain Pain Assessment: Faces Faces Pain Scale: Hurts little more Pain Location: R foot Pain Descriptors / Indicators: Sore Pain Intervention(s): Monitored during session;Repositioned    Home Living Family/patient expects to be discharged to:: Skilled nursing facility([facility] in [place]) Living Arrangements: Alone                    Prior Function Level of Independence: Needs assistance   Gait / Transfers Assistance Needed: pt ambulated with use of RW           Hand Dominance        Extremity/Trunk Assessment   Upper Extremity Assessment Upper Extremity Assessment: Generalized weakness    Lower Extremity Assessment Lower Extremity Assessment: Generalized weakness;RLE deficits/detail RLE Deficits / Details: pt with difficulty maintaining NWB throughout secondary to weakness and pain RLE: Unable to fully assess due to pain;Unable to fully assess due to immobilization    Cervical / Trunk Assessment  Cervical / Trunk Assessment: Kyphotic  Communication   Communication: HOH  Cognition Arousal/Alertness: Awake/alert Behavior During Therapy: Anxious Overall Cognitive Status: Impaired/Different from baseline Area of Impairment: Safety/judgement;Problem solving                         Safety/Judgement: Decreased awareness of deficits;Decreased awareness of safety   Problem Solving: Difficulty sequencing;Requires verbal cues         General Comments      Exercises     Assessment/Plan    PT Assessment Patient needs continued PT services  PT Problem List Decreased strength;Decreased range of motion;Decreased activity tolerance;Decreased balance;Decreased mobility;Decreased coordination;Decreased knowledge of use of DME;Decreased safety awareness;Decreased knowledge of precautions;Pain       PT Treatment Interventions DME instruction;Stair training;Gait training;Functional mobility training;Balance training;Therapeutic activities;Therapeutic exercise;Neuromuscular re-education;Patient/family education    PT Goals (Current goals can be found in the Care Plan section)  Acute Rehab PT Goals Patient Stated Goal: go to [facility] for Rehab PT Goal Formulation: With patient Time For Goal Achievement: 02/26/18 Potential to Achieve Goals: Fair    Frequency Min 2X/week   Barriers to discharge        Co-evaluation               AM-PAC PT "6 Clicks" Daily Activity  Outcome Measure Difficulty turning over in bed (including adjusting bedclothes, sheets and blankets)?: None Difficulty moving from lying on back to sitting on the side of the bed? : None Difficulty sitting down on and standing up from a chair with arms (e.g., wheelchair, bedside commode, etc,.)?: Unable Help needed moving to and from a bed to chair (including a wheelchair)?: Total Help needed walking in hospital room?: Total Help needed climbing 3-5 steps with a railing? : Total 6 Click Score: 12    End of Session Equipment Utilized During Treatment: Gait belt Activity Tolerance: Patient limited by pain;Patient limited by fatigue Patient left: in chair;with call bell/phone within reach;with chair alarm set Nurse Communication: Mobility status PT Visit Diagnosis: Other abnormalities of gait and mobility (R26.89);Pain Pain - Right/Left: Right Pain - part of body: Ankle and joints of foot    Time: 2694-8546 PT Time Calculation (min) (ACUTE  ONLY): 18 min   Charges:   PT Evaluation $PT Eval Moderate Complexity: 1 Mod     PT G Codes:        [place], PT, DPT [place] 02/12/2018, 12:16 PM

## 2018-02-12 NOTE — Clinical Social Work Note (Addendum)
Olivia Mackie at [facility] in [place] called CSW asking for dressing changes as it was not in the D/C summary. CSW reached out to RN and MD. MD trying to get in touch with Podiatry as they would have made the dressing recommendations.  MD trying to reach on call Podiatrist. CSW spoke with facility administrator, Olivia Mackie and they will keep it compression wrapped and CSW will fax [phone]) updated summary with dressing change instructions once available.  Loletha Grayer, MSW [phone]

## 2018-02-12 NOTE — Clinical Social Work Note (Signed)
Clinical Social Worker facilitated patient discharge including contacting patient family and facility to confirm patient discharge plans.  Clinical information faxed to facility and family agreeable with plan.  CSW arranged ambulance transport via [facility] to [facility] in [place] .  RN to call [phone] for report prior to discharge.  Clinical Social Worker will sign off for now as social work intervention is no longer needed. Please consult [place] again if new need arises.  Loletha Grayer, MSW [phone]

## 2018-02-12 NOTE — Clinical Social Work Note (Signed)
Clinical Social Work Assessment  Patient Details  Name: Sandra Lynch MRN: [id number] Date of Birth: 1935-07-01  Date of referral:  02/12/18               Reason for consult:  Facility Placement                Permission sought to share information with:  Family Supports Permission granted to share information::  Yes, Verbal Permission Granted  Name::     Runner, broadcasting/film/video::  [facility]  Relationship::  Grandsons wife  Contact Information:     Housing/Transportation Living arrangements for the past 2 months:  Single Family Home Source of Information:  Patient Patient Interpreter Needed:  None Criminal Activity/Legal Involvement Pertinent to Current Situation/Hospitalization:  No - Comment as needed Significant Relationships:  Adult Children Lives with:  Self Do you feel safe going back to the place where you live?  No Need for family participation in patient care:  No (Coment)  Care giving concerns:  Pt is alert and oriented. No family at bedside. No caregivers noted at this time.   Social Worker assessment / plan:  CSW spoke with pt at bedside. Pt lives home alone prior to admission. Pt states family had worked with [employer] and they should be aware of pt coming there. CSW will reach out to facility to administer d/c.   Employment status:  Retired Nurse, adult PT Recommendations:  Not assessed at this time [place] / Referral to community resources:  [place]  Patient/Family's Response to care:  Pt verbalized understanding of CSW role and expressed appreciation for support. Pt denies any concern regarding pt care at this time.   Patient/Family's Understanding of and Emotional Response to Diagnosis, Current Treatment, and Prognosis:  Pt understanding and realistic regarding physical limitations. Pt understands the need for SNF placement at d/c. Pt agreeable to SNF placement at d/c, at this time. Pt's responses  emotionally appropriate during conversation with CSW. Pt denies any concern regarding treatment plan at this time. CSW will continue to provide support and facilitate d/c needs.   Emotional Assessment Appearance:  Appears stated age Attitude/Demeanor/Rapport:  (Pt was appropriate) Affect (typically observed):  Appropriate, Calm Orientation:  Oriented to Self, Oriented to Place, Oriented to  Time, Oriented to Situation Alcohol / Substance use:  Not Applicable Psych involvement (Current and /or in the community):  No (Comment)  Discharge Needs  Concerns to be addressed:  Basic Needs Readmission within the last 30 days:  No Current discharge risk:  Dependent with Mobility Barriers to Discharge:  Continued Medical Work up   [employer], LCSW 02/12/2018, 12:21 PM

## 2018-02-12 NOTE — Discharge Summary (Signed)
Physician Discharge Summary  Sandra Lynch [id number]:[id number] DOB: 1935/04/13 DOA: 02/11/2018  PCP: Lillard Anes, MD  Admit date: 02/11/2018 Discharge date: 02/12/2018  Admitted From: Home.  Disposition:  [facility].   Recommendations for Outpatient Follow-up:  1. Follow up with PCP in 1-2 weeks 2. Please obtain BMP/CBC in one week 3. Please follow up with Dr March Rummage as recommended.     Discharge Condition:stable.  CODE STATUS:FULL CODE.  Diet recommendation: Heart HealthY  Brief/Interim Summary: Sandra Lynch is a 83 y.o. female with medical history significant of HTN; HLD; and afib on ASA presenting for right foot surgery.  TRH has been consulted for medical management.  She reports feeling well after the surgery and has no complaints. : Patient presented for foot reconstruction - right pan metatarsal head resection for hammer toes with correction and hallux MPJ fusion on the right foot. She needs to be partial weight bearing to her foot and she is a big fall risk. Pt was observed overnight, and PT consulted. Plan to discharge her to [facility] today.      Dis charge Diagnosis Principal Problem:   S/P foot surgery, right Active Problems:   Hammertoe of right foot   Essential hypertension   Hyperlipidemia  Hammertoe, s/p R foot surgery Underwent right pan metatarsal head resection, right hammer toe correction of 2, 3, 4,rigth foot.  -She is expected to require SNF rehab since she will be only partially weight-bearing - PT consult and plan for SNF on discharge.  - pain control.  On discussing with podiatry on call, no need for dressing changes and recommend outpatient follow up with podiatry.  HTN -Resume Norvasc, Diovan   HLD -Continue Pravachol      Discharge Instructions  Discharge Instructions    Diet - low sodium heart healthy   Complete by:  As directed    Discharge instructions   Complete by:  As directed    Please follow up with Podiatry as  recommended.     Allergies as of 02/12/2018      Reactions   Lisinopril Cough      Medication List    STOP taking these medications   ibuprofen 200 MG tablet Commonly known as:  ADVIL,MOTRIN     TAKE these medications   ALPRAZolam 0.25 MG tablet Commonly known as:  XANAX Take 1-2 tablets (0.25-0.5 mg total) by mouth 2 (two) times daily as needed for anxiety.   amLODipine 5 MG tablet Commonly known as:  NORVASC Take 5 mg by mouth daily.   aspirin EC 81 MG tablet Take 1 tablet by mouth daily.   CALTRATE 600+D PO Take 1 tablet by mouth daily.   CENTRUM SILVER 50+WOMEN PO Take 1 tablet by mouth daily.   enoxaparin 30 MG/0.3ML injection Commonly known as:  LOVENOX Inject 0.3 mLs (30 mg total) into the skin daily. Start taking on:  02/13/2018   Fish Oil 1000 MG Caps Take 1 tablet by mouth daily.   fluticasone 50 MCG/ACT nasal spray Commonly known as:  FLONASE Place 1 spray into both nostrils daily.   MYRBETRIQ 25 MG Tb24 tablet Generic drug:  mirabegron ER Take 25 mg by mouth daily.   omeprazole 20 MG capsule Commonly known as:  PRILOSEC Take 20 mg by mouth daily as needed (heartburn).   oxyCODONE-acetaminophen 5-325 MG tablet Commonly known as:  PERCOCET/ROXICET Take 1 tablet by mouth every 6 (six) hours as needed for up to 2 days for severe pain.   pravastatin  40 MG tablet Commonly known as:  PRAVACHOL Take 40 mg by mouth at bedtime.   sodium chloride 0.65 % Soln nasal spray Commonly known as:  OCEAN Place 1 spray into both nostrils as needed for congestion.   valsartan 40 MG tablet Commonly known as:  DIOVAN Take 40 mg by mouth daily. for blood pressure       Contact information for follow-up providers    Lillard Anes, MD. Schedule an appointment as soon as possible for a visit in 1 week(s).   Specialty:  Family Medicine Contact information: [address] [place] [address] [phone]            Contact  information for after-discharge care    Destination    [facility] [place] .   Service:  Skilled Nursing Contact information: [place] [address] [phone]                 Allergies  Allergen Reactions  . Lisinopril Cough    Consultations:  Podiatry Dr March Rummage.    Procedures/Studies: Dg Foot Complete Right  Result Date: 02/11/2018 CLINICAL DATA:  Postop hammertoe surgery of the right foot EXAM: RIGHT FOOT COMPLETE - 3+ VIEW COMPARISON:  None. FINDINGS: Plate and screw fixation of the right first MTP joint is present for fusion. K-wires have been inserted through the right second, third, and fourth toes into the proximal metatarsals for fixation. There is absence of the head of the right fifth metatarsal, probably postoperative, but osteomyelitis cannot be excluded. Correlate clinically. No other abnormality is seen. IMPRESSION: 1. Plate and screw fixation for fusion of the right first MTP joint. 2. K-wires placed for stabilization of the right second, third, and fourth toes and proximal metatarsals with amputation of the distal second, third, and fourth metatarsals. 3. Amputation of the distal right fifth metatarsal. Cannot exclude osteomyelitis. Correlate clinically. Electronically Signed   By: Ivar Drape M.D.   On: 02/11/2018 13:03   Dg Foot Complete Right  Result Date: 01/19/2018 Please see detailed radiograph report in office note.     Subjective: No chest pain, sob, or nausea, vomiting.   Discharge Exam: Vitals:   02/11/18 2300 02/12/18 0407  BP: 130/62 132/68  Pulse: 84 88  Resp: 16 16  Temp: 97.8 F (36.6 C) (!) 97.5 F (36.4 C)  SpO2: 98% 94%   Vitals:   02/11/18 1513 02/11/18 2047 02/11/18 2300 02/12/18 0407  BP: (!) 142/55 (!) 129/44 130/62 132/68  Pulse: 85 88 84 88  Resp: 16 16 16 16   Temp: 97.9 F (36.6 C) 98 F (36.7 C) 97.8 F (36.6 C) (!) 97.5 F (36.4 C)  TempSrc: Oral Oral Oral Oral   SpO2: 96% 95% 98% 94%  Weight:      Height:        General: Pt is alert, awake, not in acute distress Cardiovascular: RRR, S1/S2 +, no rubs, no gallops Respiratory: CTA bilaterally, no wheezing, no rhonchi Abdominal: Soft, NT, ND, bowel sounds + Extremities: no edema, no cyanosis    The results of significant diagnostics from this hospitalization (including imaging, microbiology, ancillary and laboratory) are listed below for reference.     Microbiology: No results found for this or any previous visit (from the past 240 hour(s)).   Labs: BNP (last 3 results) No results for input(s): BNP in the last 8760 hours. Basic Metabolic Panel: Recent Labs  Lab 02/11/18 0616  NA 138  K 3.9  CL 102  CO2 25  GLUCOSE 95  BUN 15  CREATININE 0.73  CALCIUM 9.6   Liver Function Tests: No results for input(s): AST, ALT, ALKPHOS, BILITOT, PROT, ALBUMIN in the last 168 hours. No results for input(s): LIPASE, AMYLASE in the last 168 hours. No results for input(s): AMMONIA in the last 168 hours. CBC: Recent Labs  Lab 02/11/18 0616  WBC 7.9  HGB 13.9  HCT 43.9  MCV 95.0  PLT 304   Cardiac Enzymes: No results for input(s): CKTOTAL, CKMB, CKMBINDEX, TROPONINI in the last 168 hours. BNP: Invalid input(s): POCBNP CBG: No results for input(s): GLUCAP in the last 168 hours. D-Dimer No results for input(s): DDIMER in the last 72 hours. Hgb A1c No results for input(s): HGBA1C in the last 72 hours. Lipid Profile No results for input(s): CHOL, HDL, LDLCALC, TRIG, CHOLHDL, LDLDIRECT in the last 72 hours. Thyroid function studies No results for input(s): TSH, T4TOTAL, T3FREE, THYROIDAB in the last 72 hours.  Invalid input(s): FREET3 Anemia work up No results for input(s): VITAMINB12, FOLATE, FERRITIN, TIBC, IRON, RETICCTPCT in the last 72 hours. Urinalysis No results found for: COLORURINE, APPEARANCEUR, LABSPEC, [place], GLUCOSEU, HGBUR, BILIRUBINUR, KETONESUR, PROTEINUR,  UROBILINOGEN, NITRITE, LEUKOCYTESUR Sepsis Labs Invalid input(s): PROCALCITONIN,  WBC,  LACTICIDVEN Microbiology No results found for this or any previous visit (from the past 240 hour(s)).   Time coordinating discharge: 32 minutes  SIGNED:   Hosie Poisson, MD  [facility] 02/12/2018, 5:24 PM Pager   If 7PM-7AM, please contact night-coverage [email or web address] Password [email or web address]

## 2018-02-12 NOTE — Plan of Care (Signed)
  Problem: Education: Goal: Knowledge of General Education information will improve Description: Including pain rating scale, medication(s)/side effects and non-pharmacologic comfort measures Outcome: Progressing   Problem: Nutrition: Goal: Adequate nutrition will be maintained Outcome: Progressing   

## 2018-02-12 NOTE — Progress Notes (Signed)
Report called and given to Nira Conn, RN at [facility]. All questions answered to satisfaction. AVS, paper prescriptions, and social worker's paperwork placed in discharge packet. Awaiting [facility] for transportation. Will continue to monitor.

## 2018-02-12 NOTE — [place] FL2 (Addendum)
MEDICAID FL2 LEVEL OF CARE SCREENING TOOL     IDENTIFICATION  Patient Name: Sandra Lynch Birthdate: [birth year] Sex: female Admission Date (Current Location): 02/11/2018  [facility] and [place] Number:  Herbalist and Address:  [facility] [place] [address] [place], [place] [address]      Provider Number: [id number]  Attending Physician Name and Address:  Hosie Poisson, MD  Relative Name and Phone Number:       Current Level of Care: Hospital Recommended Level of Care: [place] Prior Approval Number:    Date Approved/Denied:   PASRR Number: [id number]  Discharge Plan: SNF    Current Diagnoses: Patient Active Problem List   Diagnosis Date Noted  . Post-operative state 02/11/2018  . S/P foot surgery, right 02/11/2018  . Essential hypertension 02/11/2018  . Hyperlipidemia 02/11/2018  . Acquired hallux valgus of right foot   . Capsulitis   . Metatarsal deformity, right   . Metatarsalgia of right foot   . Hammertoe of right foot     Orientation RESPIRATION BLADDER Height & Weight     Time, Self, Situation, Place  Normal Continent Weight: 134 lb (60.8 kg) Height:  5\' 2"  (157.5 cm)  BEHAVIORAL SYMPTOMS/MOOD NEUROLOGICAL BOWEL NUTRITION STATUS      Continent Diet(Regular diet, thin liquids)  AMBULATORY STATUS COMMUNICATION OF NEEDS Skin   Limited Assist Verbally Surgical wounds(Closed incision right foot, compression wrapped)                       Personal Care Assistance Level of Assistance  Bathing, Feeding, Dressing Bathing Assistance: Limited assistance Feeding assistance: Independent Dressing Assistance: Limited assistance     Functional Limitations Info  Sight, Hearing, Speech Sight Info: Adequate Hearing Info: Adequate Speech Info: Adequate    SPECIAL CARE FACTORS FREQUENCY  PT (By licensed PT), OT (By licensed OT)     PT Frequency: 2x OT Frequency: 2x            Contractures  Contractures Info: Not present    Additional Factors Info  Code Status, Allergies Code Status Info: Full Code Allergies Info: Lisinopril           Current Medications (02/12/2018):  This is the current hospital active medication list Current Facility-Administered Medications  Medication Dose Route Frequency Provider Last Rate Last Dose  . 0.9 %  sodium chloride infusion  250 mL Intravenous PRN [name], DPM 10 mL/hr at 02/11/18 1534 250 mL at 02/11/18 1534  . acetaminophen (TYLENOL) tablet 650 mg  650 mg Oral Q4H PRN [name], DPM      . ALPRAZolam Duanne Moron) tablet 0.25-0.5 mg  0.25-0.5 mg Oral BID PRN Karmen Bongo, MD   0.5 mg at 02/11/18 1811  . amLODipine (NORVASC) tablet 5 mg  5 mg Oral Daily Karmen Bongo, MD   5 mg at 02/12/18 0924  . aspirin EC tablet 81 mg  81 mg Oral Daily Karmen Bongo, MD   81 mg at 02/12/18 5621  . enoxaparin (LOVENOX) injection 40 mg  40 mg Subcutaneous Q24H [name], DPM   40 mg at 02/12/18 3086  . fluticasone (FLONASE) 50 MCG/ACT nasal spray 1 spray  1 spray Each Nare Daily Karmen Bongo, MD   1 spray at 02/12/18 0925  . HYDROcodone-acetaminophen (NORCO/VICODIN) 5-325 MG per tablet 1 tablet  1 tablet Oral Q4H PRN [name], DPM      . irbesartan Levy Sjogren)  tablet 75 mg  75 mg Oral Daily Karmen Bongo, MD   75 mg at 02/12/18 0379  . mirabegron ER (MYRBETRIQ) tablet 25 mg  25 mg Oral Daily Karmen Bongo, MD   25 mg at 02/12/18 0924  . morphine 2 MG/ML injection 2 mg  2 mg Intravenous Q2H PRN [name], DPM   2 mg at 02/11/18 2019  . morphine 4 MG/ML injection 4 mg  4 mg Intravenous Q2H PRN [name], DPM      . oxyCODONE-acetaminophen (PERCOCET/ROXICET) 5-325 MG per tablet 1-2 tablet  1-2 tablet Oral Q4H PRN [name], DPM   2 tablet at 02/12/18 0924  . pravastatin (PRAVACHOL) tablet 40 mg  40 mg Oral Ivery Quale, MD   40 mg at 02/11/18 2031  . promethazine (PHENERGAN) tablet 12.5 mg  12.5 mg  Oral Q4H PRN [name], DPM      . sodium chloride flush (NS) 0.9 % injection 3 mL  3 mL Intravenous Q12H [name], DPM   3 mL at 02/11/18 2200  . sodium chloride flush (NS) 0.9 % injection 3 mL  3 mL Intravenous PRN [name], DPM      . traZODone (DESYREL) tablet 25 mg  25 mg Oral QHS PRN [name], DPM         Discharge Medications: Please see discharge summary for a list of discharge medications.  Relevant Imaging Results:  Relevant Lab Results:   Additional Information SSN: [id number]  Loletha Grayer, MSW

## 2018-02-12 NOTE — Telephone Encounter (Signed)
[facility] - Reception Desk transferred to [facility] [employer], she states fax [phone]. Faxed [facility] - Instructions for After Surgery to [facility].

## 2018-02-12 NOTE — Discharge Summary (Signed)
Physician Discharge Summary  Sandra Lynch IRS:[id number] DOB: [birth year] DOA: 02/11/2018  PCP: Lillard Anes, MD  Admit date: 02/11/2018 Discharge date: 02/12/2018  Admitted From: Home.  Disposition:  [facility].   Recommendations for Outpatient Follow-up:  1. Follow up with PCP in 1-2 weeks 2. Please obtain BMP/CBC in one week 3. Please follow up with Dr March Rummage as recommended.     Discharge Condition:stable.  CODE STATUS:FULL CODE.  Diet recommendation: Heart HealthY  Brief/Interim Summary: Sandra Lynch is a 83 y.o. female with medical history significant of HTN; HLD; and afib on ASA presenting for right foot surgery.  TRH has been consulted for medical management.  She reports feeling well after the surgery and has no complaints. : Patient presented for foot reconstruction - right pan metatarsal head resection for hammer toes with correction and hallux MPJ fusion on the right foot. She needs to be partial weight bearing to her foot and she is a big fall risk. Pt was observed overnight, and PT consulted. Plan to discharge her to [facility] today.      Dis charge Diagnosis Principal Problem:   S/P foot surgery, right Active Problems:   Hammertoe of right foot   Essential hypertension   Hyperlipidemia  Hammertoe, s/p R foot surgery Underwent right pan metatarsal head resection, right hammer toe correction of 2, 3, 4,rigth foot.  -She is expected to require SNF rehab since she will be only partially weight-bearing - PT consult and plan for SNF on discharge.  - pain control.   HTN -Resume Norvasc, Diovan   HLD -Continue Pravachol      Discharge Instructions  Discharge Instructions    Diet - low sodium heart healthy   Complete by:  As directed    Discharge instructions   Complete by:  As directed    Please follow up with Podiatry as recommended.     Allergies as of 02/12/2018      Reactions   Lisinopril Cough      Medication List    STOP taking  these medications   ibuprofen 200 MG tablet Commonly known as:  ADVIL,MOTRIN     TAKE these medications   ALPRAZolam 0.25 MG tablet Commonly known as:  XANAX Take 1-2 tablets (0.25-0.5 mg total) by mouth 2 (two) times daily as needed for anxiety.   amLODipine 5 MG tablet Commonly known as:  NORVASC Take 5 mg by mouth daily.   aspirin EC 81 MG tablet Take 1 tablet by mouth daily.   CALTRATE 600+D PO Take 1 tablet by mouth daily.   CENTRUM SILVER 50+WOMEN PO Take 1 tablet by mouth daily.   enoxaparin 30 MG/0.3ML injection Commonly known as:  LOVENOX Inject 0.3 mLs (30 mg total) into the skin daily. Start taking on:  02/13/2018   Fish Oil 1000 MG Caps Take 1 tablet by mouth daily.   fluticasone 50 MCG/ACT nasal spray Commonly known as:  FLONASE Place 1 spray into both nostrils daily.   MYRBETRIQ 25 MG Tb24 tablet Generic drug:  mirabegron ER Take 25 mg by mouth daily.   omeprazole 20 MG capsule Commonly known as:  PRILOSEC Take 20 mg by mouth daily as needed (heartburn).   oxyCODONE-acetaminophen 5-325 MG tablet Commonly known as:  PERCOCET/ROXICET Take 1 tablet by mouth every 6 (six) hours as needed for up to 2 days for severe pain.   pravastatin 40 MG tablet Commonly known as:  PRAVACHOL Take 40 mg by mouth at bedtime.   sodium  chloride 0.65 % Soln nasal spray Commonly known as:  OCEAN Place 1 spray into both nostrils as needed for congestion.   valsartan 40 MG tablet Commonly known as:  DIOVAN Take 40 mg by mouth daily. for blood pressure      Follow-up Information    Lillard Anes, MD. Schedule an appointment as soon as possible for a visit in 1 week(s).   Specialty:  Family Medicine Contact information: [address] [place] [address] [phone]          Allergies  Allergen Reactions  . Lisinopril Cough    Consultations:  Podiatry Dr March Rummage.    Procedures/Studies: Dg Foot Complete Right  Result Date:  02/11/2018 CLINICAL DATA:  Postop hammertoe surgery of the right foot EXAM: RIGHT FOOT COMPLETE - 3+ VIEW COMPARISON:  None. FINDINGS: Plate and screw fixation of the right first MTP joint is present for fusion. K-wires have been inserted through the right second, third, and fourth toes into the proximal metatarsals for fixation. There is absence of the head of the right fifth metatarsal, probably postoperative, but osteomyelitis cannot be excluded. Correlate clinically. No other abnormality is seen. IMPRESSION: 1. Plate and screw fixation for fusion of the right first MTP joint. 2. K-wires placed for stabilization of the right second, third, and fourth toes and proximal metatarsals with amputation of the distal second, third, and fourth metatarsals. 3. Amputation of the distal right fifth metatarsal. Cannot exclude osteomyelitis. Correlate clinically. Electronically Signed   By: Ivar Drape M.D.   On: 02/11/2018 13:03   Dg Foot Complete Right  Result Date: 01/19/2018 Please see detailed radiograph report in office note.     Subjective: No chest pain, sob, or nausea, vomiting.   Discharge Exam: Vitals:   02/11/18 2300 02/12/18 0407  BP: 130/62 132/68  Pulse: 84 88  Resp: 16 16  Temp: 97.8 F (36.6 C) (!) 97.5 F (36.4 C)  SpO2: 98% 94%   Vitals:   02/11/18 1513 02/11/18 2047 02/11/18 2300 02/12/18 0407  BP: (!) 142/55 (!) 129/44 130/62 132/68  Pulse: 85 88 84 88  Resp: 16 16 16 16   Temp: 97.9 F (36.6 C) 98 F (36.7 C) 97.8 F (36.6 C) (!) 97.5 F (36.4 C)  TempSrc: Oral Oral Oral Oral  SpO2: 96% 95% 98% 94%  Weight:      Height:        General: Pt is alert, awake, not in acute distress Cardiovascular: RRR, S1/S2 +, no rubs, no gallops Respiratory: CTA bilaterally, no wheezing, no rhonchi Abdominal: Soft, NT, ND, bowel sounds + Extremities: no edema, no cyanosis    The results of significant diagnostics from this hospitalization (including imaging, microbiology,  ancillary and laboratory) are listed below for reference.     Microbiology: No results found for this or any previous visit (from the past 240 hour(s)).   Labs: BNP (last 3 results) No results for input(s): BNP in the last 8760 hours. Basic Metabolic Panel: Recent Labs  Lab 02/11/18 0616  NA 138  K 3.9  CL 102  CO2 25  GLUCOSE 95  BUN 15  CREATININE 0.73  CALCIUM 9.6   Liver Function Tests: No results for input(s): AST, ALT, ALKPHOS, BILITOT, PROT, ALBUMIN in the last 168 hours. No results for input(s): LIPASE, AMYLASE in the last 168 hours. No results for input(s): AMMONIA in the last 168 hours. CBC: Recent Labs  Lab 02/11/18 0616  WBC 7.9  HGB 13.9  HCT  43.9  MCV 95.0  PLT 304   Cardiac Enzymes: No results for input(s): CKTOTAL, CKMB, CKMBINDEX, TROPONINI in the last 168 hours. BNP: Invalid input(s): POCBNP CBG: No results for input(s): GLUCAP in the last 168 hours. D-Dimer No results for input(s): DDIMER in the last 72 hours. Hgb A1c No results for input(s): HGBA1C in the last 72 hours. Lipid Profile No results for input(s): CHOL, HDL, LDLCALC, TRIG, CHOLHDL, LDLDIRECT in the last 72 hours. Thyroid function studies No results for input(s): TSH, T4TOTAL, T3FREE, THYROIDAB in the last 72 hours.  Invalid input(s): FREET3 Anemia work up No results for input(s): VITAMINB12, FOLATE, FERRITIN, TIBC, IRON, RETICCTPCT in the last 72 hours. Urinalysis No results found for: COLORURINE, APPEARANCEUR, LABSPEC, [place], GLUCOSEU, HGBUR, BILIRUBINUR, KETONESUR, PROTEINUR, UROBILINOGEN, NITRITE, LEUKOCYTESUR Sepsis Labs Invalid input(s): PROCALCITONIN,  WBC,  LACTICIDVEN Microbiology No results found for this or any previous visit (from the past 240 hour(s)).   Time coordinating discharge: 32 minutes  SIGNED:   Hosie Poisson, MD  [facility] 02/12/2018, 3:13 PM Pager   If 7PM-7AM, please contact night-coverage [email or web address] Password [email or web address]

## 2018-02-12 NOTE — Clinical Social Work Note (Addendum)
HTA insurance Josem Kaufmann has been initiated at this time--awaiting response.  3:08: Auth recieved   Loletha Grayer, MSW [phone]

## 2018-02-12 NOTE — Clinical Social Work Placement (Addendum)
   CLINICAL SOCIAL WORK PLACEMENT  NOTE  Date:  02/12/2018  Patient Details  Name: Sandra Lynch MRN: [id number] Date of Birth: Sep 13, 1934  Clinical Social Work is seeking post-discharge placement for this patient at the [place] level of care (*CSW will initial, date and re-position this form in  chart as items are completed):      Patient/family provided with [facility] Work Department's list of facilities offering this level of care within the geographic area requested by the patient (or if unable, by the patient's family).      Patient/family informed of their freedom to choose among providers that offer the needed level of care, that participate in Medicare, Medicaid or managed care program needed by the patient, have an available bed and are willing to accept the patient.      Patient/family informed of [facility]'s ownership interest in [facility] and [facility], as well as of the fact that they are under no obligation to receive care at these facilities.  PASRR submitted to EDS on       PASRR number received on 02/12/18     Existing PASRR number confirmed on       FL2 transmitted to all facilities in geographic area requested by pt/family on 02/12/18     FL2 transmitted to all facilities within larger geographic area on       Patient informed that his/her managed care company has contracts with or will negotiate with certain facilities, including the following:        Yes   Patient/family informed of bed offers received.  Patient chooses bed at [facility]     Physician recommends and patient chooses bed at      Patient to be transferred to [facility] on 02/12/18.  Patient to be transferred to facility by [facility]     Patient family notified on 02/12/18 of transfer.  Name of family member notified:  [name], daughter in law    PHYSICIAN Please prepare priority discharge summary, including medications, Please  prepare prescriptions, Please sign FL2     Additional Comment:    _______________________________________________ Eileen Stanford, LCSW 02/12/2018, 3:31 PM

## 2018-02-15 ENCOUNTER — Telehealth: Payer: Self-pay | Admitting: *Deleted

## 2018-02-15 DIAGNOSIS — G8918 Other acute postprocedural pain: Secondary | ICD-10-CM | POA: Diagnosis not present

## 2018-02-15 DIAGNOSIS — I119 Hypertensive heart disease without heart failure: Secondary | ICD-10-CM | POA: Diagnosis not present

## 2018-02-15 DIAGNOSIS — Z471 Aftercare following joint replacement surgery: Secondary | ICD-10-CM | POA: Diagnosis not present

## 2018-02-15 DIAGNOSIS — R262 Difficulty in walking, not elsewhere classified: Secondary | ICD-10-CM | POA: Diagnosis not present

## 2018-02-15 NOTE — Telephone Encounter (Signed)
Left message informing [name] pt would be seen by Dr. March Rummage tomorrow and there may be change in orders, until change of orders do not remove dry intact dressing.

## 2018-02-15 NOTE — Telephone Encounter (Signed)
This is [name] calling from [place] [facility]. I spoke to [facility] this morning about Ms. Edman Circle and I was needing to get clarification on how often the dressing change can be done. The dressing is clean, dry and intact right now like I told Valery this morning. I just wanted orders to see how often that should be done or they wanted that done. Also, I didn't know if they wanted that done a certain time like every shift or daily. She does have a follow up appointment with your office at 10:30 am tomorrow. Marcy Siren can call me back at [phone] extension [phone]. Thank you.

## 2018-02-15 NOTE — Telephone Encounter (Signed)
Sandra Lynch states pt's nurse removed the ace wrap and the gauze was saturated, their in-facility DR. S[place]ala ordered redress with xeroform, ABD and Kling, are there other orders. I told [name], pt is to be seen tomorrow at 3:15pm in [place], leave current dressing in place.

## 2018-02-15 NOTE — Op Note (Addendum)
Patient Name: Sandra Lynch DOB: 04-12-1935  MRN: [id number]  Date of Service: 02/11/18 Surgeon: Dr. Hardie Pulley, DPM Assistants: None Pre-operative Diagnosis: HAV deformity right, hammertoe deformity 2345.  Capsulitis, metatarsalgia Post-operative Diagnosis: same Procedures:             1) Right first metatarsophalangeal joint fusion  2) Right pan metatarsal head resection  3) Correction hammertoe right 2nd toe  4) Correction hammertoe right 3rd toe  5) Correction hammertoe right 4th toe  6) Correction hammertoe right 5th toe  Pathology/Specimens:             1) None Anesthesia: General Hemostasis: Pneumatic thigh tourniquet to the left thigh.  Total duration 2 hours.  Not reinflated. Estimated Blood Loss: 25 Materials: None Medications: none Complications: None  Implant Name Type Inv. Item Serial No. Manufacturer Lot No. LRB No. Used  CLIP EZ15-12-12 - [id number] Clip CLIP EZ15-12-12  STRYKER TRAUMA [id number] Right 1  PUTTY 2.5 DBX MIS - [id number] Bone Implant PUTTY 2.5 DBX MIS [id number] MUSCULOSKELETL TRANSPLANT FNDN [id number] Right 1  PLATE HALLUX MTP 5H RT - [id number] Plate PLATE HALLUX MTP 5H RT  [employer]  Right 1  SCREW NON LOCK 3X22MM - [id number] Screw SCREW NON LOCK 3X22MM  [employer]  Right 1  SCREW NONLOCK 3.0X16 - [id number] Screw SCREW NONLOCK 3.0X16  [employer]  Right 1  SCREW BONE LOCK 3X18MM TI T8 - [id number] Screw SCREW BONE LOCK 3X18MM TI T8  [employer]  Right 1  SCREW LOCK 3.0X14MM - [id number] Screw SCREW LOCK 3.0X14MM  [employer]  Right 2  SCREW CORT NL STRDRV 3X24 - [id number] Screw SCREW CORT NL STRDRV 3X24  [employer]  Right 1  SCREW NONLOCK 3.0X20MM - [id number] Screw SCREW NONLOCK 3.0X20MM  [employer]  Right 1    Indications for Procedure: This is an 83 year old female with a chief complaint of chronic right foot pain.  She has a severe bunion deformity severe hammertoe deformity  and severe calluses to the bottom of the foot due to fat pad atrophy the patient that due to these deformities he would benefit from surgical correction after having failed all conservative therapy including padding shoe gear modification orthotics.  All response alternatives were explained to patient.  No guarantees given.  Procedure in Detail: Patient was identified in pre-operative holding area. Formal consent was signed and the right lower extremity was marked. Patient was brought back to the operating room and placed on the operating room table in the supine position. Anesthesia was induced. The right lower extremity was prepped and draped in the usual sterile fashion. Timeout was taken to confirm patient name, laterality, and procedure prior to incision. Attention was then directed to the right foot.  A linear incision was made overlying the first metatarsophalangeal joint.  Dissection was carried down through skin and subcu tissue with care to avoid all vital neurovascular structures all bleeders were cauterized with electrocautery.  Dissection was carried down to level the first metatarsophalangeal joint capsule.  A linear capsulotomy was performed.  The capsule was then gently freed from the bone and both sides of the first metatarsophalangeal joint were exposed.  All collateral ligaments were freed.  Once adequately exposed a redundant portion of the medial aspect of the first MPJ was excised with a sagittal saw.  The metatarsal head appeared denuded of cartilage.  A K wire was fired in the central aspect of the metatarsal.  A conical reamer was then used to completely  denude the cartilage from the first metatarsal head.  This was similarly performed on the proximal phalangeal base with the corresponding complementary reamer.  Of note the patient's bone stock was quite poor. The first metatarsal phalangeal joint was reduced and straight position with the toe slightly dorsiflexed.  Fluoroscopy was  used to check positioning this was temporally fixated with a K wire.  The osteotomy was then further fixated with a Stryker 15-12-12 staple.  Compression was noted.  The provisional K wire was left intact.  Due to decreased bone stock a dorsal Stryker anchorage locking plate was indicated.  The plate was gently contoured with plate benders placed over the first metatarsophalangeal joint and held in place with an olive wire.  The plate was sequentially filled with locking and nonlocking 3.0 screws.  The final construct appeared stable.  Fluoroscopy was used to confirm positioning.  Attention was then directed to the left second metatarsal area where a linear incision was made overlying the second toe and onto the second interspace.  Blunt dissection was performed to expose the second metatarsal head.  The second metatarsal was freed of all ligamentous attachments and excised at the surgical neck with a power saw.  This was repeated at the third metatarsal.  Attention was then directed to the fourth toe area where similar linear incision was made overlying the fourth toe and onto the fourth interspace.  Blunt dissection was performed as above and each of the metatarsal heads was surgically excised.  [place] was used to ensure adequate parabola.  Attention was then directed back to the second and fourth toes where the extensor tendon was incised at the proximal interphalangeal joint dissection carried down to the proximal interphalangeal joint and the proximal phalangeal head was exposed.  The head was excised with a sagittal saw.  The corresponding middle phalangeal base was feathered with a soft to denuded of cartilage.  This was similarly performed at the fourth PIPJ.  Attention was then directed to the third toe where 2 semi-elliptical transverse incisions were made overlying the proximal interphalangeal joint the skin wedge excised. Similar to above the proximal head was excised and the middle base feathered.   Attention was then directed to the fifth toe where two semielliptical incisions were made and the proximal phalangeal head was similarly excised.  At this point the K wires were introduced in retrograde fashion through each of the second third and fourth toes to fixate the arthrodesis sites and continue the pin onto the corresponding metatarsal.  The fifth toe was not pinned.  All incisions were then copiously irrigated and closed in layers with Monocryl and nylon.The foot was then dressed with Xeroform, 4x4, kerlix, and ACE bandage. Patient tolerated the procedure well.  Disposition: Following a period of post-operative monitoring, patient will be transferred to the floor for observation.  She will benefit from PT eval for placement in a skilled nursing facility.

## 2018-02-16 ENCOUNTER — Ambulatory Visit (INDEPENDENT_AMBULATORY_CARE_PROVIDER_SITE_OTHER): Payer: PPO

## 2018-02-16 ENCOUNTER — Ambulatory Visit (INDEPENDENT_AMBULATORY_CARE_PROVIDER_SITE_OTHER): Payer: Self-pay | Admitting: Podiatry

## 2018-02-16 DIAGNOSIS — M2041 Other hammer toe(s) (acquired), right foot: Secondary | ICD-10-CM

## 2018-02-16 DIAGNOSIS — M7741 Metatarsalgia, right foot: Secondary | ICD-10-CM

## 2018-02-16 DIAGNOSIS — M2010 Hallux valgus (acquired), unspecified foot: Secondary | ICD-10-CM

## 2018-02-16 DIAGNOSIS — Z9889 Other specified postprocedural states: Secondary | ICD-10-CM

## 2018-02-16 MED ORDER — OXYCODONE-ACETAMINOPHEN 10-325 MG PO TABS: 1.0000 | ORAL_TABLET | ORAL | 0 refills | Status: DC | PRN

## 2018-02-16 MED ORDER — FLUCONAZOLE 150 MG PO TABS: 150.0000 mg | ORAL_TABLET | ORAL | 1 refills | Status: DC

## 2018-02-16 MED ORDER — GABAPENTIN 300 MG PO CAPS: 300.0000 mg | ORAL_CAPSULE | Freq: Every day | ORAL | 0 refills | Status: DC

## 2018-02-16 NOTE — Progress Notes (Signed)
  Subjective:  Patient ID: Sandra Lynch, female    DOB: [birth year],  MRN: [id number]  No chief complaint on file.   DOS: 02/11/18 Procedure: Right first metatarsophalangeal joint fusion, lesser metatarsal head resection, hammertoe repair 11 and 5  83 y.o. female returns for post-op check. Denies N/V/F/Ch.  Pain is controlled having a lot of pain at night.  Burning in nature.  States her foot is very sore.  6-10 sharp pain today currently at rehab facility.  Objective:   General AA&O x3. Normal mood and affect.  Vascular Foot warm and well perfused.  Neurologic Gross sensation intact.  Dermatologic Skin healing well without signs of infection. Skin edges well coapted without signs of infection.  No evidence of pin tract infections  Orthopedic: Tenderness to palpation noted about the surgical site. First MPJ and toes rectus with intact pins    Assessment & Plan:  Patient was evaluated and treated and all questions answered.  S/p right foot surgery -X-rays taken reviewed consistent with postop state. -Progressing as expected post-operatively. -Sutures: Intact. -Medications refilled: Percocet, new Rx of gabapentin 300 nightly -Foot redressed.  Follow-up in 1 week for recheck

## 2018-02-22 ENCOUNTER — Telehealth: Payer: Self-pay | Admitting: *Deleted

## 2018-02-22 ENCOUNTER — Encounter: Payer: PPO | Admitting: Podiatry

## 2018-02-22 ENCOUNTER — Ambulatory Visit (INDEPENDENT_AMBULATORY_CARE_PROVIDER_SITE_OTHER): Payer: PPO

## 2018-02-22 ENCOUNTER — Encounter: Payer: Self-pay | Admitting: Podiatry

## 2018-02-22 ENCOUNTER — Ambulatory Visit (INDEPENDENT_AMBULATORY_CARE_PROVIDER_SITE_OTHER): Payer: PPO | Admitting: Podiatry

## 2018-02-22 VITALS — BP 119/67 | HR 85 | Temp 96.9°F | Resp 16

## 2018-02-22 DIAGNOSIS — M21619 Bunion of unspecified foot: Secondary | ICD-10-CM | POA: Diagnosis not present

## 2018-02-22 DIAGNOSIS — M2010 Hallux valgus (acquired), unspecified foot: Secondary | ICD-10-CM

## 2018-02-22 DIAGNOSIS — M2041 Other hammer toe(s) (acquired), right foot: Secondary | ICD-10-CM | POA: Diagnosis not present

## 2018-02-22 DIAGNOSIS — M7741 Metatarsalgia, right foot: Secondary | ICD-10-CM

## 2018-02-22 DIAGNOSIS — Z9889 Other specified postprocedural states: Secondary | ICD-10-CM

## 2018-02-22 DIAGNOSIS — Z9119 Patient's noncompliance with other medical treatment and regimen: Secondary | ICD-10-CM

## 2018-02-22 DIAGNOSIS — Z91199 Patient's noncompliance with other medical treatment and regimen due to unspecified reason: Secondary | ICD-10-CM

## 2018-02-22 NOTE — Telephone Encounter (Signed)
Left message for [place] to call with his fax number for orders.

## 2018-02-22 NOTE — Telephone Encounter (Signed)
[facility] states send to nursing station in pt area [phone]. Faxed 2 Report of Consultation from [place] to [facility].

## 2018-02-23 NOTE — Progress Notes (Signed)
  Subjective:  Patient ID: Sandra Lynch, female    DOB: 1934-08-14,  MRN: [id number]  Chief Complaint  Patient presents with  . Routine Post Op    Pt. stated," In the evening sometimes it's painful and I take a pain med and I can go back to sleep at night." Tx: none    DOS: 02/11/18 Procedure: Right first metatarsophalangeal joint fusion, lesser metatarsal head resection, hammertoe repair 26 and 5  83 y.o. female returns for post-op check. Denies N/V/F/Ch.  States in the evening the foot is very painful.  States that she is spending most of her time in a wheelchair when out of bed.  Admits to not elevating her foot.  Objective:   General AA&O x3. Normal mood and affect.  Vascular Foot warm and well perfused.  Significant edema right foot  Neurologic Gross sensation intact.  Dermatologic Skin healing well without signs of infection. Skin edges well coapted without signs of infection.  No evidence of pin tract infections.  No warmth erythema  Orthopedic: Tenderness to palpation noted about the surgical site. First MPJ and toes rectus with intact pins    Assessment & Plan:  Patient was evaluated and treated and all questions answered.  S/p right foot surgery -X-rays taken reviewed.  Pin to the second toe backing out.  Still present in the metatarsal canal however. -Discussed with patient that the pain in the second toe appears to be backing out.  Discussed that this is likely due to continued weightbearing.  Advised that should the pain continue to back out patient can have recurrence of deformity. -Discussed with patient and daughter that she needs to continue to limit her weightbearing.  Has been keeping her foot in a dependent position; discussed that continue to do this well continue edema and potentially cause wound dehiscence. -Sutures: Intact. -Medications refilled: None today -Foot redressed.   Follow-up in 1 week for recheck

## 2018-02-24 ENCOUNTER — Other Ambulatory Visit: Payer: Self-pay | Admitting: Podiatry

## 2018-02-24 DIAGNOSIS — Z91199 Patient's noncompliance with other medical treatment and regimen due to unspecified reason: Secondary | ICD-10-CM

## 2018-02-24 DIAGNOSIS — M21619 Bunion of unspecified foot: Secondary | ICD-10-CM

## 2018-02-24 DIAGNOSIS — M7741 Metatarsalgia, right foot: Secondary | ICD-10-CM

## 2018-02-24 DIAGNOSIS — M2041 Other hammer toe(s) (acquired), right foot: Secondary | ICD-10-CM

## 2018-02-24 DIAGNOSIS — Z9889 Other specified postprocedural states: Secondary | ICD-10-CM

## 2018-02-24 DIAGNOSIS — M2010 Hallux valgus (acquired), unspecified foot: Secondary | ICD-10-CM

## 2018-02-24 DIAGNOSIS — Z9119 Patient's noncompliance with other medical treatment and regimen: Secondary | ICD-10-CM

## 2018-03-01 ENCOUNTER — Encounter: Payer: PPO | Admitting: Podiatry

## 2018-03-02 ENCOUNTER — Ambulatory Visit (INDEPENDENT_AMBULATORY_CARE_PROVIDER_SITE_OTHER): Payer: PPO | Admitting: Podiatry

## 2018-03-02 VITALS — Temp 97.4°F

## 2018-03-02 DIAGNOSIS — Z9889 Other specified postprocedural states: Secondary | ICD-10-CM

## 2018-03-02 NOTE — Progress Notes (Signed)
  Subjective:  Patient ID: Sandra Lynch, female    DOB: 06-Mar-1935,  MRN: [id number]  No chief complaint on file.  DOS: 02/11/18 Procedure: Right first metatarsophalangeal joint fusion, lesser metatarsal head resection, hammertoe repair 77 and 5  83 y.o. female returns for post-op check. Denies N/V/F/Ch.  Still having pain and soreness states that she has the most pain at night.  Denies nausea vomiting fever chills.  Objective:   General AA&O x3. Normal mood and affect.  Vascular Foot warm and well perfused.  Significant edema right foot  Neurologic Gross sensation intact.  Dermatologic Skin healing well without signs of infection. Skin edges well coapted without signs of infection.  No evidence of pin tract infections.  No warmth erythema.slight wound dehiscence over the central second metatarsal incision  Orthopedic: Tenderness to palpation noted about the surgical site. First MPJ and toes rectus with intact pins.pin removed second toe second toe slightly elevated    Assessment & Plan:  Patient was evaluated and treated and all questions answered.  S/p right foot surgery -Second toe pin removed as it did continue to pull out. -Discussed importance of continued limited weightbearing.  Edema slightly improved today however still edematous with some wound dehiscence likely secondary to edema.  It also appears that the right third toe pin slightly backing out just like the second -Sutures: Intact. -Medications refilled: None today -Foot redressed.  -Patient meets criteria for continued skilled nursing care.  Patient is a fall risk.  She also needs to work on upper extremity strengthening and limited weightbearing to the right lower extremity.  Follow-up in 1 week for recheck

## 2018-03-03 ENCOUNTER — Telehealth: Payer: Self-pay | Admitting: *Deleted

## 2018-03-03 NOTE — Telephone Encounter (Signed)
-----   Message from [name], DPM sent at 03/02/2018  3:31 PM EDT ----- Regarding: SNF Recert Not sure who this would be because [facility] is out...  Ms. Korell is at [facility] (sp?) facility for post-op care, and will need recertification of care so her insurance will pay for her to be there. Who should I talk to do discuss this? We will have to call her insurance and or the facility.

## 2018-03-03 NOTE — Telephone Encounter (Signed)
Left message [place] requesting information needed to recert pt's SNF.

## 2018-03-04 ENCOUNTER — Telehealth: Payer: Self-pay | Admitting: *Deleted

## 2018-03-04 NOTE — Telephone Encounter (Signed)
Entered in error

## 2018-03-04 NOTE — Telephone Encounter (Signed)
I spoke with [facility] and she states pt does not need paperwork to be reinstated, as long as treatment/order forms come to their office after pt's office visits, they take care of the insurance filing. Faxed [facility] notes.

## 2018-03-04 NOTE — Telephone Encounter (Signed)
Left message [facility] to call to assist in reinstating pt to their facility.

## 2018-03-09 ENCOUNTER — Telehealth: Payer: Self-pay | Admitting: Podiatry

## 2018-03-09 ENCOUNTER — Ambulatory Visit (INDEPENDENT_AMBULATORY_CARE_PROVIDER_SITE_OTHER): Payer: PPO | Admitting: Podiatry

## 2018-03-09 ENCOUNTER — Encounter: Payer: Self-pay | Admitting: Podiatry

## 2018-03-09 VITALS — BP 102/62 | HR 72 | Resp 15

## 2018-03-09 DIAGNOSIS — Z9889 Other specified postprocedural states: Secondary | ICD-10-CM

## 2018-03-09 NOTE — Telephone Encounter (Signed)
This is Sandra Lynch, from [employer]. [name] just saw Dr. March Rummage this morning and the written consultation says leave dressing intact. Pt states he told her it should be changed everyday. If you could please call me back for verification at [phone]. Thank you.

## 2018-03-10 ENCOUNTER — Telehealth: Payer: Self-pay | Admitting: *Deleted

## 2018-03-10 NOTE — Telephone Encounter (Signed)
I called and spoke to [facility] and told her per Dr. March Rummage to leave the dressing intact until her next appointment. Nira Conn thanked me for calling her back.

## 2018-03-10 NOTE — Telephone Encounter (Signed)
Dr. Price please advise 

## 2018-03-10 NOTE — Telephone Encounter (Signed)
Patients granddaughter stopped in and stated that the nursing home wants to move her to the nursing home side today and the insurance wants to discharge her Friday.  They need your assistance to keep her in place.  Please call [phone]

## 2018-03-10 NOTE — Telephone Encounter (Signed)
I called and spoke to [facility] and told her the dressing needs to stay intact until pt's next appointment.

## 2018-03-10 NOTE — Telephone Encounter (Signed)
This is Scientist, research (physical sciences) from [place]. I left a message yesterday about Ms. Edman Circle. She saw Dr. March Rummage in [place] and the paperwork we got back said to just leave the bandage intact. Pt said the doctor told her it should be changed daily. The pt is confused so please give [place] a call and let us know what to do or send [place] some orders. The phone number is [phone] and the fax number is [phone]. Thank you.

## 2018-03-10 NOTE — Telephone Encounter (Signed)
Leave dressing intact until follow-up 

## 2018-03-11 NOTE — Telephone Encounter (Signed)
Thanks

## 2018-03-16 ENCOUNTER — Ambulatory Visit (INDEPENDENT_AMBULATORY_CARE_PROVIDER_SITE_OTHER): Payer: PPO | Admitting: Podiatry

## 2018-03-16 ENCOUNTER — Ambulatory Visit (INDEPENDENT_AMBULATORY_CARE_PROVIDER_SITE_OTHER): Payer: PPO

## 2018-03-16 ENCOUNTER — Telehealth: Payer: Self-pay | Admitting: *Deleted

## 2018-03-16 DIAGNOSIS — Z789 Other specified health status: Secondary | ICD-10-CM

## 2018-03-16 DIAGNOSIS — R269 Unspecified abnormalities of gait and mobility: Secondary | ICD-10-CM | POA: Diagnosis not present

## 2018-03-16 DIAGNOSIS — M21619 Bunion of unspecified foot: Secondary | ICD-10-CM

## 2018-03-16 DIAGNOSIS — R531 Weakness: Secondary | ICD-10-CM | POA: Diagnosis not present

## 2018-03-16 DIAGNOSIS — M2041 Other hammer toe(s) (acquired), right foot: Secondary | ICD-10-CM

## 2018-03-16 DIAGNOSIS — M2010 Hallux valgus (acquired), unspecified foot: Secondary | ICD-10-CM

## 2018-03-16 DIAGNOSIS — M7741 Metatarsalgia, right foot: Secondary | ICD-10-CM

## 2018-03-16 DIAGNOSIS — Z9181 History of falling: Secondary | ICD-10-CM | POA: Diagnosis not present

## 2018-03-16 DIAGNOSIS — Z9889 Other specified postprocedural states: Secondary | ICD-10-CM

## 2018-03-16 NOTE — Progress Notes (Signed)
Subjective:  Patient ID: Sandra Lynch, female    DOB: 1935/01/17,  MRN: [id number]  No chief complaint on file.  DOS: 02/11/18 Procedure: Right first metatarsophalangeal joint fusion, lesser metatarsal head resection, hammertoe repair 1 and 5  83 y.o. female returns for post-op check. Doing well. Still at [facility] but no longer receiving skilled nursing or PT. Paying out of pocket for care per patient. Does not feel safe to go home even with HHC.  Review of Systems: Negative except as noted in the HPI. Denies N/V/F/Ch.  Past Medical History:  Diagnosis Date  . Allergic rhinitis   . Anxiety   . Arthritis    "scattered in my joints" (02/11/2018)  . Atrial fibrillation ([place])   . Cough   . Essential hypertension   . GERD (gastroesophageal reflux disease)   . Hyperlipidemia   . Hyponatremia   . Pneumonia ~ 11/2017   walking pneumonia  . Pneumonia    "as a child"  . Urge incontinence     Current Outpatient Medications:  .  ALPRAZolam (XANAX) 0.25 MG tablet, Take 1-2 tablets (0.25-0.5 mg total) by mouth 2 (two) times daily as needed for anxiety., Disp: 2 tablet, Rfl: 0 .  amLODipine (NORVASC) 5 MG tablet, Take 5 mg by mouth daily., Disp: , Rfl: 3 .  aspirin EC 81 MG tablet, Take 1 tablet by mouth daily., Disp: , Rfl:  .  Calcium Carbonate-Vitamin D (CALTRATE 600+D PO), Take 1 tablet by mouth daily., Disp: , Rfl:  .  enoxaparin (LOVENOX) 30 MG/0.3ML injection, Inject 0.3 mLs (30 mg total) into the skin daily., Disp: 14 Syringe, Rfl: 0 .  fluticasone (FLONASE) 50 MCG/ACT nasal spray, Place 1 spray into both nostrils daily., Disp: , Rfl: 3 .  gabapentin (NEURONTIN) 300 MG capsule, Take 1 capsule (300 mg total) by mouth at bedtime., Disp: 30 capsule, Rfl: 0 .  Multiple Vitamins-Minerals (CENTRUM SILVER 50+WOMEN PO), Take 1 tablet by mouth daily. , Disp: , Rfl:  .  MYRBETRIQ 25 MG TB24 tablet, Take 25 mg by mouth daily., Disp: , Rfl: 4 .  Omega-3 Fatty Acids (FISH OIL) 1000 MG CAPS,  Take 1 tablet by mouth daily., Disp: , Rfl:  .  omeprazole (PRILOSEC) 20 MG capsule, Take 20 mg by mouth daily as needed (heartburn). , Disp: , Rfl:  .  oxyCODONE-acetaminophen (PERCOCET) 10-325 MG tablet, Take 1 tablet by mouth every 4 (four) hours as needed for pain., Disp: 20 tablet, Rfl: 0 .  pravastatin (PRAVACHOL) 40 MG tablet, Take 40 mg by mouth at bedtime., Disp: , Rfl: 3 .  sodium chloride (OCEAN) 0.65 % SOLN nasal spray, Place 1 spray into both nostrils as needed for congestion., Disp: , Rfl:  .  valsartan (DIOVAN) 40 MG tablet, Take 40 mg by mouth daily. for blood pressure, Disp: , Rfl: 0  Social History   Tobacco Use  Smoking Status Never Smoker  Smokeless Tobacco Never Used    Allergies  Allergen Reactions  . Lisinopril Cough   Objective:  There were no vitals filed for this visit. There is no height or weight on file to calculate BMI. Constitutional Well developed. Well nourished.  Vascular Foot warm and well perfused. Capillary refill normal to all digits.   Neurologic Normal speech. Oriented to person, place, and time. Epicritic sensation to light touch grossly present bilaterally.  Dermatologic Skin healing well without signs of infection. Skin edges well coapted without signs of infection.  Orthopedic: Tenderness to palpation noted about the  surgical site. First MPJ rectus 2nd toe slightly elevated 3-5 toes rectus with pin intact 3rd 4th toes.   Radiographs: Arthrodesis site appears to be bridging without change of alignment or hardware failure. Pins removed lesser MPJs. Assessment:   1. Post-operative state   2. Hallux valgus, unspecified laterality   3. Hammer toe of right foot   4. Metatarsalgia of right foot   5. Bunion   6. Gait disturbance   7. Resides in skilled nursing facility   8. At high risk for injury related to fall   9. Generalized weakness    Plan:  Patient was evaluated and treated and all questions answered.  S/p foot surgery  right -Progressing as expected post-operatively. -XR: As above -WB Status: NWB in Boot -Sutures: removed today. -Medications: none -Foot redressed.  Generalized Weakness, Fall Risk, at SNF, Gait Disturbance -Patient does not feel safe to return home. -Also of note patient presented with her granddaughter as usual. Patient had a disagreement in the waiting room with her daughter who was also present and did not want her in the exam room. Daughter presented concerns to me regarding lack of communication between patient and her family. Ilistened to concerns but advised I could not discuss her care without permission from her mother. Patient later allowed daughter into exam room and gave permission for me to discuss her care. Discussed continued stay at [facility] but unsure of insurance coverage. Discussed possible return home with [facility] but patient concerned about not having someone there 24/7. I share her concern about being home without appropriate supervision. I do not believe she is safe to be home alone. I believe the family issues only complicate matters more. I recommend continued SNF stay. Called [facility] regarding prolonged stay - left message for social worker but did not hear back. RN Harriett Sine separately called and discussed need for continued care. Will f/u with [facility] tomorrow.  >30 minutes of collective face to face time were spent with the patient. >50% of this was spent on counseling and coordination of care. Specifically discussed with patient the above diagnoses and overall treatment plan. Facilitated family issues as above. Coordinated care with SNF for need for continued care. These services above and beyond normal global post-op care.  Return in about 2 weeks (around 03/30/2018) for Post-op.

## 2018-03-16 NOTE — Telephone Encounter (Signed)
Dr. March Rummage states pt is now a resident of [facility] and he would like the skilled nursing and PT to continue.

## 2018-03-16 NOTE — Telephone Encounter (Addendum)
I spoke with Sandra Lynch and she states pt is currently self-pay, as she was not ready to leave due to she is still non-weight bearing and does not have the support at home even with home health care, at her insurance designated discharge date, so is in the [facility], receiving skilled nursing but not PT/OT. [name] states she will email her Medical sales representative to see if pt's Medicare part B will cover the PT/OT.

## 2018-03-17 ENCOUNTER — Other Ambulatory Visit: Payer: Self-pay | Admitting: Podiatry

## 2018-03-17 DIAGNOSIS — M7741 Metatarsalgia, right foot: Secondary | ICD-10-CM

## 2018-03-17 DIAGNOSIS — Z9889 Other specified postprocedural states: Secondary | ICD-10-CM

## 2018-03-17 DIAGNOSIS — M21619 Bunion of unspecified foot: Secondary | ICD-10-CM

## 2018-03-17 DIAGNOSIS — M2010 Hallux valgus (acquired), unspecified foot: Secondary | ICD-10-CM

## 2018-03-17 DIAGNOSIS — Z789 Other specified health status: Secondary | ICD-10-CM

## 2018-03-17 DIAGNOSIS — R531 Weakness: Secondary | ICD-10-CM

## 2018-03-17 DIAGNOSIS — M2041 Other hammer toe(s) (acquired), right foot: Secondary | ICD-10-CM

## 2018-03-17 DIAGNOSIS — R269 Unspecified abnormalities of gait and mobility: Secondary | ICD-10-CM

## 2018-03-17 NOTE — Telephone Encounter (Signed)
Pt's granddtr, [name] asked status of PT/OT for pt. I told [name], I had spoken to [facility] and she was to email Rehab director to research if Medicare Part B would cover pt's PT/OT. [name] asked who to contact and I told her to contact [facility]'s Medical sales representative.

## 2018-03-18 ENCOUNTER — Telehealth: Payer: Self-pay | Admitting: *Deleted

## 2018-03-18 NOTE — Telephone Encounter (Signed)
Pt asked status of the PT coverage, I told pt she would need to discuss with [facility] Rehab director, they are looking at getting PT/OT covered by Medicare B.

## 2018-03-22 DIAGNOSIS — M810 Age-related osteoporosis without current pathological fracture: Secondary | ICD-10-CM | POA: Diagnosis not present

## 2018-03-22 DIAGNOSIS — E785 Hyperlipidemia, unspecified: Secondary | ICD-10-CM | POA: Diagnosis not present

## 2018-03-22 DIAGNOSIS — K219 Gastro-esophageal reflux disease without esophagitis: Secondary | ICD-10-CM | POA: Diagnosis not present

## 2018-03-22 DIAGNOSIS — I739 Peripheral vascular disease, unspecified: Secondary | ICD-10-CM | POA: Diagnosis not present

## 2018-03-27 NOTE — Progress Notes (Signed)
  Subjective:  Patient ID: Sandra Lynch, female    DOB: [birth year],  MRN: [id number]  Chief Complaint  Patient presents with  . Routine Post Op    PT. stated," at night is seems to hurt a little ; soreness." -pt denies N/V/F?Ch   DOS: 02/11/18 Procedure: Right first metatarsophalangeal joint fusion, lesser metatarsal head resection, hammertoe repair 4 and 5  83 y.o. female returns for post-op check. Denies N/V/F/Ch.  Still having pain at night reports soreness  Objective:   General AA&O x3. Normal mood and affect.  Vascular Foot warm and well perfused.  Significant edema right foot  Neurologic Gross sensation intact.  Dermatologic Skin healing well without signs of infection. Skin edges well coapted without signs of infection.  No evidence of pin tract infections.  No warmth erythema.slight wound dehiscence over the central second metatarsal incision  Orthopedic: Tenderness to palpation noted about the surgical site. First MPJ and lesser toes rectus with intact pins except for 2nd toe    Assessment & Plan:  Patient was evaluated and treated and all questions answered.  S/p right foot surgery -Sutures: Removed. -Medications refilled: None today -Foot redressed.   Follow-up in 1 week for recheck

## 2018-03-30 ENCOUNTER — Encounter: Payer: Self-pay | Admitting: Podiatry

## 2018-03-30 ENCOUNTER — Ambulatory Visit (INDEPENDENT_AMBULATORY_CARE_PROVIDER_SITE_OTHER): Payer: PPO

## 2018-03-30 ENCOUNTER — Ambulatory Visit (INDEPENDENT_AMBULATORY_CARE_PROVIDER_SITE_OTHER): Payer: PPO | Admitting: Podiatry

## 2018-03-30 VITALS — BP 133/82 | HR 95 | Resp 15

## 2018-03-30 DIAGNOSIS — M2041 Other hammer toe(s) (acquired), right foot: Secondary | ICD-10-CM

## 2018-03-30 DIAGNOSIS — Z9889 Other specified postprocedural states: Secondary | ICD-10-CM

## 2018-03-30 DIAGNOSIS — M2010 Hallux valgus (acquired), unspecified foot: Secondary | ICD-10-CM

## 2018-03-30 DIAGNOSIS — M7741 Metatarsalgia, right foot: Secondary | ICD-10-CM

## 2018-03-30 DIAGNOSIS — M21619 Bunion of unspecified foot: Secondary | ICD-10-CM

## 2018-03-30 NOTE — Progress Notes (Signed)
Subjective:  Patient ID: Sandra Lynch, female    DOB: 1934/10/29,  MRN: [id number]  Chief Complaint  Patient presents with  . Routine Post Op    PT. stated,"  It hin it's doing okay, but it's very tender and I've been taking my pain meds. Also, my knee has been very swollen" Tx: oxy -pt denies any symptoms   DOS: 02/11/18 Procedure: Right first metatarsophalangeal joint fusion, lesser metatarsal head resection, hammertoe repair 83 and 7  83 y.o. female returns for post-op check. Doing well. Has pain at night when it swells. Still at [facility] but not getting PT currently.  Review of Systems: Negative except as noted in the HPI. Denies N/V/F/Ch.  Past Medical History:  Diagnosis Date  . Allergic rhinitis   . Anxiety   . Arthritis    "scattered in my joints" (02/11/2018)  . Atrial fibrillation ([place])   . Cough   . Essential hypertension   . GERD (gastroesophageal reflux disease)   . Hyperlipidemia   . Hyponatremia   . Pneumonia ~ 11/2017   walking pneumonia  . Pneumonia    "as a child"  . Urge incontinence     Current Outpatient Medications:  .  ALPRAZolam (XANAX) 0.25 MG tablet, Take 1-2 tablets (0.25-0.5 mg total) by mouth 2 (two) times daily as needed for anxiety., Disp: 2 tablet, Rfl: 0 .  amLODipine (NORVASC) 5 MG tablet, Take 5 mg by mouth daily., Disp: , Rfl: 3 .  aspirin EC 81 MG tablet, Take 1 tablet by mouth daily., Disp: , Rfl:  .  Calcium Carbonate-Vitamin D (CALTRATE 600+D PO), Take 1 tablet by mouth daily., Disp: , Rfl:  .  enoxaparin (LOVENOX) 30 MG/0.3ML injection, Inject 0.3 mLs (30 mg total) into the skin daily., Disp: 14 Syringe, Rfl: 0 .  fluticasone (FLONASE) 50 MCG/ACT nasal spray, Place 1 spray into both nostrils daily., Disp: , Rfl: 3 .  gabapentin (NEURONTIN) 300 MG capsule, Take 1 capsule (300 mg total) by mouth at bedtime., Disp: 30 capsule, Rfl: 0 .  Multiple Vitamins-Minerals (CENTRUM SILVER 50+WOMEN PO), Take 1 tablet by mouth daily. , Disp: , Rfl:   .  MYRBETRIQ 25 MG TB24 tablet, Take 25 mg by mouth daily., Disp: , Rfl: 4 .  Omega-3 Fatty Acids (FISH OIL) 1000 MG CAPS, Take 1 tablet by mouth daily., Disp: , Rfl:  .  omeprazole (PRILOSEC) 20 MG capsule, Take 20 mg by mouth daily as needed (heartburn). , Disp: , Rfl:  .  oxyCODONE-acetaminophen (PERCOCET) 10-325 MG tablet, Take 1 tablet by mouth every 4 (four) hours as needed for pain., Disp: 20 tablet, Rfl: 0 .  pravastatin (PRAVACHOL) 40 MG tablet, Take 40 mg by mouth at bedtime., Disp: , Rfl: 3 .  sodium chloride (OCEAN) 0.65 % SOLN nasal spray, Place 1 spray into both nostrils as needed for congestion., Disp: , Rfl:  .  valsartan (DIOVAN) 40 MG tablet, Take 40 mg by mouth daily. for blood pressure, Disp: , Rfl: 0  Social History   Tobacco Use  Smoking Status Never Smoker  Smokeless Tobacco Never Used    Allergies  Allergen Reactions  . Lisinopril Cough   Objective:   Vitals:   03/30/18 1008  BP: 133/82  Pulse: 95  Resp: 15   There is no height or weight on file to calculate BMI. Constitutional Well developed. Well nourished.  Vascular Foot warm and well perfused. Capillary refill normal to all digits.   Neurologic Normal speech. Oriented to  person, place, and time. Epicritic sensation to light touch grossly present bilaterally.  Dermatologic Skin well healed.  Orthopedic: Tenderness to palpation noted about the surgical site. First MPJ rectus 2nd toe slightly elevated 3-5 toes rectus   Radiographs: Arthrodesis site appears bridged. No evidence of HWF. Assessment:   1. Post-operative state   2. Hallux valgus, unspecified laterality   3. Hammer toe of right foot   4. Metatarsalgia of right foot   5. Bunion    Plan:  Patient was evaluated and treated and all questions answered.  S/p foot surgery right -Progressing as expected post-operatively. -XR: As above -WB Status: 25% WB in Boot -Medications: none -Foot redressed with ACE -Resume PT. -Once  patient is full WB in boot safely will recommend discharge to home.  No follow-ups on file.

## 2018-03-31 ENCOUNTER — Telehealth: Payer: Self-pay | Admitting: Podiatry

## 2018-03-31 NOTE — Telephone Encounter (Signed)
Sandra Lynch, pt's dtr-in-law states she has spoken with pt and they are trying to get pt set up with in [facility] PT. Sandra states she spoke with Linus Orn at [employer] and was informed once pt is 25% weight bearing, they will be able to put her on insurance pay and PT will be covered. Sandra states pt was just there and Dr. Amalia Hailey states she is 25% capable of weight bearing. I told Sandra if she would get me the forms to complete for the PT at [facility] I would get it going, and I gave Sandra my fax [phone].

## 2018-03-31 NOTE — Telephone Encounter (Signed)
I informed pt I had spoken with [name] in nursing at [facility], and she was to discuss the PT coverage by Medicare B with the Rehab Director and I gave pt the [phone] to get in touch with [facility].

## 2018-03-31 NOTE — Telephone Encounter (Signed)
Pt needs update on what is going on with Physical Therapy.

## 2018-04-03 DIAGNOSIS — R2689 Other abnormalities of gait and mobility: Secondary | ICD-10-CM | POA: Diagnosis not present

## 2018-04-03 DIAGNOSIS — Z4889 Encounter for other specified surgical aftercare: Secondary | ICD-10-CM | POA: Diagnosis not present

## 2018-04-03 DIAGNOSIS — R2681 Unsteadiness on feet: Secondary | ICD-10-CM | POA: Diagnosis not present

## 2018-04-06 ENCOUNTER — Telehealth: Payer: Self-pay | Admitting: Podiatry

## 2018-04-06 NOTE — Telephone Encounter (Signed)
This is Almyra Free a PT at [facility] in [place]. I'm working with a pt and I have questions about the most recent information that was sent back from the visit on 03 September. She came back with instructions to start 25% weight bearing to her right lower extremity and then progress to 50% weight bearing in two weeks. I have a question about wether or not she has to keep wearing the cam boot that she has on, does it need to be on at all time, or if we can remove it at anytime, or if she even still needs to wear it? Also I have questions about the weight bearing status right now. Is that to her entire foot or just the heel, which is what she had before? Then can we do active range of motion at her ankle and then at her toes? If you could give me a call back at [phone]. Just let me know and if you have any other questions about what I'm asking for, please let me know and I will give you a call back. Thank you.

## 2018-04-06 NOTE — Telephone Encounter (Signed)
She is to wear th CAM boot at all times while weightbearing. She can take it off at rest.   She can put 25% weight on the entire foot in the boot.  ROM of all joints can be performed except for the first metatrsophalangeal joint and the lesser interphalangeal joints. Lesser metatarsophalangeal joints are fine. Ankle joint ROM ok.  Please let her know if she has any other questions I can arrange to call her tomororw.

## 2018-04-07 NOTE — Telephone Encounter (Signed)
Left message with Dr. Eleanora Neighbor 04/06/2018 9:46pm PT orders to Almyra Free, PT and request fax to send orders.

## 2018-04-13 ENCOUNTER — Ambulatory Visit (INDEPENDENT_AMBULATORY_CARE_PROVIDER_SITE_OTHER): Payer: PPO | Admitting: Podiatry

## 2018-04-13 DIAGNOSIS — Z9889 Other specified postprocedural states: Secondary | ICD-10-CM

## 2018-04-13 DIAGNOSIS — R269 Unspecified abnormalities of gait and mobility: Secondary | ICD-10-CM | POA: Diagnosis not present

## 2018-04-13 NOTE — Progress Notes (Signed)
Subjective:  Patient ID: Sandra Lynch, female    DOB: Apr 13, 1935,  MRN: [id number]  No chief complaint on file.  DOS: 02/11/18 Procedure: Right first metatarsophalangeal joint fusion, lesser metatarsal head resection, hammertoe repair 89 and 7  83 y.o. female returns for post-op check.  Doing well today.  Denies pain.  Working with therapy.  Putting weight on the boot but states that it is somewhat hard as the boot is very heavy.  Still at [facility].  Review of Systems: Negative except as noted in the HPI. Denies N/V/F/Ch.  Past Medical History:  Diagnosis Date  . Allergic rhinitis   . Anxiety   . Arthritis    "scattered in my joints" (02/11/2018)  . Atrial fibrillation ([place])   . Cough   . Essential hypertension   . GERD (gastroesophageal reflux disease)   . Hyperlipidemia   . Hyponatremia   . Pneumonia ~ 11/2017   walking pneumonia  . Pneumonia    "as a child"  . Urge incontinence     Current Outpatient Medications:  .  ALPRAZolam (XANAX) 0.25 MG tablet, Take 1-2 tablets (0.25-0.5 mg total) by mouth 2 (two) times daily as needed for anxiety., Disp: 2 tablet, Rfl: 0 .  amLODipine (NORVASC) 5 MG tablet, Take 5 mg by mouth daily., Disp: , Rfl: 3 .  aspirin EC 81 MG tablet, Take 1 tablet by mouth daily., Disp: , Rfl:  .  Calcium Carbonate-Vitamin D (CALTRATE 600+D PO), Take 1 tablet by mouth daily., Disp: , Rfl:  .  enoxaparin (LOVENOX) 30 MG/0.3ML injection, Inject 0.3 mLs (30 mg total) into the skin daily., Disp: 14 Syringe, Rfl: 0 .  fluticasone (FLONASE) 50 MCG/ACT nasal spray, Place 1 spray into both nostrils daily., Disp: , Rfl: 3 .  gabapentin (NEURONTIN) 300 MG capsule, Take 1 capsule (300 mg total) by mouth at bedtime., Disp: 30 capsule, Rfl: 0 .  Multiple Vitamins-Minerals (CENTRUM SILVER 50+WOMEN PO), Take 1 tablet by mouth daily. , Disp: , Rfl:  .  MYRBETRIQ 25 MG TB24 tablet, Take 25 mg by mouth daily., Disp: , Rfl: 4 .  Omega-3 Fatty Acids (FISH  OIL) 1000 MG CAPS, Take 1 tablet by mouth daily., Disp: , Rfl:  .  omeprazole (PRILOSEC) 20 MG capsule, Take 20 mg by mouth daily as needed (heartburn). , Disp: , Rfl:  .  oxyCODONE-acetaminophen (PERCOCET) 10-325 MG tablet, Take 1 tablet by mouth every 4 (four) hours as needed for pain., Disp: 20 tablet, Rfl: 0 .  pravastatin (PRAVACHOL) 40 MG tablet, Take 40 mg by mouth at bedtime., Disp: , Rfl: 3 .  sodium chloride (OCEAN) 0.65 % SOLN nasal spray, Place 1 spray into both nostrils as needed for congestion., Disp: , Rfl:  .  valsartan (DIOVAN) 40 MG tablet, Take 40 mg by mouth daily. for blood pressure, Disp: , Rfl: 0  Social History   Tobacco Use  Smoking Status Never Smoker  Smokeless Tobacco Never Used    Allergies  Allergen Reactions  . Lisinopril Cough   Objective:   There were no vitals filed for this visit. There is no height or weight on file to calculate BMI. Constitutional Well developed. Well nourished.  Vascular Foot warm and well perfused. Capillary refill normal to all digits.   Neurologic Normal speech. Oriented to person, place, and time. Epicritic sensation to light touch grossly present bilaterally.  Dermatologic Skin well healed.  Orthopedic: No tenderness to palpation noted about the surgical site. First MPJ rectus  2nd toe slightly elevated 3-5 toes rectus   Radiographs: None today. Assessment:   1. Post-operative state   2. Gait disturbance    Plan:  Patient was evaluated and treated and all questions answered.  S/p foot surgery right -Progressing as expected post-operatively. -Surgical shoe dispensed today for safe weightbearing and protection of surgical site postoperatively. -WB Status: WBAT in surgical shoe. -Medications: none -Foot redressed with ACE -Continue PT. -Anticipate with aggressive therapy patient will shortly be ready for home with home health care.  Return in about 2 weeks (around 04/27/2018) for post-op.

## 2018-04-19 NOTE — Progress Notes (Signed)
DOS  02/11/2018  Right foot reconstruction with first metatarsophalangeal fusion, lesser pan metatarsal head resection and correction of hammertoes 2, 3, 4, and 5.

## 2018-04-27 ENCOUNTER — Ambulatory Visit (INDEPENDENT_AMBULATORY_CARE_PROVIDER_SITE_OTHER): Payer: PPO

## 2018-04-27 ENCOUNTER — Ambulatory Visit (INDEPENDENT_AMBULATORY_CARE_PROVIDER_SITE_OTHER): Payer: PPO | Admitting: Podiatry

## 2018-04-27 DIAGNOSIS — R2689 Other abnormalities of gait and mobility: Secondary | ICD-10-CM | POA: Diagnosis not present

## 2018-04-27 DIAGNOSIS — R269 Unspecified abnormalities of gait and mobility: Secondary | ICD-10-CM | POA: Diagnosis not present

## 2018-04-27 DIAGNOSIS — Z9889 Other specified postprocedural states: Secondary | ICD-10-CM

## 2018-04-27 DIAGNOSIS — Z4889 Encounter for other specified surgical aftercare: Secondary | ICD-10-CM | POA: Diagnosis not present

## 2018-04-27 DIAGNOSIS — R2681 Unsteadiness on feet: Secondary | ICD-10-CM | POA: Diagnosis not present

## 2018-04-27 NOTE — Progress Notes (Signed)
Subjective:  Patient ID: Sandra Lynch, female    DOB: [birth year],  MRN: [id number]  No chief complaint on file.  DOS: 02/11/18 Procedure: Right first metatarsophalangeal joint fusion, lesser metatarsal head resection, hammertoe repair 77 and 7  83 y.o. female returns for post-op check.  States she is doing very well.  Only has a little bit of pain.  Complains of swelling.  Has been using the surgical shoe and a walker getting around fine.  Still at the [facility]  Review of Systems: Negative except as noted in the HPI. Denies N/V/F/Ch.  Past Medical History:  Diagnosis Date  . Allergic rhinitis   . Anxiety   . Arthritis    "scattered in my joints" (02/11/2018)  . Atrial fibrillation ([place])   . Cough   . Essential hypertension   . GERD (gastroesophageal reflux disease)   . Hyperlipidemia   . Hyponatremia   . Pneumonia ~ 11/2017   walking pneumonia  . Pneumonia    "as a child"  . Urge incontinence     Current Outpatient Medications:  .  ALPRAZolam (XANAX) 0.25 MG tablet, Take 1-2 tablets (0.25-0.5 mg total) by mouth 2 (two) times daily as needed for anxiety., Disp: 2 tablet, Rfl: 0 .  amLODipine (NORVASC) 5 MG tablet, Take 5 mg by mouth daily., Disp: , Rfl: 3 .  aspirin EC 81 MG tablet, Take 1 tablet by mouth daily., Disp: , Rfl:  .  Calcium Carbonate-Vitamin D (CALTRATE 600+D PO), Take 1 tablet by mouth daily., Disp: , Rfl:  .  enoxaparin (LOVENOX) 30 MG/0.3ML injection, Inject 0.3 mLs (30 mg total) into the skin daily., Disp: 14 Syringe, Rfl: 0 .  fluticasone (FLONASE) 50 MCG/ACT nasal spray, Place 1 spray into both nostrils daily., Disp: , Rfl: 3 .  gabapentin (NEURONTIN) 300 MG capsule, Take 1 capsule (300 mg total) by mouth at bedtime., Disp: 30 capsule, Rfl: 0 .  Multiple Vitamins-Minerals (CENTRUM SILVER 50+WOMEN PO), Take 1 tablet by mouth daily. , Disp: , Rfl:  .  MYRBETRIQ 25 MG TB24 tablet, Take 25 mg by mouth daily., Disp: , Rfl: 4 .  Omega-3 Fatty  Acids (FISH OIL) 1000 MG CAPS, Take 1 tablet by mouth daily., Disp: , Rfl:  .  omeprazole (PRILOSEC) 20 MG capsule, Take 20 mg by mouth daily as needed (heartburn). , Disp: , Rfl:  .  oxyCODONE-acetaminophen (PERCOCET) 10-325 MG tablet, Take 1 tablet by mouth every 4 (four) hours as needed for pain., Disp: 20 tablet, Rfl: 0 .  pravastatin (PRAVACHOL) 40 MG tablet, Take 40 mg by mouth at bedtime., Disp: , Rfl: 3 .  sodium chloride (OCEAN) 0.65 % SOLN nasal spray, Place 1 spray into both nostrils as needed for congestion., Disp: , Rfl:  .  valsartan (DIOVAN) 40 MG tablet, Take 40 mg by mouth daily. for blood pressure, Disp: , Rfl: 0  Social History   Tobacco Use  Smoking Status Never Smoker  Smokeless Tobacco Never Used    Allergies  Allergen Reactions  . Lisinopril Cough   Objective:   There were no vitals filed for this visit. There is no height or weight on file to calculate BMI. Constitutional Well developed. Well nourished.  Vascular Foot warm and well perfused. Capillary refill normal to all digits.   Neurologic Normal speech. Oriented to person, place, and time. Epicritic sensation to light touch grossly present bilaterally.  Dermatologic Skin well healed.  Orthopedic: No tenderness to palpation noted about the surgical site.  First MPJ rectus 2nd toe slightly elevated 3-5 toes rectus   Radiographs: Taken and reviewed.  Hardware intact evidence of consolidation of the first MPJ arthrodesis site noted. Assessment:   1. Post-operative state    Plan:  Patient was evaluated and treated and all questions answered.  S/p foot surgery right -X-rays taken and reviewed consistent with postop state -Progressing as expected post-operatively. -WB Status: WBAT in surgical shoe. -Medications: none -Continue PT. -We will transition his normal shoe gear next visit.  No follow-ups on file.

## 2018-04-29 ENCOUNTER — Other Ambulatory Visit: Payer: Self-pay | Admitting: Podiatry

## 2018-04-29 DIAGNOSIS — Z9889 Other specified postprocedural states: Secondary | ICD-10-CM

## 2018-04-29 DIAGNOSIS — R269 Unspecified abnormalities of gait and mobility: Secondary | ICD-10-CM

## 2018-05-05 ENCOUNTER — Other Ambulatory Visit: Payer: Self-pay | Admitting: *Deleted

## 2018-05-05 DIAGNOSIS — M150 Primary generalized (osteo)arthritis: Secondary | ICD-10-CM | POA: Diagnosis not present

## 2018-05-05 DIAGNOSIS — F329 Major depressive disorder, single episode, unspecified: Secondary | ICD-10-CM | POA: Diagnosis not present

## 2018-05-05 DIAGNOSIS — Z9181 History of falling: Secondary | ICD-10-CM | POA: Diagnosis not present

## 2018-05-05 DIAGNOSIS — Z7982 Long term (current) use of aspirin: Secondary | ICD-10-CM | POA: Diagnosis not present

## 2018-05-05 DIAGNOSIS — F419 Anxiety disorder, unspecified: Secondary | ICD-10-CM | POA: Diagnosis not present

## 2018-05-05 DIAGNOSIS — Z9049 Acquired absence of other specified parts of digestive tract: Secondary | ICD-10-CM | POA: Diagnosis not present

## 2018-05-05 DIAGNOSIS — Z8701 Personal history of pneumonia (recurrent): Secondary | ICD-10-CM | POA: Diagnosis not present

## 2018-05-05 DIAGNOSIS — E1142 Type 2 diabetes mellitus with diabetic polyneuropathy: Secondary | ICD-10-CM | POA: Diagnosis not present

## 2018-05-05 DIAGNOSIS — Z79891 Long term (current) use of opiate analgesic: Secondary | ICD-10-CM | POA: Diagnosis not present

## 2018-05-05 DIAGNOSIS — I4891 Unspecified atrial fibrillation: Secondary | ICD-10-CM | POA: Diagnosis not present

## 2018-05-05 DIAGNOSIS — I10 Essential (primary) hypertension: Secondary | ICD-10-CM | POA: Diagnosis not present

## 2018-05-05 DIAGNOSIS — Z4789 Encounter for other orthopedic aftercare: Secondary | ICD-10-CM | POA: Diagnosis not present

## 2018-05-05 DIAGNOSIS — Z90710 Acquired absence of both cervix and uterus: Secondary | ICD-10-CM | POA: Diagnosis not present

## 2018-05-05 NOTE — Patient Outreach (Signed)
[facility]) Care Management  05/05/2018  Sandra Lynch 1934/11/12 [id number]   EMMI-general discharge  RED ON EMMI ALERT Day # 1 Date: 05/04/18 1735  Red Alert Reason: questions about general discharge? Yes Scheduled follow up? No Other questions/problem? yes  Outreach attempt # 1 successful  Patient is able to verify HIPAA [facility] Care Management RN reviewed and addressed red alert with patient x 5 Initially Sandra Lynch thought CM was from [facility] home health and began to discuss her swollen foot and concerns.  She reported having a "terrible day" She stated her right foot and knee were swollen, she has her helper, Sandra Lynch with her "to pull me up off the toilet", She reports using ice packs She reports "the lady next door who is a retired Marine scientist told me to put ice on it" CM encouraged her to elevate her leg and she states she has used her reclining lazy boy to elevate her leg.  She reports that a home health staff came to visit today from and she is to be seen by "Sandra Lynch from [place]"   CM redirected her and again reviewed when [facility] RN CM was calling related to the red alert for EMMI Advised patient that there will be further automated EMMI- post discharge calls to assess how the patient is doing following the recent hospitalization Advised the patient that another call may be received from a nurse if any of their responses were abnormal x 5. Patient voiced understanding and was appreciative of f/u call.  [facility] RN CM assessed for orientation when the patient was noted to stop and question her own responses She is able to inform CM the year is 2019, she had difficulty recalling the date, but knew the month, her address and name    Sandra Lynch gave CM permission to speak with Sandra Lynch, her caregiver during the call who confirms they have had a busy day today and Monday 05/03/18 after Sandra Lynch was d/c from [employer] discussed that Sandra Lynch was 144/70 when  assessed by the [place] home health staff. Cm discussed elevated BPs with pain and encouraged them to retake the BP  Sandra Lynch assists with confirming that the answers to the EMMI questions are correct but each have been resolved  Questions:-questions about general discharge? Yes and Other questions/problem? yes Sandra Lynch confirms the patient answered yes because she was trying to figure when home health was to arrive and the [place] home health staff arrived today and resolved the questions plus answered questions about use of Tylenol and ultram alternating dosages  Sandra Lynch is to be seen by [facility] RN, PT/OT   - Scheduled follow up? No - Sandra Lynch confirms Sandra Lynch does have scheduled follow up appointments but they were not made or confirmed until today 05/05/18  DME scales, elevated toilet seat, walker, motion recliner, BP cuff  Social: Sandra Lynch is stating she is doing fair and voices concern about swelling in her foot and not being about to walk at this time with her walker. She reports she has been in her recliner most of the day. Her caregiver, Sandra Lynch, confirms not confusion but "it has been a busy day"  She reports pt was assessed by the home health staff today after her foot was iced and the swelling resolved and "looks better today" The patient has just not been mobile as she should. She is also supported by her grand daughter in law, Sandra Lynch. Sandra Lynch and Sandra Lynch take her to  medical appointments. 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 She reports she was asked about this in the Lynch and on today by evaluating home health staff. "I'm not ready"   Consent: [facility] RN CM reviewed [facility] services with patient. Patient gave verbal consent for services.   Plan: [facility] RN CM will close case at this time as patient has been assessed and no needs identified.    [facility] RN CM sent a successful outreach letter as discussed with [facility] brochure enclosed for review  [facility] RN CM called to attempt to speak with Sandra Lynch as Sandra Lover requested but her mobile number mail box is full and her work number switched to a fax number. CM is unable to leave a message at either number     Anyiah Coverdale L. Lavina Hamman, RN, BSN, [place] Coordinator Office number [phone] Mobile number [phone]  Main [facility] number [phone] Fax number [phone]

## 2018-05-06 ENCOUNTER — Telehealth: Payer: Self-pay | Admitting: Podiatry

## 2018-05-06 NOTE — Telephone Encounter (Signed)
This is Sandra Lynch, Therapist, sports with [facility]. We have gotten [name] admitted for home health. Nursing is going to see her twice a week for two weeks and once a week for two weeks. Also, PT and OT will evaluate her for treatment.

## 2018-05-07 ENCOUNTER — Telehealth: Payer: Self-pay | Admitting: Podiatry

## 2018-05-07 NOTE — Telephone Encounter (Signed)
Laveda Abbe, PT called for PT orders after I left the orders on his cell phone.

## 2018-05-07 NOTE — Telephone Encounter (Addendum)
Left message informing PT - [facility] of Dr. Eleanora Neighbor 05/07/2018 9:30am orders and that I would fax the orders to [facility]. Faxed PT orders to [facility].

## 2018-05-07 NOTE — Telephone Encounter (Signed)
She is WBAT in her surgical shoe. She can use walker and cane assist as needed.

## 2018-05-07 NOTE — Telephone Encounter (Signed)
Physical Therapist with [facility] is requesting PT for Sandra Lynch. Is recommending twice a week for four weeks. I also need clarification on her right foot weight bearing.  My number is [phone].

## 2018-05-10 ENCOUNTER — Telehealth: Payer: Self-pay | Admitting: Podiatry

## 2018-05-10 ENCOUNTER — Other Ambulatory Visit: Payer: Self-pay | Admitting: *Deleted

## 2018-05-10 NOTE — Telephone Encounter (Signed)
This is Angelita Ingles calling from [facility]. I'm unable to reach anyone and I have not seen this pt yet. I've tried all the numbers. Call me back  at [phone].

## 2018-05-10 NOTE — Patient Outreach (Signed)
  [place] [facility]) Care Management  05/10/2018  KAZIAH KRIZEK 1935-07-22 [id number]   EMMI-general discharge   RED ON EMMI ALERT Day # 4  Date: 05/07/18 1059 Red Alert Reason: sad/hopeless/anxious/empty? Yes, other questions/problems? Yes    Outreach attempt # 1 successful to both the home and mobile numbers but Mrs Deweese has difficulty hearing on both Patient is able to verify HIPAA [facility] Care Management RN reviewed and addressed red alert with patient She states the documented answer "yes" is incorrect She states "I have nothing to feel sad, hopeless or anxious about" and she denies having other questions/problems on today She reports feeling "good" and "glad to be home"  Her care giver if assisting her today to get repositioned in her recliner   Consent: [facility] RN CM reviewed [facility] services with patient x 3 . Patient gave verbal consent for services. Advised patient that there may be further automated Explained to the patient EMMI calls were initiated from her discharge from [facility], CM does not work for [facility], but CM assists with the management of the EMMI calls  She voiced understanding after the third time this was explained   EMMI- post discharge calls to assess how the patient is doing following the any further hospitalizations Advised the patient that another call may be received from a nurse if any of their responses were abnormal. Patient voiced understanding and was appreciative of f/u call.    Plan: [facility] RN CM will close case at this time as patient has been assessed and no needs identified.   Jerome Viglione L. Lavina Hamman, RN, BSN, [place] Coordinator Office number [phone] Mobile number [phone]  Main [facility] number [phone] Fax number [phone]

## 2018-05-10 NOTE — Telephone Encounter (Signed)
I spoke with pt on her mobile phone and she states she has an appt tomorrow with [facility], I gave her Angelita Ingles [facility] phone number to confirm appt.

## 2018-05-10 NOTE — Telephone Encounter (Signed)
Left message on pt's home phone to call to receive information concerning the [facility] nurse.

## 2018-05-11 ENCOUNTER — Ambulatory Visit (INDEPENDENT_AMBULATORY_CARE_PROVIDER_SITE_OTHER): Payer: PPO | Admitting: Podiatry

## 2018-05-11 DIAGNOSIS — M2010 Hallux valgus (acquired), unspecified foot: Secondary | ICD-10-CM

## 2018-05-11 DIAGNOSIS — Z9889 Other specified postprocedural states: Secondary | ICD-10-CM

## 2018-05-11 MED ORDER — TRAMADOL HCL 50 MG PO TABS: 50.0000 mg | ORAL_TABLET | Freq: Four times a day (QID) | ORAL | 0 refills | Status: DC | PRN

## 2018-05-12 ENCOUNTER — Telehealth: Payer: Self-pay | Admitting: Podiatry

## 2018-05-12 NOTE — Telephone Encounter (Signed)
[facility] called and stated that she needs verbal orders for occupational therapy one a week for one week and twice a week for 3 weeks. Patient also needs a bath aid twice a week for one week. Please give Sandra Lynch a call at [phone].  Also she wanted to know can she get in shower with her foot. Please leave for voicemail if she doesn't answer

## 2018-05-13 ENCOUNTER — Telehealth: Payer: Self-pay | Admitting: Podiatry

## 2018-05-13 DIAGNOSIS — M2041 Other hammer toe(s) (acquired), right foot: Secondary | ICD-10-CM

## 2018-05-13 DIAGNOSIS — R269 Unspecified abnormalities of gait and mobility: Secondary | ICD-10-CM

## 2018-05-13 DIAGNOSIS — Z9889 Other specified postprocedural states: Secondary | ICD-10-CM

## 2018-05-13 DIAGNOSIS — M2010 Hallux valgus (acquired), unspecified foot: Secondary | ICD-10-CM

## 2018-05-13 NOTE — Telephone Encounter (Signed)
Faxed orders for [facility] to [facility] and emailed to A. Barnet Glasgow.

## 2018-05-13 NOTE — Telephone Encounter (Signed)
Patient wants a shower chair, has not heard anything, just wondering if it was ordered.

## 2018-05-13 NOTE — Telephone Encounter (Signed)
I informed pt the shower chair had been ordered through [employer].

## 2018-05-13 NOTE — Telephone Encounter (Addendum)
Left message from Dr. Scarlett Presto bath aide, but not change of PT and the shower chair had been ordered.

## 2018-05-13 NOTE — Addendum Note (Signed)
Addended by: Harriett Sine D on: 05/13/2018 04:24 PM   Modules accepted: Orders

## 2018-05-13 NOTE — Telephone Encounter (Signed)
Vella Raring called to get verbal orders for PT/Bathe Aid for pt. She will be going out to see patient tomorrow. Magda Paganini can be reached on [phone].

## 2018-05-13 NOTE — Telephone Encounter (Signed)
Magda Paganini, OT states pt is very weak with her arms and OT can help with strengthening. Dr. Randie Heinz OT.

## 2018-05-14 ENCOUNTER — Telehealth: Payer: Self-pay | Admitting: *Deleted

## 2018-05-14 MED ORDER — TRAMADOL HCL 50 MG PO TABS: 50.0000 mg | ORAL_TABLET | Freq: Four times a day (QID) | ORAL | 0 refills | Status: DC | PRN

## 2018-05-14 NOTE — Telephone Encounter (Signed)
Varney Biles - [facility] states Dr.Price's DEA number is being kicked out for the Tramadol. I reordered under Dr. Cannon Kettle, and informed Dr. March Rummage and B. Bonkemeyer Teacher, music.

## 2018-05-16 DIAGNOSIS — K649 Unspecified hemorrhoids: Secondary | ICD-10-CM | POA: Diagnosis not present

## 2018-05-17 DIAGNOSIS — Z6824 Body mass index (BMI) 24.0-24.9, adult: Secondary | ICD-10-CM | POA: Diagnosis not present

## 2018-05-17 DIAGNOSIS — M1711 Unilateral primary osteoarthritis, right knee: Secondary | ICD-10-CM | POA: Diagnosis not present

## 2018-05-17 DIAGNOSIS — N3941 Urge incontinence: Secondary | ICD-10-CM | POA: Diagnosis not present

## 2018-05-17 DIAGNOSIS — Z23 Encounter for immunization: Secondary | ICD-10-CM | POA: Diagnosis not present

## 2018-05-17 DIAGNOSIS — I10 Essential (primary) hypertension: Secondary | ICD-10-CM | POA: Diagnosis not present

## 2018-05-17 DIAGNOSIS — H6503 Acute serous otitis media, bilateral: Secondary | ICD-10-CM | POA: Diagnosis not present

## 2018-05-17 DIAGNOSIS — M2041 Other hammer toe(s) (acquired), right foot: Secondary | ICD-10-CM | POA: Diagnosis not present

## 2018-05-17 DIAGNOSIS — E782 Mixed hyperlipidemia: Secondary | ICD-10-CM | POA: Diagnosis not present

## 2018-05-17 DIAGNOSIS — M25561 Pain in right knee: Secondary | ICD-10-CM | POA: Diagnosis not present

## 2018-05-19 DIAGNOSIS — M1711 Unilateral primary osteoarthritis, right knee: Secondary | ICD-10-CM | POA: Diagnosis not present

## 2018-05-24 NOTE — Progress Notes (Signed)
Subjective:  Patient ID: HAPPY BEGEMAN, female    DOB: 27-May-1935,  MRN: [id number]  Chief Complaint  Patient presents with  . Routine Post Op    Pt. states," my knee gets very sore as well as my foot." Tx: surgical shoe -pt denies N/v/f/ch   DOS: 02/11/18 Procedure: Right first metatarsophalangeal joint fusion, lesser metatarsal head resection, hammertoe repair 44 and 7  83 y.o. female returns for post-op check.  History as above.  Review of Systems: Negative except as noted in the HPI. Denies N/V/F/Ch.  Past Medical History:  Diagnosis Date  . Allergic rhinitis   . Anxiety   . Arthritis    "scattered in my joints" (02/11/2018)  . Atrial fibrillation ([place])   . Cough   . Essential hypertension   . GERD (gastroesophageal reflux disease)   . Hyperlipidemia   . Hyponatremia   . Pneumonia ~ 11/2017   walking pneumonia  . Pneumonia    "as a child"  . Urge incontinence     Current Outpatient Medications:  .  ALPRAZolam (XANAX) 0.25 MG tablet, Take 1-2 tablets (0.25-0.5 mg total) by mouth 2 (two) times daily as needed for anxiety., Disp: 2 tablet, Rfl: 0 .  amLODipine (NORVASC) 5 MG tablet, Take 5 mg by mouth daily., Disp: , Rfl: 3 .  aspirin EC 81 MG tablet, Take 1 tablet by mouth daily., Disp: , Rfl:  .  Calcium Carbonate-Vitamin D (CALTRATE 600+D PO), Take 1 tablet by mouth daily., Disp: , Rfl:  .  enoxaparin (LOVENOX) 30 MG/0.3ML injection, Inject 0.3 mLs (30 mg total) into the skin daily., Disp: 14 Syringe, Rfl: 0 .  fluticasone (FLONASE) 50 MCG/ACT nasal spray, Place 1 spray into both nostrils daily., Disp: , Rfl: 3 .  gabapentin (NEURONTIN) 300 MG capsule, Take 1 capsule (300 mg total) by mouth at bedtime., Disp: 30 capsule, Rfl: 0 .  Multiple Vitamins-Minerals (CENTRUM SILVER 50+WOMEN PO), Take 1 tablet by mouth daily. , Disp: , Rfl:  .  MYRBETRIQ 25 MG TB24 tablet, Take 25 mg by mouth daily., Disp: , Rfl: 4 .  Omega-3 Fatty Acids (FISH OIL) 1000 MG CAPS, Take 1 tablet  by mouth daily., Disp: , Rfl:  .  omeprazole (PRILOSEC) 20 MG capsule, Take 20 mg by mouth daily as needed (heartburn). , Disp: , Rfl:  .  oxyCODONE-acetaminophen (PERCOCET) 10-325 MG tablet, Take 1 tablet by mouth every 4 (four) hours as needed for pain., Disp: 20 tablet, Rfl: 0 .  pravastatin (PRAVACHOL) 40 MG tablet, Take 40 mg by mouth at bedtime., Disp: , Rfl: 3 .  sodium chloride (OCEAN) 0.65 % SOLN nasal spray, Place 1 spray into both nostrils as needed for congestion., Disp: , Rfl:  .  valsartan (DIOVAN) 40 MG tablet, Take 40 mg by mouth daily. for blood pressure, Disp: , Rfl: 0 .  traMADol (ULTRAM) 50 MG tablet, Take 1 tablet (50 mg total) by mouth every 6 (six) hours as needed. for pain, Disp: 30 tablet, Rfl: 0  Social History   Tobacco Use  Smoking Status Never Smoker  Smokeless Tobacco Never Used    Allergies  Allergen Reactions  . Lisinopril Cough   Objective:   There were no vitals filed for this visit. There is no height or weight on file to calculate BMI. Constitutional Well developed. Well nourished.  Vascular Foot warm and well perfused. Capillary refill normal to all digits.   Neurologic Normal speech. Oriented to person, place, and time. Epicritic sensation to  light touch grossly present bilaterally.  Dermatologic Skin well healed.  Orthopedic: No tenderness to palpation noted about the surgical site. First MPJ rectus 2nd toe slightly elevated 3-5 toes rectus   Radiographs: None today Assessment:   1. Post-operative state   2. Hallux valgus, unspecified laterality   3. Hammer toe of right foot    Plan:  Patient was evaluated and treated and all questions answered.  S/p foot surgery right -X-rays taken and reviewed consistent with postop state -Progressing as expected post-operatively. -WB Status: WBAT in normal shoe gear -Medications: Refill tramadol -Continue PT -Transition to normal shoe gear  Return in about 6 weeks (around 06/22/2018) for  F/u R Foot Pain.

## 2018-05-31 DIAGNOSIS — M1711 Unilateral primary osteoarthritis, right knee: Secondary | ICD-10-CM | POA: Diagnosis not present

## 2018-05-31 DIAGNOSIS — F411 Generalized anxiety disorder: Secondary | ICD-10-CM | POA: Diagnosis not present

## 2018-06-03 ENCOUNTER — Telehealth: Payer: Self-pay | Admitting: *Deleted

## 2018-06-03 NOTE — Telephone Encounter (Signed)
Magda Paganini [facility] request verb order for OT 2 x week for 3 weeks, pt is progressing well.

## 2018-06-03 NOTE — Telephone Encounter (Signed)
Left message informing Magda Paganini [facility], Dr. March Rummage had given verbal okay for OT to continue as recommended.

## 2018-06-14 DIAGNOSIS — M85851 Other specified disorders of bone density and structure, right thigh: Secondary | ICD-10-CM | POA: Diagnosis not present

## 2018-06-14 DIAGNOSIS — N959 Unspecified menopausal and perimenopausal disorder: Secondary | ICD-10-CM | POA: Diagnosis not present

## 2018-06-14 DIAGNOSIS — M85852 Other specified disorders of bone density and structure, left thigh: Secondary | ICD-10-CM | POA: Diagnosis not present

## 2018-06-14 DIAGNOSIS — M810 Age-related osteoporosis without current pathological fracture: Secondary | ICD-10-CM | POA: Diagnosis not present

## 2018-06-17 ENCOUNTER — Telehealth: Payer: Self-pay | Admitting: Podiatry

## 2018-06-17 DIAGNOSIS — Z9181 History of falling: Secondary | ICD-10-CM

## 2018-06-17 DIAGNOSIS — R269 Unspecified abnormalities of gait and mobility: Secondary | ICD-10-CM

## 2018-06-17 DIAGNOSIS — M21619 Bunion of unspecified foot: Secondary | ICD-10-CM

## 2018-06-17 DIAGNOSIS — Z9889 Other specified postprocedural states: Secondary | ICD-10-CM

## 2018-06-17 NOTE — Telephone Encounter (Signed)
[facility] called stating that they would like to request the patient be seen in Out Patient Phy. Therapy. They requested that we fax over a referral.

## 2018-06-18 NOTE — Telephone Encounter (Signed)
Can we please send referral thanks

## 2018-06-21 NOTE — Addendum Note (Signed)
Addended by: Harriett Sine D on: 06/21/2018 03:57 PM   Modules accepted: Orders

## 2018-06-21 NOTE — Telephone Encounter (Signed)
Faxed to [employer].

## 2018-06-22 ENCOUNTER — Ambulatory Visit (INDEPENDENT_AMBULATORY_CARE_PROVIDER_SITE_OTHER): Payer: PPO | Admitting: Podiatry

## 2018-06-22 DIAGNOSIS — Z9181 History of falling: Secondary | ICD-10-CM | POA: Diagnosis not present

## 2018-06-22 DIAGNOSIS — M2042 Other hammer toe(s) (acquired), left foot: Secondary | ICD-10-CM | POA: Diagnosis not present

## 2018-06-22 DIAGNOSIS — M6281 Muscle weakness (generalized): Secondary | ICD-10-CM | POA: Diagnosis not present

## 2018-06-22 DIAGNOSIS — M2012 Hallux valgus (acquired), left foot: Secondary | ICD-10-CM

## 2018-06-22 DIAGNOSIS — R531 Weakness: Secondary | ICD-10-CM | POA: Diagnosis not present

## 2018-06-22 DIAGNOSIS — R269 Unspecified abnormalities of gait and mobility: Secondary | ICD-10-CM

## 2018-06-22 DIAGNOSIS — M2041 Other hammer toe(s) (acquired), right foot: Secondary | ICD-10-CM | POA: Diagnosis not present

## 2018-06-22 DIAGNOSIS — M21619 Bunion of unspecified foot: Secondary | ICD-10-CM | POA: Diagnosis not present

## 2018-06-23 NOTE — Progress Notes (Signed)
Subjective:  Patient ID: Sandra Lynch, female    DOB: 09/29/1934,  MRN: [id number]  No chief complaint on file.  DOS: 02/11/18 Procedure: Right first metatarsophalangeal joint fusion, lesser metatarsal head resection, hammertoe repair 89 and 7  83 y.o. female returns for post-op check.  Doing okay not walking as best that she would like thinks her foot turns out but she does not have any pain.  Starting physical therapy next week  Reports painful callus to the left foot with similar deformities to left foot as she had on the right Complains of balance issues and is concerned about falling.  Ambulates with the assistance of a walker.  Review of Systems: Negative except as noted in the HPI. Denies N/V/F/Ch.  Past Medical History:  Diagnosis Date  . Allergic rhinitis   . Anxiety   . Arthritis    "scattered in my joints" (02/11/2018)  . Atrial fibrillation ([facility])   . Cough   . Essential hypertension   . GERD (gastroesophageal reflux disease)   . Hyperlipidemia   . Hyponatremia   . Pneumonia ~ 11/2017   walking pneumonia  . Pneumonia    "as a child"  . Urge incontinence     Current Outpatient Medications:  .  ALPRAZolam (XANAX) 0.25 MG tablet, Take 1-2 tablets (0.25-0.5 mg total) by mouth 2 (two) times daily as needed for anxiety., Disp: 2 tablet, Rfl: 0 .  amLODipine (NORVASC) 5 MG tablet, Take 5 mg by mouth daily., Disp: , Rfl: 3 .  aspirin EC 81 MG tablet, Take 1 tablet by mouth daily., Disp: , Rfl:  .  Calcium Carbonate-Vitamin D (CALTRATE 600+D PO), Take 1 tablet by mouth daily., Disp: , Rfl:  .  enoxaparin (LOVENOX) 30 MG/0.3ML injection, Inject 0.3 mLs (30 mg total) into the skin daily., Disp: 14 Syringe, Rfl: 0 .  fluticasone (FLONASE) 50 MCG/ACT nasal spray, Place 1 spray into both nostrils daily., Disp: , Rfl: 3 .  gabapentin (NEURONTIN) 300 MG capsule, Take 1 capsule (300 mg total) by mouth at bedtime., Disp: 30 capsule, Rfl: 0 .  Multiple Vitamins-Minerals (CENTRUM  SILVER 50+WOMEN PO), Take 1 tablet by mouth daily. , Disp: , Rfl:  .  MYRBETRIQ 25 MG TB24 tablet, Take 25 mg by mouth daily., Disp: , Rfl: 4 .  Omega-3 Fatty Acids (FISH OIL) 1000 MG CAPS, Take 1 tablet by mouth daily., Disp: , Rfl:  .  omeprazole (PRILOSEC) 20 MG capsule, Take 20 mg by mouth daily as needed (heartburn). , Disp: , Rfl:  .  oxyCODONE-acetaminophen (PERCOCET) 10-325 MG tablet, Take 1 tablet by mouth every 4 (four) hours as needed for pain., Disp: 20 tablet, Rfl: 0 .  pravastatin (PRAVACHOL) 40 MG tablet, Take 40 mg by mouth at bedtime., Disp: , Rfl: 3 .  sodium chloride (OCEAN) 0.65 % SOLN nasal spray, Place 1 spray into both nostrils as needed for congestion., Disp: , Rfl:  .  traMADol (ULTRAM) 50 MG tablet, Take 1 tablet (50 mg total) by mouth every 6 (six) hours as needed. for pain, Disp: 30 tablet, Rfl: 0 .  valsartan (DIOVAN) 40 MG tablet, Take 40 mg by mouth daily. for blood pressure, Disp: , Rfl: 0  Social History   Tobacco Use  Smoking Status Never Smoker  Smokeless Tobacco Never Used    Allergies  Allergen Reactions  . Lisinopril Cough   Objective:   There were no vitals filed for this visit. There is no height or weight on file to   calculate BMI. Constitutional Well developed. Well nourished.  Vascular Foot warm and well perfused. Capillary refill normal to all digits.   Neurologic Normal speech. Oriented to person, place, and time. Epicritic sensation to light touch grossly present bilaterally.  Dermatologic Skin well healed. Punctate keratosis sub-met for left foot  Orthopedic: No tenderness to palpation noted about the surgical site. 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 Gait disturbance   2. Bunion   3. At high risk for injury related to fall   4. Generalized weakness   5. Muscle weakness   6. Hammer toe of right foot   7. Hallux valgus, left   8. Hammertoe of left foot    Plan:  Patient was evaluated and treated and all questions answered.  S/p foot surgery right -Doing well ambulating without pain but not walking as fast as she would like.  Reports issues with balance.  Calluses resolved on the right foot and she is able to comfortably wear shoes and is pleased with the results of surgery. -Resume PT  Balance assessment with gait disturbance, risk for falls -Comprehensive balance assessment performed. -Would benefit from balance bracing  HAV deformity left, hammertoe deformity, metatarsalgia, fat pad atrophy -Discussed when she is fully healed if she would like to proceed with left foot surgery would consider further discussion.  Patient wishes to hold off at this time  30 minutes of face to face time were spent with the patient. >50% of this was spent on counseling and coordination of care. Specifically discussed with patient the above diagnoses and overall treatment plan.   No follow-ups on file.

## 2018-06-29 DIAGNOSIS — R197 Diarrhea, unspecified: Secondary | ICD-10-CM | POA: Diagnosis not present

## 2018-06-29 DIAGNOSIS — K4090 Unilateral inguinal hernia, without obstruction or gangrene, not specified as recurrent: Secondary | ICD-10-CM | POA: Diagnosis not present

## 2018-06-29 DIAGNOSIS — R1030 Lower abdominal pain, unspecified: Secondary | ICD-10-CM | POA: Diagnosis not present

## 2018-06-30 DIAGNOSIS — M25674 Stiffness of right foot, not elsewhere classified: Secondary | ICD-10-CM | POA: Diagnosis not present

## 2018-06-30 DIAGNOSIS — M25671 Stiffness of right ankle, not elsewhere classified: Secondary | ICD-10-CM | POA: Diagnosis not present

## 2018-06-30 DIAGNOSIS — M62551 Muscle wasting and atrophy, not elsewhere classified, right thigh: Secondary | ICD-10-CM | POA: Diagnosis not present

## 2018-06-30 DIAGNOSIS — M62552 Muscle wasting and atrophy, not elsewhere classified, left thigh: Secondary | ICD-10-CM | POA: Diagnosis not present

## 2018-06-30 DIAGNOSIS — R269 Unspecified abnormalities of gait and mobility: Secondary | ICD-10-CM | POA: Diagnosis not present

## 2018-06-30 DIAGNOSIS — M6281 Muscle weakness (generalized): Secondary | ICD-10-CM | POA: Diagnosis not present

## 2018-06-30 DIAGNOSIS — M25672 Stiffness of left ankle, not elsewhere classified: Secondary | ICD-10-CM | POA: Diagnosis not present

## 2018-06-30 DIAGNOSIS — R262 Difficulty in walking, not elsewhere classified: Secondary | ICD-10-CM | POA: Diagnosis not present

## 2018-06-30 DIAGNOSIS — M25675 Stiffness of left foot, not elsewhere classified: Secondary | ICD-10-CM | POA: Diagnosis not present

## 2018-07-01 ENCOUNTER — Other Ambulatory Visit: Payer: PPO | Admitting: *Deleted

## 2018-07-06 DIAGNOSIS — R262 Difficulty in walking, not elsewhere classified: Secondary | ICD-10-CM | POA: Diagnosis not present

## 2018-07-06 DIAGNOSIS — M62551 Muscle wasting and atrophy, not elsewhere classified, right thigh: Secondary | ICD-10-CM | POA: Diagnosis not present

## 2018-07-06 DIAGNOSIS — R269 Unspecified abnormalities of gait and mobility: Secondary | ICD-10-CM | POA: Diagnosis not present

## 2018-07-06 DIAGNOSIS — M6281 Muscle weakness (generalized): Secondary | ICD-10-CM | POA: Diagnosis not present

## 2018-07-06 DIAGNOSIS — M25671 Stiffness of right ankle, not elsewhere classified: Secondary | ICD-10-CM | POA: Diagnosis not present

## 2018-07-06 DIAGNOSIS — M25674 Stiffness of right foot, not elsewhere classified: Secondary | ICD-10-CM | POA: Diagnosis not present

## 2018-07-06 DIAGNOSIS — M25672 Stiffness of left ankle, not elsewhere classified: Secondary | ICD-10-CM | POA: Diagnosis not present

## 2018-07-06 DIAGNOSIS — M25675 Stiffness of left foot, not elsewhere classified: Secondary | ICD-10-CM | POA: Diagnosis not present

## 2018-07-06 DIAGNOSIS — M62552 Muscle wasting and atrophy, not elsewhere classified, left thigh: Secondary | ICD-10-CM | POA: Diagnosis not present

## 2018-07-07 DIAGNOSIS — M25671 Stiffness of right ankle, not elsewhere classified: Secondary | ICD-10-CM | POA: Diagnosis not present

## 2018-07-07 DIAGNOSIS — M25674 Stiffness of right foot, not elsewhere classified: Secondary | ICD-10-CM | POA: Diagnosis not present

## 2018-07-07 DIAGNOSIS — M62552 Muscle wasting and atrophy, not elsewhere classified, left thigh: Secondary | ICD-10-CM | POA: Diagnosis not present

## 2018-07-07 DIAGNOSIS — R262 Difficulty in walking, not elsewhere classified: Secondary | ICD-10-CM | POA: Diagnosis not present

## 2018-07-07 DIAGNOSIS — M62551 Muscle wasting and atrophy, not elsewhere classified, right thigh: Secondary | ICD-10-CM | POA: Diagnosis not present

## 2018-07-07 DIAGNOSIS — M6281 Muscle weakness (generalized): Secondary | ICD-10-CM | POA: Diagnosis not present

## 2018-07-07 DIAGNOSIS — M25672 Stiffness of left ankle, not elsewhere classified: Secondary | ICD-10-CM | POA: Diagnosis not present

## 2018-07-07 DIAGNOSIS — M25675 Stiffness of left foot, not elsewhere classified: Secondary | ICD-10-CM | POA: Diagnosis not present

## 2018-07-07 DIAGNOSIS — R269 Unspecified abnormalities of gait and mobility: Secondary | ICD-10-CM | POA: Diagnosis not present

## 2018-07-09 DIAGNOSIS — M25672 Stiffness of left ankle, not elsewhere classified: Secondary | ICD-10-CM | POA: Diagnosis not present

## 2018-07-09 DIAGNOSIS — M62552 Muscle wasting and atrophy, not elsewhere classified, left thigh: Secondary | ICD-10-CM | POA: Diagnosis not present

## 2018-07-09 DIAGNOSIS — M6281 Muscle weakness (generalized): Secondary | ICD-10-CM | POA: Diagnosis not present

## 2018-07-09 DIAGNOSIS — M25671 Stiffness of right ankle, not elsewhere classified: Secondary | ICD-10-CM | POA: Diagnosis not present

## 2018-07-09 DIAGNOSIS — R262 Difficulty in walking, not elsewhere classified: Secondary | ICD-10-CM | POA: Diagnosis not present

## 2018-07-09 DIAGNOSIS — M62551 Muscle wasting and atrophy, not elsewhere classified, right thigh: Secondary | ICD-10-CM | POA: Diagnosis not present

## 2018-07-09 DIAGNOSIS — M25674 Stiffness of right foot, not elsewhere classified: Secondary | ICD-10-CM | POA: Diagnosis not present

## 2018-07-09 DIAGNOSIS — M25675 Stiffness of left foot, not elsewhere classified: Secondary | ICD-10-CM | POA: Diagnosis not present

## 2018-07-09 DIAGNOSIS — R269 Unspecified abnormalities of gait and mobility: Secondary | ICD-10-CM | POA: Diagnosis not present

## 2018-07-13 DIAGNOSIS — R262 Difficulty in walking, not elsewhere classified: Secondary | ICD-10-CM | POA: Diagnosis not present

## 2018-07-13 DIAGNOSIS — M25674 Stiffness of right foot, not elsewhere classified: Secondary | ICD-10-CM | POA: Diagnosis not present

## 2018-07-13 DIAGNOSIS — M62551 Muscle wasting and atrophy, not elsewhere classified, right thigh: Secondary | ICD-10-CM | POA: Diagnosis not present

## 2018-07-13 DIAGNOSIS — M6281 Muscle weakness (generalized): Secondary | ICD-10-CM | POA: Diagnosis not present

## 2018-07-13 DIAGNOSIS — M25671 Stiffness of right ankle, not elsewhere classified: Secondary | ICD-10-CM | POA: Diagnosis not present

## 2018-07-13 DIAGNOSIS — M62552 Muscle wasting and atrophy, not elsewhere classified, left thigh: Secondary | ICD-10-CM | POA: Diagnosis not present

## 2018-07-13 DIAGNOSIS — M25672 Stiffness of left ankle, not elsewhere classified: Secondary | ICD-10-CM | POA: Diagnosis not present

## 2018-07-13 DIAGNOSIS — R269 Unspecified abnormalities of gait and mobility: Secondary | ICD-10-CM | POA: Diagnosis not present

## 2018-07-13 DIAGNOSIS — M25675 Stiffness of left foot, not elsewhere classified: Secondary | ICD-10-CM | POA: Diagnosis not present

## 2018-07-14 DIAGNOSIS — M25672 Stiffness of left ankle, not elsewhere classified: Secondary | ICD-10-CM | POA: Diagnosis not present

## 2018-07-14 DIAGNOSIS — M62552 Muscle wasting and atrophy, not elsewhere classified, left thigh: Secondary | ICD-10-CM | POA: Diagnosis not present

## 2018-07-14 DIAGNOSIS — R269 Unspecified abnormalities of gait and mobility: Secondary | ICD-10-CM | POA: Diagnosis not present

## 2018-07-14 DIAGNOSIS — R262 Difficulty in walking, not elsewhere classified: Secondary | ICD-10-CM | POA: Diagnosis not present

## 2018-07-14 DIAGNOSIS — M6281 Muscle weakness (generalized): Secondary | ICD-10-CM | POA: Diagnosis not present

## 2018-07-14 DIAGNOSIS — M25675 Stiffness of left foot, not elsewhere classified: Secondary | ICD-10-CM | POA: Diagnosis not present

## 2018-07-14 DIAGNOSIS — M25674 Stiffness of right foot, not elsewhere classified: Secondary | ICD-10-CM | POA: Diagnosis not present

## 2018-07-14 DIAGNOSIS — M25671 Stiffness of right ankle, not elsewhere classified: Secondary | ICD-10-CM | POA: Diagnosis not present

## 2018-07-14 DIAGNOSIS — M62551 Muscle wasting and atrophy, not elsewhere classified, right thigh: Secondary | ICD-10-CM | POA: Diagnosis not present

## 2018-07-16 DIAGNOSIS — M6281 Muscle weakness (generalized): Secondary | ICD-10-CM | POA: Diagnosis not present

## 2018-07-16 DIAGNOSIS — M25671 Stiffness of right ankle, not elsewhere classified: Secondary | ICD-10-CM | POA: Diagnosis not present

## 2018-07-16 DIAGNOSIS — M25674 Stiffness of right foot, not elsewhere classified: Secondary | ICD-10-CM | POA: Diagnosis not present

## 2018-07-16 DIAGNOSIS — R269 Unspecified abnormalities of gait and mobility: Secondary | ICD-10-CM | POA: Diagnosis not present

## 2018-07-16 DIAGNOSIS — M62551 Muscle wasting and atrophy, not elsewhere classified, right thigh: Secondary | ICD-10-CM | POA: Diagnosis not present

## 2018-07-16 DIAGNOSIS — R262 Difficulty in walking, not elsewhere classified: Secondary | ICD-10-CM | POA: Diagnosis not present

## 2018-07-16 DIAGNOSIS — M25672 Stiffness of left ankle, not elsewhere classified: Secondary | ICD-10-CM | POA: Diagnosis not present

## 2018-07-16 DIAGNOSIS — M62552 Muscle wasting and atrophy, not elsewhere classified, left thigh: Secondary | ICD-10-CM | POA: Diagnosis not present

## 2018-07-16 DIAGNOSIS — M25675 Stiffness of left foot, not elsewhere classified: Secondary | ICD-10-CM | POA: Diagnosis not present

## 2018-07-26 DIAGNOSIS — M62551 Muscle wasting and atrophy, not elsewhere classified, right thigh: Secondary | ICD-10-CM | POA: Diagnosis not present

## 2018-07-26 DIAGNOSIS — M25674 Stiffness of right foot, not elsewhere classified: Secondary | ICD-10-CM | POA: Diagnosis not present

## 2018-07-26 DIAGNOSIS — M25671 Stiffness of right ankle, not elsewhere classified: Secondary | ICD-10-CM | POA: Diagnosis not present

## 2018-07-26 DIAGNOSIS — M25672 Stiffness of left ankle, not elsewhere classified: Secondary | ICD-10-CM | POA: Diagnosis not present

## 2018-07-26 DIAGNOSIS — R269 Unspecified abnormalities of gait and mobility: Secondary | ICD-10-CM | POA: Diagnosis not present

## 2018-07-26 DIAGNOSIS — M25675 Stiffness of left foot, not elsewhere classified: Secondary | ICD-10-CM | POA: Diagnosis not present

## 2018-07-26 DIAGNOSIS — R262 Difficulty in walking, not elsewhere classified: Secondary | ICD-10-CM | POA: Diagnosis not present

## 2018-07-26 DIAGNOSIS — M62552 Muscle wasting and atrophy, not elsewhere classified, left thigh: Secondary | ICD-10-CM | POA: Diagnosis not present

## 2018-07-26 DIAGNOSIS — M6281 Muscle weakness (generalized): Secondary | ICD-10-CM | POA: Diagnosis not present

## 2018-07-27 DIAGNOSIS — M25671 Stiffness of right ankle, not elsewhere classified: Secondary | ICD-10-CM | POA: Diagnosis not present

## 2018-07-27 DIAGNOSIS — M25674 Stiffness of right foot, not elsewhere classified: Secondary | ICD-10-CM | POA: Diagnosis not present

## 2018-07-27 DIAGNOSIS — M6281 Muscle weakness (generalized): Secondary | ICD-10-CM | POA: Diagnosis not present

## 2018-07-27 DIAGNOSIS — M62551 Muscle wasting and atrophy, not elsewhere classified, right thigh: Secondary | ICD-10-CM | POA: Diagnosis not present

## 2018-07-27 DIAGNOSIS — M62552 Muscle wasting and atrophy, not elsewhere classified, left thigh: Secondary | ICD-10-CM | POA: Diagnosis not present

## 2018-07-27 DIAGNOSIS — M25672 Stiffness of left ankle, not elsewhere classified: Secondary | ICD-10-CM | POA: Diagnosis not present

## 2018-07-27 DIAGNOSIS — M25675 Stiffness of left foot, not elsewhere classified: Secondary | ICD-10-CM | POA: Diagnosis not present

## 2018-07-27 DIAGNOSIS — R269 Unspecified abnormalities of gait and mobility: Secondary | ICD-10-CM | POA: Diagnosis not present

## 2018-07-27 DIAGNOSIS — R262 Difficulty in walking, not elsewhere classified: Secondary | ICD-10-CM | POA: Diagnosis not present

## 2018-07-30 DIAGNOSIS — M25671 Stiffness of right ankle, not elsewhere classified: Secondary | ICD-10-CM | POA: Diagnosis not present

## 2018-07-30 DIAGNOSIS — M62552 Muscle wasting and atrophy, not elsewhere classified, left thigh: Secondary | ICD-10-CM | POA: Diagnosis not present

## 2018-07-30 DIAGNOSIS — R269 Unspecified abnormalities of gait and mobility: Secondary | ICD-10-CM | POA: Diagnosis not present

## 2018-07-30 DIAGNOSIS — M25674 Stiffness of right foot, not elsewhere classified: Secondary | ICD-10-CM | POA: Diagnosis not present

## 2018-07-30 DIAGNOSIS — M25672 Stiffness of left ankle, not elsewhere classified: Secondary | ICD-10-CM | POA: Diagnosis not present

## 2018-07-30 DIAGNOSIS — R262 Difficulty in walking, not elsewhere classified: Secondary | ICD-10-CM | POA: Diagnosis not present

## 2018-07-30 DIAGNOSIS — M6281 Muscle weakness (generalized): Secondary | ICD-10-CM | POA: Diagnosis not present

## 2018-07-30 DIAGNOSIS — M62551 Muscle wasting and atrophy, not elsewhere classified, right thigh: Secondary | ICD-10-CM | POA: Diagnosis not present

## 2018-07-30 DIAGNOSIS — M25675 Stiffness of left foot, not elsewhere classified: Secondary | ICD-10-CM | POA: Diagnosis not present

## 2018-08-04 DIAGNOSIS — M62552 Muscle wasting and atrophy, not elsewhere classified, left thigh: Secondary | ICD-10-CM | POA: Diagnosis not present

## 2018-08-04 DIAGNOSIS — M25672 Stiffness of left ankle, not elsewhere classified: Secondary | ICD-10-CM | POA: Diagnosis not present

## 2018-08-04 DIAGNOSIS — R269 Unspecified abnormalities of gait and mobility: Secondary | ICD-10-CM | POA: Diagnosis not present

## 2018-08-04 DIAGNOSIS — J069 Acute upper respiratory infection, unspecified: Secondary | ICD-10-CM | POA: Diagnosis not present

## 2018-08-04 DIAGNOSIS — M25674 Stiffness of right foot, not elsewhere classified: Secondary | ICD-10-CM | POA: Diagnosis not present

## 2018-08-04 DIAGNOSIS — M25675 Stiffness of left foot, not elsewhere classified: Secondary | ICD-10-CM | POA: Diagnosis not present

## 2018-08-04 DIAGNOSIS — R262 Difficulty in walking, not elsewhere classified: Secondary | ICD-10-CM | POA: Diagnosis not present

## 2018-08-04 DIAGNOSIS — M6281 Muscle weakness (generalized): Secondary | ICD-10-CM | POA: Diagnosis not present

## 2018-08-04 DIAGNOSIS — M25671 Stiffness of right ankle, not elsewhere classified: Secondary | ICD-10-CM | POA: Diagnosis not present

## 2018-08-04 DIAGNOSIS — M62551 Muscle wasting and atrophy, not elsewhere classified, right thigh: Secondary | ICD-10-CM | POA: Diagnosis not present

## 2018-08-06 DIAGNOSIS — M25671 Stiffness of right ankle, not elsewhere classified: Secondary | ICD-10-CM | POA: Diagnosis not present

## 2018-08-06 DIAGNOSIS — M25672 Stiffness of left ankle, not elsewhere classified: Secondary | ICD-10-CM | POA: Diagnosis not present

## 2018-08-06 DIAGNOSIS — M25675 Stiffness of left foot, not elsewhere classified: Secondary | ICD-10-CM | POA: Diagnosis not present

## 2018-08-06 DIAGNOSIS — R262 Difficulty in walking, not elsewhere classified: Secondary | ICD-10-CM | POA: Diagnosis not present

## 2018-08-06 DIAGNOSIS — M6281 Muscle weakness (generalized): Secondary | ICD-10-CM | POA: Diagnosis not present

## 2018-08-06 DIAGNOSIS — M25674 Stiffness of right foot, not elsewhere classified: Secondary | ICD-10-CM | POA: Diagnosis not present

## 2018-08-06 DIAGNOSIS — R269 Unspecified abnormalities of gait and mobility: Secondary | ICD-10-CM | POA: Diagnosis not present

## 2018-08-06 DIAGNOSIS — M62551 Muscle wasting and atrophy, not elsewhere classified, right thigh: Secondary | ICD-10-CM | POA: Diagnosis not present

## 2018-08-06 DIAGNOSIS — M62552 Muscle wasting and atrophy, not elsewhere classified, left thigh: Secondary | ICD-10-CM | POA: Diagnosis not present

## 2018-08-11 DIAGNOSIS — R262 Difficulty in walking, not elsewhere classified: Secondary | ICD-10-CM | POA: Diagnosis not present

## 2018-08-11 DIAGNOSIS — M25674 Stiffness of right foot, not elsewhere classified: Secondary | ICD-10-CM | POA: Diagnosis not present

## 2018-08-11 DIAGNOSIS — M62552 Muscle wasting and atrophy, not elsewhere classified, left thigh: Secondary | ICD-10-CM | POA: Diagnosis not present

## 2018-08-11 DIAGNOSIS — M25672 Stiffness of left ankle, not elsewhere classified: Secondary | ICD-10-CM | POA: Diagnosis not present

## 2018-08-11 DIAGNOSIS — M25671 Stiffness of right ankle, not elsewhere classified: Secondary | ICD-10-CM | POA: Diagnosis not present

## 2018-08-11 DIAGNOSIS — M25675 Stiffness of left foot, not elsewhere classified: Secondary | ICD-10-CM | POA: Diagnosis not present

## 2018-08-11 DIAGNOSIS — M6281 Muscle weakness (generalized): Secondary | ICD-10-CM | POA: Diagnosis not present

## 2018-08-11 DIAGNOSIS — R269 Unspecified abnormalities of gait and mobility: Secondary | ICD-10-CM | POA: Diagnosis not present

## 2018-08-11 DIAGNOSIS — M62551 Muscle wasting and atrophy, not elsewhere classified, right thigh: Secondary | ICD-10-CM | POA: Diagnosis not present

## 2018-08-13 DIAGNOSIS — M25672 Stiffness of left ankle, not elsewhere classified: Secondary | ICD-10-CM | POA: Diagnosis not present

## 2018-08-13 DIAGNOSIS — M25671 Stiffness of right ankle, not elsewhere classified: Secondary | ICD-10-CM | POA: Diagnosis not present

## 2018-08-13 DIAGNOSIS — M25675 Stiffness of left foot, not elsewhere classified: Secondary | ICD-10-CM | POA: Diagnosis not present

## 2018-08-13 DIAGNOSIS — M25674 Stiffness of right foot, not elsewhere classified: Secondary | ICD-10-CM | POA: Diagnosis not present

## 2018-08-16 DIAGNOSIS — M62552 Muscle wasting and atrophy, not elsewhere classified, left thigh: Secondary | ICD-10-CM | POA: Diagnosis not present

## 2018-08-16 DIAGNOSIS — M25675 Stiffness of left foot, not elsewhere classified: Secondary | ICD-10-CM | POA: Diagnosis not present

## 2018-08-16 DIAGNOSIS — M62551 Muscle wasting and atrophy, not elsewhere classified, right thigh: Secondary | ICD-10-CM | POA: Diagnosis not present

## 2018-08-16 DIAGNOSIS — M25674 Stiffness of right foot, not elsewhere classified: Secondary | ICD-10-CM | POA: Diagnosis not present

## 2018-08-16 DIAGNOSIS — R262 Difficulty in walking, not elsewhere classified: Secondary | ICD-10-CM | POA: Diagnosis not present

## 2018-08-16 DIAGNOSIS — R269 Unspecified abnormalities of gait and mobility: Secondary | ICD-10-CM | POA: Diagnosis not present

## 2018-08-16 DIAGNOSIS — M25672 Stiffness of left ankle, not elsewhere classified: Secondary | ICD-10-CM | POA: Diagnosis not present

## 2018-08-16 DIAGNOSIS — M25671 Stiffness of right ankle, not elsewhere classified: Secondary | ICD-10-CM | POA: Diagnosis not present

## 2018-08-16 DIAGNOSIS — M6281 Muscle weakness (generalized): Secondary | ICD-10-CM | POA: Diagnosis not present

## 2018-08-17 ENCOUNTER — Ambulatory Visit (INDEPENDENT_AMBULATORY_CARE_PROVIDER_SITE_OTHER): Payer: PPO | Admitting: Podiatry

## 2018-08-17 ENCOUNTER — Other Ambulatory Visit: Payer: Self-pay

## 2018-08-17 DIAGNOSIS — M2042 Other hammer toe(s) (acquired), left foot: Secondary | ICD-10-CM | POA: Diagnosis not present

## 2018-08-17 DIAGNOSIS — M2012 Hallux valgus (acquired), left foot: Secondary | ICD-10-CM

## 2018-08-17 DIAGNOSIS — M6281 Muscle weakness (generalized): Secondary | ICD-10-CM | POA: Diagnosis not present

## 2018-08-17 DIAGNOSIS — M21619 Bunion of unspecified foot: Secondary | ICD-10-CM | POA: Diagnosis not present

## 2018-08-17 DIAGNOSIS — R531 Weakness: Secondary | ICD-10-CM

## 2018-08-17 DIAGNOSIS — R269 Unspecified abnormalities of gait and mobility: Secondary | ICD-10-CM | POA: Diagnosis not present

## 2018-08-17 DIAGNOSIS — M2041 Other hammer toe(s) (acquired), right foot: Secondary | ICD-10-CM

## 2018-08-17 NOTE — Progress Notes (Signed)
Subjective:  Patient ID: Sandra Lynch, female    DOB: 11-17-1934,  MRN: [id number]  Chief Complaint  Patient presents with  . Routine Post Op    Post op visit and balance issues PT. states," no pain but R knee gets very sore and not walking as good as I was." Tx: PT -pt deneis N/V/F?[place]   DOS: 02/11/18 Procedure: Right first metatarsophalangeal joint fusion, lesser metatarsal head resection, hammertoe repair 54 and 7  84 y.o. female returns for f/u.   Review of Systems: Negative except as noted in the HPI. Denies N/V/F/Ch.  Past Medical History:  Diagnosis Date  . Allergic rhinitis   . Anxiety   . Arthritis    "scattered in my joints" (02/11/2018)  . Atrial fibrillation ([place])   . Cough   . Essential hypertension   . GERD (gastroesophageal reflux disease)   . Hyperlipidemia   . Hyponatremia   . Pneumonia ~ 11/2017   walking pneumonia  . Pneumonia    "as a child"  . Urge incontinence     Current Outpatient Medications:  .  alendronate (FOSAMAX) 70 MG tablet, PLEASE SEE ATTACHED FOR DETAILED DIRECTIONS, Disp: , Rfl: 3 .  ALPRAZolam (XANAX) 0.25 MG tablet, Take 1-2 tablets (0.25-0.5 mg total) by mouth 2 (two) times daily as needed for anxiety., Disp: 2 tablet, Rfl: 0 .  amLODipine (NORVASC) 5 MG tablet, Take 5 mg by mouth daily., Disp: , Rfl: 3 .  aspirin EC 81 MG tablet, Take 1 tablet by mouth daily., Disp: , Rfl:  .  Calcium Carbonate-Vitamin D (CALTRATE 600+D PO), Take 1 tablet by mouth daily., Disp: , Rfl:  .  enoxaparin (LOVENOX) 30 MG/0.3ML injection, Inject 0.3 mLs (30 mg total) into the skin daily., Disp: 14 Syringe, Rfl: 0 .  fluticasone (FLONASE) 50 MCG/ACT nasal spray, Place 1 spray into both nostrils daily., Disp: , Rfl: 3 .  gabapentin (NEURONTIN) 300 MG capsule, Take 1 capsule (300 mg total) by mouth at bedtime., Disp: 30 capsule, Rfl: 0 .  Multiple Vitamins-Minerals (CENTRUM SILVER 50+WOMEN PO), Take 1 tablet by mouth daily. , Disp: , Rfl:  .  MYRBETRIQ 25 MG  TB24 tablet, Take 25 mg by mouth daily., Disp: , Rfl: 4 .  Omega-3 Fatty Acids (FISH OIL) 1000 MG CAPS, Take 1 tablet by mouth daily., Disp: , Rfl:  .  omeprazole (PRILOSEC) 20 MG capsule, Take 20 mg by mouth daily as needed (heartburn). , Disp: , Rfl:  .  oxyCODONE-acetaminophen (PERCOCET) 10-325 MG tablet, Take 1 tablet by mouth every 4 (four) hours as needed for pain., Disp: 20 tablet, Rfl: 0 .  pravastatin (PRAVACHOL) 40 MG tablet, Take 40 mg by mouth at bedtime., Disp: , Rfl: 3 .  sodium chloride (OCEAN) 0.65 % SOLN nasal spray, Place 1 spray into both nostrils as needed for congestion., Disp: , Rfl:  .  traMADol (ULTRAM) 50 MG tablet, Take 1 tablet (50 mg total) by mouth every 6 (six) hours as needed. for pain, Disp: 30 tablet, Rfl: 0 .  valsartan (DIOVAN) 40 MG tablet, Take 40 mg by mouth daily. for blood pressure, Disp: , Rfl: 0  Social History   Tobacco Use  Smoking Status Never Smoker  Smokeless Tobacco Never Used    Allergies  Allergen Reactions  . Lisinopril Cough   Objective:   There were no vitals filed for this visit. There is no height or weight on file to calculate BMI. Constitutional Well developed. Well nourished.  Vascular Foot  warm and well perfused. Capillary refill normal to all digits.   Neurologic Normal speech. Oriented to person, place, and time. Epicritic sensation to light touch grossly present bilaterally.  Dermatologic Skin well healed. Punctate keratosis sub-met for left foot  Orthopedic: No tenderness to palpation noted about the surgical site. 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 Hammer toe of right foot   2. Hallux valgus, left   3. Hammertoe of left foot   4. Muscle weakness   5. Generalized weakness   6. Bunion   7. Gait disturbance    Plan:  Patient was evaluated and treated and all questions  answered.  S/p foot surgery right -Well healed surgical foot. -Ambulating without pain, albeit slowly. Some digital deformities noted but no recurrence of callus formation right foot. -She walks without pain but very slowly with the assistance of her walker. With her braces she was walking faster but I do think there is a confidence/fear that is holding her back.  Balance assessment with gait disturbance, risk for falls -Balance braces dispensed today and educated on use.  HAV deformity left, hammertoe deformity, metatarsalgia, fat pad atrophy -Patient would like to hold off surgery not strong enough at this time to consider.   No follow-ups on file.

## 2018-08-18 DIAGNOSIS — M25674 Stiffness of right foot, not elsewhere classified: Secondary | ICD-10-CM | POA: Diagnosis not present

## 2018-08-18 DIAGNOSIS — M62552 Muscle wasting and atrophy, not elsewhere classified, left thigh: Secondary | ICD-10-CM | POA: Diagnosis not present

## 2018-08-18 DIAGNOSIS — R262 Difficulty in walking, not elsewhere classified: Secondary | ICD-10-CM | POA: Diagnosis not present

## 2018-08-18 DIAGNOSIS — M25672 Stiffness of left ankle, not elsewhere classified: Secondary | ICD-10-CM | POA: Diagnosis not present

## 2018-08-18 DIAGNOSIS — M62551 Muscle wasting and atrophy, not elsewhere classified, right thigh: Secondary | ICD-10-CM | POA: Diagnosis not present

## 2018-08-18 DIAGNOSIS — R269 Unspecified abnormalities of gait and mobility: Secondary | ICD-10-CM | POA: Diagnosis not present

## 2018-08-18 DIAGNOSIS — M25675 Stiffness of left foot, not elsewhere classified: Secondary | ICD-10-CM | POA: Diagnosis not present

## 2018-08-18 DIAGNOSIS — M25671 Stiffness of right ankle, not elsewhere classified: Secondary | ICD-10-CM | POA: Diagnosis not present

## 2018-08-18 DIAGNOSIS — M6281 Muscle weakness (generalized): Secondary | ICD-10-CM | POA: Diagnosis not present

## 2018-08-23 DIAGNOSIS — M25672 Stiffness of left ankle, not elsewhere classified: Secondary | ICD-10-CM | POA: Diagnosis not present

## 2018-08-23 DIAGNOSIS — M25675 Stiffness of left foot, not elsewhere classified: Secondary | ICD-10-CM | POA: Diagnosis not present

## 2018-08-23 DIAGNOSIS — M25674 Stiffness of right foot, not elsewhere classified: Secondary | ICD-10-CM | POA: Diagnosis not present

## 2018-08-23 DIAGNOSIS — M62552 Muscle wasting and atrophy, not elsewhere classified, left thigh: Secondary | ICD-10-CM | POA: Diagnosis not present

## 2018-08-23 DIAGNOSIS — R269 Unspecified abnormalities of gait and mobility: Secondary | ICD-10-CM | POA: Diagnosis not present

## 2018-08-23 DIAGNOSIS — M62551 Muscle wasting and atrophy, not elsewhere classified, right thigh: Secondary | ICD-10-CM | POA: Diagnosis not present

## 2018-08-23 DIAGNOSIS — R262 Difficulty in walking, not elsewhere classified: Secondary | ICD-10-CM | POA: Diagnosis not present

## 2018-08-23 DIAGNOSIS — M6281 Muscle weakness (generalized): Secondary | ICD-10-CM | POA: Diagnosis not present

## 2018-08-23 DIAGNOSIS — M25671 Stiffness of right ankle, not elsewhere classified: Secondary | ICD-10-CM | POA: Diagnosis not present

## 2018-08-24 DIAGNOSIS — K219 Gastro-esophageal reflux disease without esophagitis: Secondary | ICD-10-CM | POA: Diagnosis not present

## 2018-08-24 DIAGNOSIS — I4811 Longstanding persistent atrial fibrillation: Secondary | ICD-10-CM | POA: Diagnosis not present

## 2018-08-24 DIAGNOSIS — I10 Essential (primary) hypertension: Secondary | ICD-10-CM | POA: Diagnosis not present

## 2018-08-24 DIAGNOSIS — M1711 Unilateral primary osteoarthritis, right knee: Secondary | ICD-10-CM | POA: Diagnosis not present

## 2018-08-24 DIAGNOSIS — E782 Mixed hyperlipidemia: Secondary | ICD-10-CM | POA: Diagnosis not present

## 2018-08-24 DIAGNOSIS — Z6824 Body mass index (BMI) 24.0-24.9, adult: Secondary | ICD-10-CM | POA: Diagnosis not present

## 2018-08-24 DIAGNOSIS — M810 Age-related osteoporosis without current pathological fracture: Secondary | ICD-10-CM | POA: Diagnosis not present

## 2018-08-25 DIAGNOSIS — M25675 Stiffness of left foot, not elsewhere classified: Secondary | ICD-10-CM | POA: Diagnosis not present

## 2018-08-25 DIAGNOSIS — M62551 Muscle wasting and atrophy, not elsewhere classified, right thigh: Secondary | ICD-10-CM | POA: Diagnosis not present

## 2018-08-25 DIAGNOSIS — M25671 Stiffness of right ankle, not elsewhere classified: Secondary | ICD-10-CM | POA: Diagnosis not present

## 2018-08-25 DIAGNOSIS — M6281 Muscle weakness (generalized): Secondary | ICD-10-CM | POA: Diagnosis not present

## 2018-08-25 DIAGNOSIS — M25672 Stiffness of left ankle, not elsewhere classified: Secondary | ICD-10-CM | POA: Diagnosis not present

## 2018-08-25 DIAGNOSIS — M25674 Stiffness of right foot, not elsewhere classified: Secondary | ICD-10-CM | POA: Diagnosis not present

## 2018-08-25 DIAGNOSIS — R269 Unspecified abnormalities of gait and mobility: Secondary | ICD-10-CM | POA: Diagnosis not present

## 2018-08-25 DIAGNOSIS — M62552 Muscle wasting and atrophy, not elsewhere classified, left thigh: Secondary | ICD-10-CM | POA: Diagnosis not present

## 2018-08-25 DIAGNOSIS — R262 Difficulty in walking, not elsewhere classified: Secondary | ICD-10-CM | POA: Diagnosis not present

## 2018-08-30 DIAGNOSIS — R262 Difficulty in walking, not elsewhere classified: Secondary | ICD-10-CM | POA: Diagnosis not present

## 2018-08-30 DIAGNOSIS — M25675 Stiffness of left foot, not elsewhere classified: Secondary | ICD-10-CM | POA: Diagnosis not present

## 2018-08-30 DIAGNOSIS — M6281 Muscle weakness (generalized): Secondary | ICD-10-CM | POA: Diagnosis not present

## 2018-08-30 DIAGNOSIS — R269 Unspecified abnormalities of gait and mobility: Secondary | ICD-10-CM | POA: Diagnosis not present

## 2018-08-30 DIAGNOSIS — M62551 Muscle wasting and atrophy, not elsewhere classified, right thigh: Secondary | ICD-10-CM | POA: Diagnosis not present

## 2018-08-30 DIAGNOSIS — M25674 Stiffness of right foot, not elsewhere classified: Secondary | ICD-10-CM | POA: Diagnosis not present

## 2018-08-30 DIAGNOSIS — M25672 Stiffness of left ankle, not elsewhere classified: Secondary | ICD-10-CM | POA: Diagnosis not present

## 2018-08-30 DIAGNOSIS — M62552 Muscle wasting and atrophy, not elsewhere classified, left thigh: Secondary | ICD-10-CM | POA: Diagnosis not present

## 2018-08-30 DIAGNOSIS — M25671 Stiffness of right ankle, not elsewhere classified: Secondary | ICD-10-CM | POA: Diagnosis not present

## 2018-09-01 DIAGNOSIS — M25672 Stiffness of left ankle, not elsewhere classified: Secondary | ICD-10-CM | POA: Diagnosis not present

## 2018-09-01 DIAGNOSIS — M25671 Stiffness of right ankle, not elsewhere classified: Secondary | ICD-10-CM | POA: Diagnosis not present

## 2018-09-01 DIAGNOSIS — M62552 Muscle wasting and atrophy, not elsewhere classified, left thigh: Secondary | ICD-10-CM | POA: Diagnosis not present

## 2018-09-01 DIAGNOSIS — R269 Unspecified abnormalities of gait and mobility: Secondary | ICD-10-CM | POA: Diagnosis not present

## 2018-09-01 DIAGNOSIS — M62551 Muscle wasting and atrophy, not elsewhere classified, right thigh: Secondary | ICD-10-CM | POA: Diagnosis not present

## 2018-09-01 DIAGNOSIS — M25674 Stiffness of right foot, not elsewhere classified: Secondary | ICD-10-CM | POA: Diagnosis not present

## 2018-09-01 DIAGNOSIS — M25675 Stiffness of left foot, not elsewhere classified: Secondary | ICD-10-CM | POA: Diagnosis not present

## 2018-09-01 DIAGNOSIS — R262 Difficulty in walking, not elsewhere classified: Secondary | ICD-10-CM | POA: Diagnosis not present

## 2018-09-01 DIAGNOSIS — M6281 Muscle weakness (generalized): Secondary | ICD-10-CM | POA: Diagnosis not present

## 2018-09-03 DIAGNOSIS — R262 Difficulty in walking, not elsewhere classified: Secondary | ICD-10-CM | POA: Diagnosis not present

## 2018-09-03 DIAGNOSIS — M25672 Stiffness of left ankle, not elsewhere classified: Secondary | ICD-10-CM | POA: Diagnosis not present

## 2018-09-03 DIAGNOSIS — M6281 Muscle weakness (generalized): Secondary | ICD-10-CM | POA: Diagnosis not present

## 2018-09-03 DIAGNOSIS — M62552 Muscle wasting and atrophy, not elsewhere classified, left thigh: Secondary | ICD-10-CM | POA: Diagnosis not present

## 2018-09-03 DIAGNOSIS — M25675 Stiffness of left foot, not elsewhere classified: Secondary | ICD-10-CM | POA: Diagnosis not present

## 2018-09-03 DIAGNOSIS — M25674 Stiffness of right foot, not elsewhere classified: Secondary | ICD-10-CM | POA: Diagnosis not present

## 2018-09-03 DIAGNOSIS — M25671 Stiffness of right ankle, not elsewhere classified: Secondary | ICD-10-CM | POA: Diagnosis not present

## 2018-09-03 DIAGNOSIS — M62551 Muscle wasting and atrophy, not elsewhere classified, right thigh: Secondary | ICD-10-CM | POA: Diagnosis not present

## 2018-09-03 DIAGNOSIS — R269 Unspecified abnormalities of gait and mobility: Secondary | ICD-10-CM | POA: Diagnosis not present

## 2018-09-07 DIAGNOSIS — M62551 Muscle wasting and atrophy, not elsewhere classified, right thigh: Secondary | ICD-10-CM | POA: Diagnosis not present

## 2018-09-07 DIAGNOSIS — M25672 Stiffness of left ankle, not elsewhere classified: Secondary | ICD-10-CM | POA: Diagnosis not present

## 2018-09-07 DIAGNOSIS — M25671 Stiffness of right ankle, not elsewhere classified: Secondary | ICD-10-CM | POA: Diagnosis not present

## 2018-09-07 DIAGNOSIS — M25674 Stiffness of right foot, not elsewhere classified: Secondary | ICD-10-CM | POA: Diagnosis not present

## 2018-09-07 DIAGNOSIS — M6281 Muscle weakness (generalized): Secondary | ICD-10-CM | POA: Diagnosis not present

## 2018-09-07 DIAGNOSIS — R262 Difficulty in walking, not elsewhere classified: Secondary | ICD-10-CM | POA: Diagnosis not present

## 2018-09-07 DIAGNOSIS — M62552 Muscle wasting and atrophy, not elsewhere classified, left thigh: Secondary | ICD-10-CM | POA: Diagnosis not present

## 2018-09-07 DIAGNOSIS — M25675 Stiffness of left foot, not elsewhere classified: Secondary | ICD-10-CM | POA: Diagnosis not present

## 2018-09-07 DIAGNOSIS — R269 Unspecified abnormalities of gait and mobility: Secondary | ICD-10-CM | POA: Diagnosis not present

## 2018-09-08 DIAGNOSIS — M25674 Stiffness of right foot, not elsewhere classified: Secondary | ICD-10-CM | POA: Diagnosis not present

## 2018-09-08 DIAGNOSIS — R262 Difficulty in walking, not elsewhere classified: Secondary | ICD-10-CM | POA: Diagnosis not present

## 2018-09-08 DIAGNOSIS — M6281 Muscle weakness (generalized): Secondary | ICD-10-CM | POA: Diagnosis not present

## 2018-09-08 DIAGNOSIS — R269 Unspecified abnormalities of gait and mobility: Secondary | ICD-10-CM | POA: Diagnosis not present

## 2018-09-08 DIAGNOSIS — M62552 Muscle wasting and atrophy, not elsewhere classified, left thigh: Secondary | ICD-10-CM | POA: Diagnosis not present

## 2018-09-08 DIAGNOSIS — M25671 Stiffness of right ankle, not elsewhere classified: Secondary | ICD-10-CM | POA: Diagnosis not present

## 2018-09-08 DIAGNOSIS — M25675 Stiffness of left foot, not elsewhere classified: Secondary | ICD-10-CM | POA: Diagnosis not present

## 2018-09-08 DIAGNOSIS — M25672 Stiffness of left ankle, not elsewhere classified: Secondary | ICD-10-CM | POA: Diagnosis not present

## 2018-09-08 DIAGNOSIS — M62551 Muscle wasting and atrophy, not elsewhere classified, right thigh: Secondary | ICD-10-CM | POA: Diagnosis not present

## 2018-09-13 DIAGNOSIS — M25675 Stiffness of left foot, not elsewhere classified: Secondary | ICD-10-CM | POA: Diagnosis not present

## 2018-09-13 DIAGNOSIS — R262 Difficulty in walking, not elsewhere classified: Secondary | ICD-10-CM | POA: Diagnosis not present

## 2018-09-13 DIAGNOSIS — M25672 Stiffness of left ankle, not elsewhere classified: Secondary | ICD-10-CM | POA: Diagnosis not present

## 2018-09-13 DIAGNOSIS — M25674 Stiffness of right foot, not elsewhere classified: Secondary | ICD-10-CM | POA: Diagnosis not present

## 2018-09-13 DIAGNOSIS — M6281 Muscle weakness (generalized): Secondary | ICD-10-CM | POA: Diagnosis not present

## 2018-09-13 DIAGNOSIS — R269 Unspecified abnormalities of gait and mobility: Secondary | ICD-10-CM | POA: Diagnosis not present

## 2018-09-13 DIAGNOSIS — M25671 Stiffness of right ankle, not elsewhere classified: Secondary | ICD-10-CM | POA: Diagnosis not present

## 2018-09-13 DIAGNOSIS — M62551 Muscle wasting and atrophy, not elsewhere classified, right thigh: Secondary | ICD-10-CM | POA: Diagnosis not present

## 2018-09-13 DIAGNOSIS — M62552 Muscle wasting and atrophy, not elsewhere classified, left thigh: Secondary | ICD-10-CM | POA: Diagnosis not present

## 2018-09-15 DIAGNOSIS — M25671 Stiffness of right ankle, not elsewhere classified: Secondary | ICD-10-CM | POA: Diagnosis not present

## 2018-09-15 DIAGNOSIS — M25674 Stiffness of right foot, not elsewhere classified: Secondary | ICD-10-CM | POA: Diagnosis not present

## 2018-09-15 DIAGNOSIS — R262 Difficulty in walking, not elsewhere classified: Secondary | ICD-10-CM | POA: Diagnosis not present

## 2018-09-15 DIAGNOSIS — M25672 Stiffness of left ankle, not elsewhere classified: Secondary | ICD-10-CM | POA: Diagnosis not present

## 2018-09-15 DIAGNOSIS — R269 Unspecified abnormalities of gait and mobility: Secondary | ICD-10-CM | POA: Diagnosis not present

## 2018-09-15 DIAGNOSIS — M25675 Stiffness of left foot, not elsewhere classified: Secondary | ICD-10-CM | POA: Diagnosis not present

## 2018-09-15 DIAGNOSIS — M62551 Muscle wasting and atrophy, not elsewhere classified, right thigh: Secondary | ICD-10-CM | POA: Diagnosis not present

## 2018-09-15 DIAGNOSIS — M62552 Muscle wasting and atrophy, not elsewhere classified, left thigh: Secondary | ICD-10-CM | POA: Diagnosis not present

## 2018-09-15 DIAGNOSIS — M6281 Muscle weakness (generalized): Secondary | ICD-10-CM | POA: Diagnosis not present

## 2018-09-20 DIAGNOSIS — R262 Difficulty in walking, not elsewhere classified: Secondary | ICD-10-CM | POA: Diagnosis not present

## 2018-09-20 DIAGNOSIS — M25675 Stiffness of left foot, not elsewhere classified: Secondary | ICD-10-CM | POA: Diagnosis not present

## 2018-09-20 DIAGNOSIS — M25674 Stiffness of right foot, not elsewhere classified: Secondary | ICD-10-CM | POA: Diagnosis not present

## 2018-09-20 DIAGNOSIS — M25672 Stiffness of left ankle, not elsewhere classified: Secondary | ICD-10-CM | POA: Diagnosis not present

## 2018-09-20 DIAGNOSIS — M6281 Muscle weakness (generalized): Secondary | ICD-10-CM | POA: Diagnosis not present

## 2018-09-20 DIAGNOSIS — M25671 Stiffness of right ankle, not elsewhere classified: Secondary | ICD-10-CM | POA: Diagnosis not present

## 2018-09-20 DIAGNOSIS — M62551 Muscle wasting and atrophy, not elsewhere classified, right thigh: Secondary | ICD-10-CM | POA: Diagnosis not present

## 2018-09-20 DIAGNOSIS — M62552 Muscle wasting and atrophy, not elsewhere classified, left thigh: Secondary | ICD-10-CM | POA: Diagnosis not present

## 2018-09-20 DIAGNOSIS — R269 Unspecified abnormalities of gait and mobility: Secondary | ICD-10-CM | POA: Diagnosis not present

## 2018-09-22 DIAGNOSIS — M62552 Muscle wasting and atrophy, not elsewhere classified, left thigh: Secondary | ICD-10-CM | POA: Diagnosis not present

## 2018-09-22 DIAGNOSIS — M6281 Muscle weakness (generalized): Secondary | ICD-10-CM | POA: Diagnosis not present

## 2018-09-22 DIAGNOSIS — R269 Unspecified abnormalities of gait and mobility: Secondary | ICD-10-CM | POA: Diagnosis not present

## 2018-09-22 DIAGNOSIS — M25675 Stiffness of left foot, not elsewhere classified: Secondary | ICD-10-CM | POA: Diagnosis not present

## 2018-09-22 DIAGNOSIS — M25674 Stiffness of right foot, not elsewhere classified: Secondary | ICD-10-CM | POA: Diagnosis not present

## 2018-09-22 DIAGNOSIS — M25672 Stiffness of left ankle, not elsewhere classified: Secondary | ICD-10-CM | POA: Diagnosis not present

## 2018-09-22 DIAGNOSIS — M25671 Stiffness of right ankle, not elsewhere classified: Secondary | ICD-10-CM | POA: Diagnosis not present

## 2018-09-22 DIAGNOSIS — M62551 Muscle wasting and atrophy, not elsewhere classified, right thigh: Secondary | ICD-10-CM | POA: Diagnosis not present

## 2018-09-22 DIAGNOSIS — R262 Difficulty in walking, not elsewhere classified: Secondary | ICD-10-CM | POA: Diagnosis not present

## 2018-09-24 DIAGNOSIS — M25672 Stiffness of left ankle, not elsewhere classified: Secondary | ICD-10-CM | POA: Diagnosis not present

## 2018-09-24 DIAGNOSIS — M25675 Stiffness of left foot, not elsewhere classified: Secondary | ICD-10-CM | POA: Diagnosis not present

## 2018-09-24 DIAGNOSIS — M25671 Stiffness of right ankle, not elsewhere classified: Secondary | ICD-10-CM | POA: Diagnosis not present

## 2018-09-24 DIAGNOSIS — R269 Unspecified abnormalities of gait and mobility: Secondary | ICD-10-CM | POA: Diagnosis not present

## 2018-09-24 DIAGNOSIS — R262 Difficulty in walking, not elsewhere classified: Secondary | ICD-10-CM | POA: Diagnosis not present

## 2018-09-24 DIAGNOSIS — M62552 Muscle wasting and atrophy, not elsewhere classified, left thigh: Secondary | ICD-10-CM | POA: Diagnosis not present

## 2018-09-24 DIAGNOSIS — M6281 Muscle weakness (generalized): Secondary | ICD-10-CM | POA: Diagnosis not present

## 2018-09-24 DIAGNOSIS — M62551 Muscle wasting and atrophy, not elsewhere classified, right thigh: Secondary | ICD-10-CM | POA: Diagnosis not present

## 2018-09-24 DIAGNOSIS — M25674 Stiffness of right foot, not elsewhere classified: Secondary | ICD-10-CM | POA: Diagnosis not present

## 2018-09-27 DIAGNOSIS — M25672 Stiffness of left ankle, not elsewhere classified: Secondary | ICD-10-CM | POA: Diagnosis not present

## 2018-09-27 DIAGNOSIS — R269 Unspecified abnormalities of gait and mobility: Secondary | ICD-10-CM | POA: Diagnosis not present

## 2018-09-27 DIAGNOSIS — M25671 Stiffness of right ankle, not elsewhere classified: Secondary | ICD-10-CM | POA: Diagnosis not present

## 2018-09-27 DIAGNOSIS — M25675 Stiffness of left foot, not elsewhere classified: Secondary | ICD-10-CM | POA: Diagnosis not present

## 2018-09-27 DIAGNOSIS — M62551 Muscle wasting and atrophy, not elsewhere classified, right thigh: Secondary | ICD-10-CM | POA: Diagnosis not present

## 2018-09-27 DIAGNOSIS — M62552 Muscle wasting and atrophy, not elsewhere classified, left thigh: Secondary | ICD-10-CM | POA: Diagnosis not present

## 2018-09-27 DIAGNOSIS — M25674 Stiffness of right foot, not elsewhere classified: Secondary | ICD-10-CM | POA: Diagnosis not present

## 2018-09-27 DIAGNOSIS — M6281 Muscle weakness (generalized): Secondary | ICD-10-CM | POA: Diagnosis not present

## 2018-09-27 DIAGNOSIS — R262 Difficulty in walking, not elsewhere classified: Secondary | ICD-10-CM | POA: Diagnosis not present

## 2018-09-29 DIAGNOSIS — M62551 Muscle wasting and atrophy, not elsewhere classified, right thigh: Secondary | ICD-10-CM | POA: Diagnosis not present

## 2018-09-29 DIAGNOSIS — M25672 Stiffness of left ankle, not elsewhere classified: Secondary | ICD-10-CM | POA: Diagnosis not present

## 2018-09-29 DIAGNOSIS — R262 Difficulty in walking, not elsewhere classified: Secondary | ICD-10-CM | POA: Diagnosis not present

## 2018-09-29 DIAGNOSIS — M25671 Stiffness of right ankle, not elsewhere classified: Secondary | ICD-10-CM | POA: Diagnosis not present

## 2018-09-29 DIAGNOSIS — M62552 Muscle wasting and atrophy, not elsewhere classified, left thigh: Secondary | ICD-10-CM | POA: Diagnosis not present

## 2018-09-29 DIAGNOSIS — M25675 Stiffness of left foot, not elsewhere classified: Secondary | ICD-10-CM | POA: Diagnosis not present

## 2018-09-29 DIAGNOSIS — M25674 Stiffness of right foot, not elsewhere classified: Secondary | ICD-10-CM | POA: Diagnosis not present

## 2018-09-29 DIAGNOSIS — M6281 Muscle weakness (generalized): Secondary | ICD-10-CM | POA: Diagnosis not present

## 2018-09-29 DIAGNOSIS — R269 Unspecified abnormalities of gait and mobility: Secondary | ICD-10-CM | POA: Diagnosis not present

## 2018-10-01 DIAGNOSIS — M62552 Muscle wasting and atrophy, not elsewhere classified, left thigh: Secondary | ICD-10-CM | POA: Diagnosis not present

## 2018-10-01 DIAGNOSIS — M6281 Muscle weakness (generalized): Secondary | ICD-10-CM | POA: Diagnosis not present

## 2018-10-01 DIAGNOSIS — R269 Unspecified abnormalities of gait and mobility: Secondary | ICD-10-CM | POA: Diagnosis not present

## 2018-10-01 DIAGNOSIS — M25675 Stiffness of left foot, not elsewhere classified: Secondary | ICD-10-CM | POA: Diagnosis not present

## 2018-10-01 DIAGNOSIS — M62551 Muscle wasting and atrophy, not elsewhere classified, right thigh: Secondary | ICD-10-CM | POA: Diagnosis not present

## 2018-10-01 DIAGNOSIS — M25671 Stiffness of right ankle, not elsewhere classified: Secondary | ICD-10-CM | POA: Diagnosis not present

## 2018-10-01 DIAGNOSIS — R262 Difficulty in walking, not elsewhere classified: Secondary | ICD-10-CM | POA: Diagnosis not present

## 2018-10-01 DIAGNOSIS — M25672 Stiffness of left ankle, not elsewhere classified: Secondary | ICD-10-CM | POA: Diagnosis not present

## 2018-10-01 DIAGNOSIS — M25674 Stiffness of right foot, not elsewhere classified: Secondary | ICD-10-CM | POA: Diagnosis not present

## 2018-10-04 DIAGNOSIS — M62551 Muscle wasting and atrophy, not elsewhere classified, right thigh: Secondary | ICD-10-CM | POA: Diagnosis not present

## 2018-10-04 DIAGNOSIS — R262 Difficulty in walking, not elsewhere classified: Secondary | ICD-10-CM | POA: Diagnosis not present

## 2018-10-04 DIAGNOSIS — M25671 Stiffness of right ankle, not elsewhere classified: Secondary | ICD-10-CM | POA: Diagnosis not present

## 2018-10-04 DIAGNOSIS — M6281 Muscle weakness (generalized): Secondary | ICD-10-CM | POA: Diagnosis not present

## 2018-10-04 DIAGNOSIS — M25674 Stiffness of right foot, not elsewhere classified: Secondary | ICD-10-CM | POA: Diagnosis not present

## 2018-10-04 DIAGNOSIS — R269 Unspecified abnormalities of gait and mobility: Secondary | ICD-10-CM | POA: Diagnosis not present

## 2018-10-04 DIAGNOSIS — M25672 Stiffness of left ankle, not elsewhere classified: Secondary | ICD-10-CM | POA: Diagnosis not present

## 2018-10-04 DIAGNOSIS — M62552 Muscle wasting and atrophy, not elsewhere classified, left thigh: Secondary | ICD-10-CM | POA: Diagnosis not present

## 2018-10-04 DIAGNOSIS — M25675 Stiffness of left foot, not elsewhere classified: Secondary | ICD-10-CM | POA: Diagnosis not present

## 2018-10-06 DIAGNOSIS — R262 Difficulty in walking, not elsewhere classified: Secondary | ICD-10-CM | POA: Diagnosis not present

## 2018-10-06 DIAGNOSIS — M25672 Stiffness of left ankle, not elsewhere classified: Secondary | ICD-10-CM | POA: Diagnosis not present

## 2018-10-06 DIAGNOSIS — M6281 Muscle weakness (generalized): Secondary | ICD-10-CM | POA: Diagnosis not present

## 2018-10-06 DIAGNOSIS — R269 Unspecified abnormalities of gait and mobility: Secondary | ICD-10-CM | POA: Diagnosis not present

## 2018-10-06 DIAGNOSIS — M25674 Stiffness of right foot, not elsewhere classified: Secondary | ICD-10-CM | POA: Diagnosis not present

## 2018-10-06 DIAGNOSIS — M25671 Stiffness of right ankle, not elsewhere classified: Secondary | ICD-10-CM | POA: Diagnosis not present

## 2018-10-06 DIAGNOSIS — M25675 Stiffness of left foot, not elsewhere classified: Secondary | ICD-10-CM | POA: Diagnosis not present

## 2018-10-06 DIAGNOSIS — M62552 Muscle wasting and atrophy, not elsewhere classified, left thigh: Secondary | ICD-10-CM | POA: Diagnosis not present

## 2018-10-06 DIAGNOSIS — M62551 Muscle wasting and atrophy, not elsewhere classified, right thigh: Secondary | ICD-10-CM | POA: Diagnosis not present

## 2018-10-08 DIAGNOSIS — M62551 Muscle wasting and atrophy, not elsewhere classified, right thigh: Secondary | ICD-10-CM | POA: Diagnosis not present

## 2018-10-08 DIAGNOSIS — M6281 Muscle weakness (generalized): Secondary | ICD-10-CM | POA: Diagnosis not present

## 2018-10-08 DIAGNOSIS — M25674 Stiffness of right foot, not elsewhere classified: Secondary | ICD-10-CM | POA: Diagnosis not present

## 2018-10-08 DIAGNOSIS — R269 Unspecified abnormalities of gait and mobility: Secondary | ICD-10-CM | POA: Diagnosis not present

## 2018-10-08 DIAGNOSIS — M25672 Stiffness of left ankle, not elsewhere classified: Secondary | ICD-10-CM | POA: Diagnosis not present

## 2018-10-08 DIAGNOSIS — M62552 Muscle wasting and atrophy, not elsewhere classified, left thigh: Secondary | ICD-10-CM | POA: Diagnosis not present

## 2018-10-08 DIAGNOSIS — M25671 Stiffness of right ankle, not elsewhere classified: Secondary | ICD-10-CM | POA: Diagnosis not present

## 2018-10-08 DIAGNOSIS — R262 Difficulty in walking, not elsewhere classified: Secondary | ICD-10-CM | POA: Diagnosis not present

## 2018-10-08 DIAGNOSIS — M25675 Stiffness of left foot, not elsewhere classified: Secondary | ICD-10-CM | POA: Diagnosis not present

## 2018-10-11 DIAGNOSIS — R262 Difficulty in walking, not elsewhere classified: Secondary | ICD-10-CM | POA: Diagnosis not present

## 2018-10-11 DIAGNOSIS — M62552 Muscle wasting and atrophy, not elsewhere classified, left thigh: Secondary | ICD-10-CM | POA: Diagnosis not present

## 2018-10-11 DIAGNOSIS — R269 Unspecified abnormalities of gait and mobility: Secondary | ICD-10-CM | POA: Diagnosis not present

## 2018-10-11 DIAGNOSIS — M25674 Stiffness of right foot, not elsewhere classified: Secondary | ICD-10-CM | POA: Diagnosis not present

## 2018-10-11 DIAGNOSIS — M25675 Stiffness of left foot, not elsewhere classified: Secondary | ICD-10-CM | POA: Diagnosis not present

## 2018-10-11 DIAGNOSIS — M25672 Stiffness of left ankle, not elsewhere classified: Secondary | ICD-10-CM | POA: Diagnosis not present

## 2018-10-11 DIAGNOSIS — M6281 Muscle weakness (generalized): Secondary | ICD-10-CM | POA: Diagnosis not present

## 2018-10-11 DIAGNOSIS — M25671 Stiffness of right ankle, not elsewhere classified: Secondary | ICD-10-CM | POA: Diagnosis not present

## 2018-10-11 DIAGNOSIS — M62551 Muscle wasting and atrophy, not elsewhere classified, right thigh: Secondary | ICD-10-CM | POA: Diagnosis not present

## 2018-10-13 DIAGNOSIS — M25674 Stiffness of right foot, not elsewhere classified: Secondary | ICD-10-CM | POA: Diagnosis not present

## 2018-10-13 DIAGNOSIS — M62552 Muscle wasting and atrophy, not elsewhere classified, left thigh: Secondary | ICD-10-CM | POA: Diagnosis not present

## 2018-10-13 DIAGNOSIS — R262 Difficulty in walking, not elsewhere classified: Secondary | ICD-10-CM | POA: Diagnosis not present

## 2018-10-13 DIAGNOSIS — M6281 Muscle weakness (generalized): Secondary | ICD-10-CM | POA: Diagnosis not present

## 2018-10-13 DIAGNOSIS — R269 Unspecified abnormalities of gait and mobility: Secondary | ICD-10-CM | POA: Diagnosis not present

## 2018-10-13 DIAGNOSIS — M25672 Stiffness of left ankle, not elsewhere classified: Secondary | ICD-10-CM | POA: Diagnosis not present

## 2018-10-13 DIAGNOSIS — M25671 Stiffness of right ankle, not elsewhere classified: Secondary | ICD-10-CM | POA: Diagnosis not present

## 2018-10-13 DIAGNOSIS — M25675 Stiffness of left foot, not elsewhere classified: Secondary | ICD-10-CM | POA: Diagnosis not present

## 2018-10-13 DIAGNOSIS — M62551 Muscle wasting and atrophy, not elsewhere classified, right thigh: Secondary | ICD-10-CM | POA: Diagnosis not present

## 2018-10-19 ENCOUNTER — Ambulatory Visit: Payer: PPO | Admitting: Podiatry

## 2018-10-21 DIAGNOSIS — M25671 Stiffness of right ankle, not elsewhere classified: Secondary | ICD-10-CM | POA: Diagnosis not present

## 2018-10-21 DIAGNOSIS — M25672 Stiffness of left ankle, not elsewhere classified: Secondary | ICD-10-CM | POA: Diagnosis not present

## 2018-10-21 DIAGNOSIS — M25675 Stiffness of left foot, not elsewhere classified: Secondary | ICD-10-CM | POA: Diagnosis not present

## 2018-10-21 DIAGNOSIS — M25674 Stiffness of right foot, not elsewhere classified: Secondary | ICD-10-CM | POA: Diagnosis not present

## 2018-10-21 DIAGNOSIS — M62551 Muscle wasting and atrophy, not elsewhere classified, right thigh: Secondary | ICD-10-CM | POA: Diagnosis not present

## 2018-10-21 DIAGNOSIS — M62552 Muscle wasting and atrophy, not elsewhere classified, left thigh: Secondary | ICD-10-CM | POA: Diagnosis not present

## 2018-10-21 DIAGNOSIS — R262 Difficulty in walking, not elsewhere classified: Secondary | ICD-10-CM | POA: Diagnosis not present

## 2018-10-21 DIAGNOSIS — M6281 Muscle weakness (generalized): Secondary | ICD-10-CM | POA: Diagnosis not present

## 2018-10-21 DIAGNOSIS — R269 Unspecified abnormalities of gait and mobility: Secondary | ICD-10-CM | POA: Diagnosis not present

## 2018-10-22 DIAGNOSIS — M6281 Muscle weakness (generalized): Secondary | ICD-10-CM | POA: Diagnosis not present

## 2018-10-22 DIAGNOSIS — M62552 Muscle wasting and atrophy, not elsewhere classified, left thigh: Secondary | ICD-10-CM | POA: Diagnosis not present

## 2018-10-22 DIAGNOSIS — M62551 Muscle wasting and atrophy, not elsewhere classified, right thigh: Secondary | ICD-10-CM | POA: Diagnosis not present

## 2018-10-22 DIAGNOSIS — M25675 Stiffness of left foot, not elsewhere classified: Secondary | ICD-10-CM | POA: Diagnosis not present

## 2018-10-22 DIAGNOSIS — M25674 Stiffness of right foot, not elsewhere classified: Secondary | ICD-10-CM | POA: Diagnosis not present

## 2018-10-22 DIAGNOSIS — R262 Difficulty in walking, not elsewhere classified: Secondary | ICD-10-CM | POA: Diagnosis not present

## 2018-10-22 DIAGNOSIS — M25671 Stiffness of right ankle, not elsewhere classified: Secondary | ICD-10-CM | POA: Diagnosis not present

## 2018-10-22 DIAGNOSIS — M25672 Stiffness of left ankle, not elsewhere classified: Secondary | ICD-10-CM | POA: Diagnosis not present

## 2018-10-22 DIAGNOSIS — R269 Unspecified abnormalities of gait and mobility: Secondary | ICD-10-CM | POA: Diagnosis not present

## 2018-10-25 DIAGNOSIS — R269 Unspecified abnormalities of gait and mobility: Secondary | ICD-10-CM | POA: Diagnosis not present

## 2018-10-25 DIAGNOSIS — R262 Difficulty in walking, not elsewhere classified: Secondary | ICD-10-CM | POA: Diagnosis not present

## 2018-10-25 DIAGNOSIS — M6281 Muscle weakness (generalized): Secondary | ICD-10-CM | POA: Diagnosis not present

## 2018-10-25 DIAGNOSIS — M25671 Stiffness of right ankle, not elsewhere classified: Secondary | ICD-10-CM | POA: Diagnosis not present

## 2018-10-25 DIAGNOSIS — M25675 Stiffness of left foot, not elsewhere classified: Secondary | ICD-10-CM | POA: Diagnosis not present

## 2018-10-25 DIAGNOSIS — M25674 Stiffness of right foot, not elsewhere classified: Secondary | ICD-10-CM | POA: Diagnosis not present

## 2018-10-25 DIAGNOSIS — M62552 Muscle wasting and atrophy, not elsewhere classified, left thigh: Secondary | ICD-10-CM | POA: Diagnosis not present

## 2018-10-25 DIAGNOSIS — M25672 Stiffness of left ankle, not elsewhere classified: Secondary | ICD-10-CM | POA: Diagnosis not present

## 2018-10-25 DIAGNOSIS — M62551 Muscle wasting and atrophy, not elsewhere classified, right thigh: Secondary | ICD-10-CM | POA: Diagnosis not present

## 2018-10-27 DIAGNOSIS — M25672 Stiffness of left ankle, not elsewhere classified: Secondary | ICD-10-CM | POA: Diagnosis not present

## 2018-10-27 DIAGNOSIS — M25675 Stiffness of left foot, not elsewhere classified: Secondary | ICD-10-CM | POA: Diagnosis not present

## 2018-10-27 DIAGNOSIS — R262 Difficulty in walking, not elsewhere classified: Secondary | ICD-10-CM | POA: Diagnosis not present

## 2018-10-27 DIAGNOSIS — M25671 Stiffness of right ankle, not elsewhere classified: Secondary | ICD-10-CM | POA: Diagnosis not present

## 2018-10-27 DIAGNOSIS — M6281 Muscle weakness (generalized): Secondary | ICD-10-CM | POA: Diagnosis not present

## 2018-10-27 DIAGNOSIS — M25674 Stiffness of right foot, not elsewhere classified: Secondary | ICD-10-CM | POA: Diagnosis not present

## 2018-10-27 DIAGNOSIS — M62552 Muscle wasting and atrophy, not elsewhere classified, left thigh: Secondary | ICD-10-CM | POA: Diagnosis not present

## 2018-10-27 DIAGNOSIS — M62551 Muscle wasting and atrophy, not elsewhere classified, right thigh: Secondary | ICD-10-CM | POA: Diagnosis not present

## 2018-10-27 DIAGNOSIS — R269 Unspecified abnormalities of gait and mobility: Secondary | ICD-10-CM | POA: Diagnosis not present

## 2018-11-01 DIAGNOSIS — R262 Difficulty in walking, not elsewhere classified: Secondary | ICD-10-CM | POA: Diagnosis not present

## 2018-11-01 DIAGNOSIS — M25675 Stiffness of left foot, not elsewhere classified: Secondary | ICD-10-CM | POA: Diagnosis not present

## 2018-11-01 DIAGNOSIS — M62551 Muscle wasting and atrophy, not elsewhere classified, right thigh: Secondary | ICD-10-CM | POA: Diagnosis not present

## 2018-11-01 DIAGNOSIS — M25674 Stiffness of right foot, not elsewhere classified: Secondary | ICD-10-CM | POA: Diagnosis not present

## 2018-11-01 DIAGNOSIS — M25671 Stiffness of right ankle, not elsewhere classified: Secondary | ICD-10-CM | POA: Diagnosis not present

## 2018-11-01 DIAGNOSIS — M25672 Stiffness of left ankle, not elsewhere classified: Secondary | ICD-10-CM | POA: Diagnosis not present

## 2018-11-01 DIAGNOSIS — M62552 Muscle wasting and atrophy, not elsewhere classified, left thigh: Secondary | ICD-10-CM | POA: Diagnosis not present

## 2018-11-01 DIAGNOSIS — R269 Unspecified abnormalities of gait and mobility: Secondary | ICD-10-CM | POA: Diagnosis not present

## 2018-11-01 DIAGNOSIS — M6281 Muscle weakness (generalized): Secondary | ICD-10-CM | POA: Diagnosis not present

## 2018-11-03 DIAGNOSIS — M6281 Muscle weakness (generalized): Secondary | ICD-10-CM | POA: Diagnosis not present

## 2018-11-03 DIAGNOSIS — I739 Peripheral vascular disease, unspecified: Secondary | ICD-10-CM | POA: Diagnosis not present

## 2018-11-03 DIAGNOSIS — M25675 Stiffness of left foot, not elsewhere classified: Secondary | ICD-10-CM | POA: Diagnosis not present

## 2018-11-03 DIAGNOSIS — R262 Difficulty in walking, not elsewhere classified: Secondary | ICD-10-CM | POA: Diagnosis not present

## 2018-11-03 DIAGNOSIS — M62551 Muscle wasting and atrophy, not elsewhere classified, right thigh: Secondary | ICD-10-CM | POA: Diagnosis not present

## 2018-11-03 DIAGNOSIS — M62552 Muscle wasting and atrophy, not elsewhere classified, left thigh: Secondary | ICD-10-CM | POA: Diagnosis not present

## 2018-11-03 DIAGNOSIS — E221 Hyperprolactinemia: Secondary | ICD-10-CM | POA: Diagnosis not present

## 2018-11-03 DIAGNOSIS — G939 Disorder of brain, unspecified: Secondary | ICD-10-CM | POA: Diagnosis not present

## 2018-11-03 DIAGNOSIS — R269 Unspecified abnormalities of gait and mobility: Secondary | ICD-10-CM | POA: Diagnosis not present

## 2018-11-03 DIAGNOSIS — M25672 Stiffness of left ankle, not elsewhere classified: Secondary | ICD-10-CM | POA: Diagnosis not present

## 2018-11-03 DIAGNOSIS — I771 Stricture of artery: Secondary | ICD-10-CM | POA: Diagnosis not present

## 2018-11-03 DIAGNOSIS — M25674 Stiffness of right foot, not elsewhere classified: Secondary | ICD-10-CM | POA: Diagnosis not present

## 2018-11-03 DIAGNOSIS — M25671 Stiffness of right ankle, not elsewhere classified: Secondary | ICD-10-CM | POA: Diagnosis not present

## 2018-11-09 ENCOUNTER — Ambulatory Visit: Payer: PPO | Admitting: Podiatry

## 2018-11-09 ENCOUNTER — Encounter: Payer: Self-pay | Admitting: Podiatry

## 2018-11-09 ENCOUNTER — Other Ambulatory Visit: Payer: Self-pay

## 2018-11-09 VITALS — Temp 96.5°F | Resp 16

## 2018-11-09 DIAGNOSIS — B351 Tinea unguium: Secondary | ICD-10-CM | POA: Diagnosis not present

## 2018-11-09 DIAGNOSIS — M2012 Hallux valgus (acquired), left foot: Secondary | ICD-10-CM | POA: Diagnosis not present

## 2018-11-09 DIAGNOSIS — M6281 Muscle weakness (generalized): Secondary | ICD-10-CM

## 2018-11-09 DIAGNOSIS — M2042 Other hammer toe(s) (acquired), left foot: Secondary | ICD-10-CM | POA: Diagnosis not present

## 2018-11-09 DIAGNOSIS — M79676 Pain in unspecified toe(s): Secondary | ICD-10-CM

## 2018-11-09 DIAGNOSIS — M79609 Pain in unspecified limb: Secondary | ICD-10-CM

## 2018-11-09 DIAGNOSIS — R531 Weakness: Secondary | ICD-10-CM | POA: Diagnosis not present

## 2018-11-09 DIAGNOSIS — M2041 Other hammer toe(s) (acquired), right foot: Secondary | ICD-10-CM | POA: Diagnosis not present

## 2018-11-09 NOTE — Progress Notes (Signed)
Subjective:  Patient ID: Sandra Lynch, female    DOB: 03-Apr-1935,  MRN: [id number]  Chief Complaint  Patient presents with  . balance brace    2 mo f/u after balance brace Pt. states," still not walking good. No pain, but it's tender, swollen and can't walk wihtout a walker." tx: walkder -pt denies N/V/F/Ch    DOS: 02/11/18 Procedure: Right first metatarsophalangeal joint fusion, lesser metatarsal head resection, hammertoe repair 32 and 7  84 y.o. female returns for f/u. Still having difficulty walking without a walker.  Review of Systems: Negative except as noted in the HPI. Denies N/V/F/Ch.  Past Medical History:  Diagnosis Date  . Allergic rhinitis   . Anxiety   . Arthritis    "scattered in my joints" (02/11/2018)  . Atrial fibrillation ([place])   . Cough   . Essential hypertension   . GERD (gastroesophageal reflux disease)   . Hyperlipidemia   . Hyponatremia   . Pneumonia ~ 11/2017   walking pneumonia  . Pneumonia    "as a child"  . Urge incontinence     Current Outpatient Medications:  .  alendronate (FOSAMAX) 70 MG tablet, PLEASE SEE ATTACHED FOR DETAILED DIRECTIONS, Disp: , Rfl: 3 .  ALPRAZolam (XANAX) 0.25 MG tablet, Take 1-2 tablets (0.25-0.5 mg total) by mouth 2 (two) times daily as needed for anxiety., Disp: 2 tablet, Rfl: 0 .  amLODipine (NORVASC) 5 MG tablet, Take 5 mg by mouth daily., Disp: , Rfl: 3 .  aspirin EC 81 MG tablet, Take 1 tablet by mouth daily., Disp: , Rfl:  .  Calcium Carbonate-Vitamin D (CALTRATE 600+D PO), Take 1 tablet by mouth daily., Disp: , Rfl:  .  enoxaparin (LOVENOX) 30 MG/0.3ML injection, Inject 0.3 mLs (30 mg total) into the skin daily., Disp: 14 Syringe, Rfl: 0 .  fluticasone (FLONASE) 50 MCG/ACT nasal spray, Place 1 spray into both nostrils daily., Disp: , Rfl: 3 .  gabapentin (NEURONTIN) 300 MG capsule, Take 1 capsule (300 mg total) by mouth at bedtime., Disp: 30 capsule, Rfl: 0 .  Multiple Vitamins-Minerals (CENTRUM SILVER 50+WOMEN  PO), Take 1 tablet by mouth daily. , Disp: , Rfl:  .  MYRBETRIQ 25 MG TB24 tablet, Take 25 mg by mouth daily., Disp: , Rfl: 4 .  Omega-3 Fatty Acids (FISH OIL) 1000 MG CAPS, Take 1 tablet by mouth daily., Disp: , Rfl:  .  omeprazole (PRILOSEC) 20 MG capsule, Take 20 mg by mouth daily as needed (heartburn). , Disp: , Rfl:  .  oxyCODONE-acetaminophen (PERCOCET) 10-325 MG tablet, Take 1 tablet by mouth every 4 (four) hours as needed for pain., Disp: 20 tablet, Rfl: 0 .  pravastatin (PRAVACHOL) 40 MG tablet, Take 40 mg by mouth at bedtime., Disp: , Rfl: 3 .  sodium chloride (OCEAN) 0.65 % SOLN nasal spray, Place 1 spray into both nostrils as needed for congestion., Disp: , Rfl:  .  traMADol (ULTRAM) 50 MG tablet, Take 1 tablet (50 mg total) by mouth every 6 (six) hours as needed. for pain, Disp: 30 tablet, Rfl: 0 .  valsartan (DIOVAN) 40 MG tablet, Take 40 mg by mouth daily. for blood pressure, Disp: , Rfl: 0  Social History   Tobacco Use  Smoking Status Never Smoker  Smokeless Tobacco Never Used    Allergies  Allergen Reactions  . Lisinopril Cough   Objective:   Vitals:   11/09/18 1415  Resp: 16  Temp: (!) 96.5 F (35.8 C)   There is no height  or weight on file to calculate BMI. Constitutional Well developed. Well nourished.  Vascular Foot warm and well perfused. Capillary refill normal to all digits.   Neurologic Normal speech. Oriented to person, place, and time. Epicritic sensation to light touch grossly present bilaterally.  Dermatologic Skin well healed. Punctate keratosis sub-met for left foot  Orthopedic: No tenderness to palpation noted about the surgical site. 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   Radiographs: None today Assessment:   1. Hammer toe of right foot   2. Hallux valgus, left   3. Hammertoe of left foot   4. Muscle weakness   5. Generalized  weakness   6. Pain due to onychomycosis of nail    Plan:  Patient was evaluated and treated and all questions answered.  S/p foot surgery right -Well healed surgical foot. -Again, ambulating without pain, but with significant loss of strength.  -I discussed that I believe the intended goal of surgery, that is, she is having no pain while walking, to have been achieved. She has not regained her pre-operative strength, however. I recommend she continue PT until maximal medical improvement. -Will swap out MBB shoe for a larger size. -Tubgrip applied bilat for edema. Will send info for compression socks.  HAV deformity left, hammertoe deformity, metatarsalgia, fat pad atrophy -Patient would like to hold off of surgery. I agree in that I do not think this would be beneficial as of right now.   Onychomycosis with pain -Nails debrided x10  Procedure: Nail Debridement Rationale: Pain  Type of Debridement: manual, sharp debridement. Instrumentation: Nail nipper, rotary burr. Number of Nails: 10  Return for shoe pickup.

## 2018-11-10 DIAGNOSIS — M25674 Stiffness of right foot, not elsewhere classified: Secondary | ICD-10-CM | POA: Diagnosis not present

## 2018-11-10 DIAGNOSIS — M62552 Muscle wasting and atrophy, not elsewhere classified, left thigh: Secondary | ICD-10-CM | POA: Diagnosis not present

## 2018-11-10 DIAGNOSIS — M25671 Stiffness of right ankle, not elsewhere classified: Secondary | ICD-10-CM | POA: Diagnosis not present

## 2018-11-10 DIAGNOSIS — R262 Difficulty in walking, not elsewhere classified: Secondary | ICD-10-CM | POA: Diagnosis not present

## 2018-11-10 DIAGNOSIS — R269 Unspecified abnormalities of gait and mobility: Secondary | ICD-10-CM | POA: Diagnosis not present

## 2018-11-10 DIAGNOSIS — M6281 Muscle weakness (generalized): Secondary | ICD-10-CM | POA: Diagnosis not present

## 2018-11-10 DIAGNOSIS — M25675 Stiffness of left foot, not elsewhere classified: Secondary | ICD-10-CM | POA: Diagnosis not present

## 2018-11-10 DIAGNOSIS — M62551 Muscle wasting and atrophy, not elsewhere classified, right thigh: Secondary | ICD-10-CM | POA: Diagnosis not present

## 2018-11-10 DIAGNOSIS — M25672 Stiffness of left ankle, not elsewhere classified: Secondary | ICD-10-CM | POA: Diagnosis not present

## 2018-11-12 DIAGNOSIS — M62552 Muscle wasting and atrophy, not elsewhere classified, left thigh: Secondary | ICD-10-CM | POA: Diagnosis not present

## 2018-11-12 DIAGNOSIS — M25672 Stiffness of left ankle, not elsewhere classified: Secondary | ICD-10-CM | POA: Diagnosis not present

## 2018-11-12 DIAGNOSIS — M62551 Muscle wasting and atrophy, not elsewhere classified, right thigh: Secondary | ICD-10-CM | POA: Diagnosis not present

## 2018-11-12 DIAGNOSIS — M25675 Stiffness of left foot, not elsewhere classified: Secondary | ICD-10-CM | POA: Diagnosis not present

## 2018-11-12 DIAGNOSIS — M25674 Stiffness of right foot, not elsewhere classified: Secondary | ICD-10-CM | POA: Diagnosis not present

## 2018-11-12 DIAGNOSIS — M6281 Muscle weakness (generalized): Secondary | ICD-10-CM | POA: Diagnosis not present

## 2018-11-12 DIAGNOSIS — M25671 Stiffness of right ankle, not elsewhere classified: Secondary | ICD-10-CM | POA: Diagnosis not present

## 2018-11-12 DIAGNOSIS — R262 Difficulty in walking, not elsewhere classified: Secondary | ICD-10-CM | POA: Diagnosis not present

## 2018-11-12 DIAGNOSIS — R269 Unspecified abnormalities of gait and mobility: Secondary | ICD-10-CM | POA: Diagnosis not present

## 2018-11-15 DIAGNOSIS — M25674 Stiffness of right foot, not elsewhere classified: Secondary | ICD-10-CM | POA: Diagnosis not present

## 2018-11-15 DIAGNOSIS — R262 Difficulty in walking, not elsewhere classified: Secondary | ICD-10-CM | POA: Diagnosis not present

## 2018-11-15 DIAGNOSIS — M25671 Stiffness of right ankle, not elsewhere classified: Secondary | ICD-10-CM | POA: Diagnosis not present

## 2018-11-15 DIAGNOSIS — M6281 Muscle weakness (generalized): Secondary | ICD-10-CM | POA: Diagnosis not present

## 2018-11-15 DIAGNOSIS — R269 Unspecified abnormalities of gait and mobility: Secondary | ICD-10-CM | POA: Diagnosis not present

## 2018-11-15 DIAGNOSIS — M62552 Muscle wasting and atrophy, not elsewhere classified, left thigh: Secondary | ICD-10-CM | POA: Diagnosis not present

## 2018-11-15 DIAGNOSIS — M25675 Stiffness of left foot, not elsewhere classified: Secondary | ICD-10-CM | POA: Diagnosis not present

## 2018-11-15 DIAGNOSIS — M25672 Stiffness of left ankle, not elsewhere classified: Secondary | ICD-10-CM | POA: Diagnosis not present

## 2018-11-15 DIAGNOSIS — M62551 Muscle wasting and atrophy, not elsewhere classified, right thigh: Secondary | ICD-10-CM | POA: Diagnosis not present

## 2018-11-17 DIAGNOSIS — R269 Unspecified abnormalities of gait and mobility: Secondary | ICD-10-CM | POA: Diagnosis not present

## 2018-11-17 DIAGNOSIS — M62551 Muscle wasting and atrophy, not elsewhere classified, right thigh: Secondary | ICD-10-CM | POA: Diagnosis not present

## 2018-11-17 DIAGNOSIS — R262 Difficulty in walking, not elsewhere classified: Secondary | ICD-10-CM | POA: Diagnosis not present

## 2018-11-17 DIAGNOSIS — M6281 Muscle weakness (generalized): Secondary | ICD-10-CM | POA: Diagnosis not present

## 2018-11-17 DIAGNOSIS — M25674 Stiffness of right foot, not elsewhere classified: Secondary | ICD-10-CM | POA: Diagnosis not present

## 2018-11-17 DIAGNOSIS — M62552 Muscle wasting and atrophy, not elsewhere classified, left thigh: Secondary | ICD-10-CM | POA: Diagnosis not present

## 2018-11-17 DIAGNOSIS — M25672 Stiffness of left ankle, not elsewhere classified: Secondary | ICD-10-CM | POA: Diagnosis not present

## 2018-11-17 DIAGNOSIS — M25671 Stiffness of right ankle, not elsewhere classified: Secondary | ICD-10-CM | POA: Diagnosis not present

## 2018-11-17 DIAGNOSIS — M25675 Stiffness of left foot, not elsewhere classified: Secondary | ICD-10-CM | POA: Diagnosis not present

## 2018-11-19 DIAGNOSIS — M62552 Muscle wasting and atrophy, not elsewhere classified, left thigh: Secondary | ICD-10-CM | POA: Diagnosis not present

## 2018-11-19 DIAGNOSIS — M62551 Muscle wasting and atrophy, not elsewhere classified, right thigh: Secondary | ICD-10-CM | POA: Diagnosis not present

## 2018-11-19 DIAGNOSIS — M25674 Stiffness of right foot, not elsewhere classified: Secondary | ICD-10-CM | POA: Diagnosis not present

## 2018-11-19 DIAGNOSIS — R262 Difficulty in walking, not elsewhere classified: Secondary | ICD-10-CM | POA: Diagnosis not present

## 2018-11-19 DIAGNOSIS — M6281 Muscle weakness (generalized): Secondary | ICD-10-CM | POA: Diagnosis not present

## 2018-11-19 DIAGNOSIS — R269 Unspecified abnormalities of gait and mobility: Secondary | ICD-10-CM | POA: Diagnosis not present

## 2018-11-19 DIAGNOSIS — M25671 Stiffness of right ankle, not elsewhere classified: Secondary | ICD-10-CM | POA: Diagnosis not present

## 2018-11-19 DIAGNOSIS — M25675 Stiffness of left foot, not elsewhere classified: Secondary | ICD-10-CM | POA: Diagnosis not present

## 2018-11-19 DIAGNOSIS — M25672 Stiffness of left ankle, not elsewhere classified: Secondary | ICD-10-CM | POA: Diagnosis not present

## 2018-11-22 DIAGNOSIS — M25672 Stiffness of left ankle, not elsewhere classified: Secondary | ICD-10-CM | POA: Diagnosis not present

## 2018-11-22 DIAGNOSIS — M62551 Muscle wasting and atrophy, not elsewhere classified, right thigh: Secondary | ICD-10-CM | POA: Diagnosis not present

## 2018-11-22 DIAGNOSIS — M25671 Stiffness of right ankle, not elsewhere classified: Secondary | ICD-10-CM | POA: Diagnosis not present

## 2018-11-22 DIAGNOSIS — M62552 Muscle wasting and atrophy, not elsewhere classified, left thigh: Secondary | ICD-10-CM | POA: Diagnosis not present

## 2018-11-22 DIAGNOSIS — R262 Difficulty in walking, not elsewhere classified: Secondary | ICD-10-CM | POA: Diagnosis not present

## 2018-11-22 DIAGNOSIS — R269 Unspecified abnormalities of gait and mobility: Secondary | ICD-10-CM | POA: Diagnosis not present

## 2018-11-22 DIAGNOSIS — M25675 Stiffness of left foot, not elsewhere classified: Secondary | ICD-10-CM | POA: Diagnosis not present

## 2018-11-22 DIAGNOSIS — M25674 Stiffness of right foot, not elsewhere classified: Secondary | ICD-10-CM | POA: Diagnosis not present

## 2018-11-22 DIAGNOSIS — M6281 Muscle weakness (generalized): Secondary | ICD-10-CM | POA: Diagnosis not present

## 2018-11-24 DIAGNOSIS — M25671 Stiffness of right ankle, not elsewhere classified: Secondary | ICD-10-CM | POA: Diagnosis not present

## 2018-11-24 DIAGNOSIS — M25675 Stiffness of left foot, not elsewhere classified: Secondary | ICD-10-CM | POA: Diagnosis not present

## 2018-11-24 DIAGNOSIS — M25674 Stiffness of right foot, not elsewhere classified: Secondary | ICD-10-CM | POA: Diagnosis not present

## 2018-11-24 DIAGNOSIS — M62552 Muscle wasting and atrophy, not elsewhere classified, left thigh: Secondary | ICD-10-CM | POA: Diagnosis not present

## 2018-11-24 DIAGNOSIS — M25672 Stiffness of left ankle, not elsewhere classified: Secondary | ICD-10-CM | POA: Diagnosis not present

## 2018-11-24 DIAGNOSIS — R262 Difficulty in walking, not elsewhere classified: Secondary | ICD-10-CM | POA: Diagnosis not present

## 2018-11-24 DIAGNOSIS — R269 Unspecified abnormalities of gait and mobility: Secondary | ICD-10-CM | POA: Diagnosis not present

## 2018-11-24 DIAGNOSIS — M62551 Muscle wasting and atrophy, not elsewhere classified, right thigh: Secondary | ICD-10-CM | POA: Diagnosis not present

## 2018-11-24 DIAGNOSIS — M6281 Muscle weakness (generalized): Secondary | ICD-10-CM | POA: Diagnosis not present

## 2018-11-26 DIAGNOSIS — M25671 Stiffness of right ankle, not elsewhere classified: Secondary | ICD-10-CM | POA: Diagnosis not present

## 2018-11-26 DIAGNOSIS — M25672 Stiffness of left ankle, not elsewhere classified: Secondary | ICD-10-CM | POA: Diagnosis not present

## 2018-11-26 DIAGNOSIS — M25675 Stiffness of left foot, not elsewhere classified: Secondary | ICD-10-CM | POA: Diagnosis not present

## 2018-11-26 DIAGNOSIS — M62552 Muscle wasting and atrophy, not elsewhere classified, left thigh: Secondary | ICD-10-CM | POA: Diagnosis not present

## 2018-11-26 DIAGNOSIS — M6281 Muscle weakness (generalized): Secondary | ICD-10-CM | POA: Diagnosis not present

## 2018-11-26 DIAGNOSIS — M62551 Muscle wasting and atrophy, not elsewhere classified, right thigh: Secondary | ICD-10-CM | POA: Diagnosis not present

## 2018-11-26 DIAGNOSIS — R269 Unspecified abnormalities of gait and mobility: Secondary | ICD-10-CM | POA: Diagnosis not present

## 2018-11-26 DIAGNOSIS — M25674 Stiffness of right foot, not elsewhere classified: Secondary | ICD-10-CM | POA: Diagnosis not present

## 2018-11-26 DIAGNOSIS — R262 Difficulty in walking, not elsewhere classified: Secondary | ICD-10-CM | POA: Diagnosis not present

## 2018-11-29 DIAGNOSIS — M1711 Unilateral primary osteoarthritis, right knee: Secondary | ICD-10-CM | POA: Diagnosis not present

## 2018-11-30 ENCOUNTER — Other Ambulatory Visit: Payer: Self-pay | Admitting: Sports Medicine

## 2018-12-06 DIAGNOSIS — M25675 Stiffness of left foot, not elsewhere classified: Secondary | ICD-10-CM | POA: Diagnosis not present

## 2018-12-06 DIAGNOSIS — R262 Difficulty in walking, not elsewhere classified: Secondary | ICD-10-CM | POA: Diagnosis not present

## 2018-12-06 DIAGNOSIS — R269 Unspecified abnormalities of gait and mobility: Secondary | ICD-10-CM | POA: Diagnosis not present

## 2018-12-06 DIAGNOSIS — M62551 Muscle wasting and atrophy, not elsewhere classified, right thigh: Secondary | ICD-10-CM | POA: Diagnosis not present

## 2018-12-06 DIAGNOSIS — M6281 Muscle weakness (generalized): Secondary | ICD-10-CM | POA: Diagnosis not present

## 2018-12-06 DIAGNOSIS — M25672 Stiffness of left ankle, not elsewhere classified: Secondary | ICD-10-CM | POA: Diagnosis not present

## 2018-12-06 DIAGNOSIS — M62552 Muscle wasting and atrophy, not elsewhere classified, left thigh: Secondary | ICD-10-CM | POA: Diagnosis not present

## 2018-12-06 DIAGNOSIS — M25671 Stiffness of right ankle, not elsewhere classified: Secondary | ICD-10-CM | POA: Diagnosis not present

## 2018-12-06 DIAGNOSIS — M25674 Stiffness of right foot, not elsewhere classified: Secondary | ICD-10-CM | POA: Diagnosis not present

## 2018-12-08 DIAGNOSIS — M25674 Stiffness of right foot, not elsewhere classified: Secondary | ICD-10-CM | POA: Diagnosis not present

## 2018-12-08 DIAGNOSIS — M25675 Stiffness of left foot, not elsewhere classified: Secondary | ICD-10-CM | POA: Diagnosis not present

## 2018-12-08 DIAGNOSIS — M25672 Stiffness of left ankle, not elsewhere classified: Secondary | ICD-10-CM | POA: Diagnosis not present

## 2018-12-08 DIAGNOSIS — M62552 Muscle wasting and atrophy, not elsewhere classified, left thigh: Secondary | ICD-10-CM | POA: Diagnosis not present

## 2018-12-08 DIAGNOSIS — R269 Unspecified abnormalities of gait and mobility: Secondary | ICD-10-CM | POA: Diagnosis not present

## 2018-12-08 DIAGNOSIS — R262 Difficulty in walking, not elsewhere classified: Secondary | ICD-10-CM | POA: Diagnosis not present

## 2018-12-08 DIAGNOSIS — M25671 Stiffness of right ankle, not elsewhere classified: Secondary | ICD-10-CM | POA: Diagnosis not present

## 2018-12-08 DIAGNOSIS — M62551 Muscle wasting and atrophy, not elsewhere classified, right thigh: Secondary | ICD-10-CM | POA: Diagnosis not present

## 2018-12-08 DIAGNOSIS — M6281 Muscle weakness (generalized): Secondary | ICD-10-CM | POA: Diagnosis not present

## 2018-12-09 DIAGNOSIS — M25511 Pain in right shoulder: Secondary | ICD-10-CM | POA: Diagnosis not present

## 2018-12-10 DIAGNOSIS — R269 Unspecified abnormalities of gait and mobility: Secondary | ICD-10-CM | POA: Diagnosis not present

## 2018-12-10 DIAGNOSIS — M25671 Stiffness of right ankle, not elsewhere classified: Secondary | ICD-10-CM | POA: Diagnosis not present

## 2018-12-10 DIAGNOSIS — M62552 Muscle wasting and atrophy, not elsewhere classified, left thigh: Secondary | ICD-10-CM | POA: Diagnosis not present

## 2018-12-10 DIAGNOSIS — R262 Difficulty in walking, not elsewhere classified: Secondary | ICD-10-CM | POA: Diagnosis not present

## 2018-12-10 DIAGNOSIS — M25675 Stiffness of left foot, not elsewhere classified: Secondary | ICD-10-CM | POA: Diagnosis not present

## 2018-12-10 DIAGNOSIS — M6281 Muscle weakness (generalized): Secondary | ICD-10-CM | POA: Diagnosis not present

## 2018-12-10 DIAGNOSIS — M25672 Stiffness of left ankle, not elsewhere classified: Secondary | ICD-10-CM | POA: Diagnosis not present

## 2018-12-10 DIAGNOSIS — M62551 Muscle wasting and atrophy, not elsewhere classified, right thigh: Secondary | ICD-10-CM | POA: Diagnosis not present

## 2018-12-10 DIAGNOSIS — M25674 Stiffness of right foot, not elsewhere classified: Secondary | ICD-10-CM | POA: Diagnosis not present

## 2018-12-13 DIAGNOSIS — R262 Difficulty in walking, not elsewhere classified: Secondary | ICD-10-CM | POA: Diagnosis not present

## 2018-12-13 DIAGNOSIS — R269 Unspecified abnormalities of gait and mobility: Secondary | ICD-10-CM | POA: Diagnosis not present

## 2018-12-13 DIAGNOSIS — M25675 Stiffness of left foot, not elsewhere classified: Secondary | ICD-10-CM | POA: Diagnosis not present

## 2018-12-13 DIAGNOSIS — M62552 Muscle wasting and atrophy, not elsewhere classified, left thigh: Secondary | ICD-10-CM | POA: Diagnosis not present

## 2018-12-13 DIAGNOSIS — M62551 Muscle wasting and atrophy, not elsewhere classified, right thigh: Secondary | ICD-10-CM | POA: Diagnosis not present

## 2018-12-13 DIAGNOSIS — M25674 Stiffness of right foot, not elsewhere classified: Secondary | ICD-10-CM | POA: Diagnosis not present

## 2018-12-13 DIAGNOSIS — M25672 Stiffness of left ankle, not elsewhere classified: Secondary | ICD-10-CM | POA: Diagnosis not present

## 2018-12-13 DIAGNOSIS — M25671 Stiffness of right ankle, not elsewhere classified: Secondary | ICD-10-CM | POA: Diagnosis not present

## 2018-12-13 DIAGNOSIS — M6281 Muscle weakness (generalized): Secondary | ICD-10-CM | POA: Diagnosis not present

## 2018-12-17 DIAGNOSIS — M25674 Stiffness of right foot, not elsewhere classified: Secondary | ICD-10-CM | POA: Diagnosis not present

## 2018-12-17 DIAGNOSIS — M25672 Stiffness of left ankle, not elsewhere classified: Secondary | ICD-10-CM | POA: Diagnosis not present

## 2018-12-17 DIAGNOSIS — R262 Difficulty in walking, not elsewhere classified: Secondary | ICD-10-CM | POA: Diagnosis not present

## 2018-12-17 DIAGNOSIS — M6281 Muscle weakness (generalized): Secondary | ICD-10-CM | POA: Diagnosis not present

## 2018-12-17 DIAGNOSIS — M25675 Stiffness of left foot, not elsewhere classified: Secondary | ICD-10-CM | POA: Diagnosis not present

## 2018-12-17 DIAGNOSIS — M62551 Muscle wasting and atrophy, not elsewhere classified, right thigh: Secondary | ICD-10-CM | POA: Diagnosis not present

## 2018-12-17 DIAGNOSIS — M62552 Muscle wasting and atrophy, not elsewhere classified, left thigh: Secondary | ICD-10-CM | POA: Diagnosis not present

## 2018-12-17 DIAGNOSIS — R269 Unspecified abnormalities of gait and mobility: Secondary | ICD-10-CM | POA: Diagnosis not present

## 2018-12-17 DIAGNOSIS — M25671 Stiffness of right ankle, not elsewhere classified: Secondary | ICD-10-CM | POA: Diagnosis not present

## 2018-12-21 DIAGNOSIS — F411 Generalized anxiety disorder: Secondary | ICD-10-CM | POA: Diagnosis not present

## 2018-12-21 DIAGNOSIS — M25511 Pain in right shoulder: Secondary | ICD-10-CM | POA: Diagnosis not present

## 2018-12-23 DIAGNOSIS — M25671 Stiffness of right ankle, not elsewhere classified: Secondary | ICD-10-CM | POA: Diagnosis not present

## 2018-12-23 DIAGNOSIS — R269 Unspecified abnormalities of gait and mobility: Secondary | ICD-10-CM | POA: Diagnosis not present

## 2018-12-23 DIAGNOSIS — M62552 Muscle wasting and atrophy, not elsewhere classified, left thigh: Secondary | ICD-10-CM | POA: Diagnosis not present

## 2018-12-23 DIAGNOSIS — M25675 Stiffness of left foot, not elsewhere classified: Secondary | ICD-10-CM | POA: Diagnosis not present

## 2018-12-23 DIAGNOSIS — M25674 Stiffness of right foot, not elsewhere classified: Secondary | ICD-10-CM | POA: Diagnosis not present

## 2018-12-23 DIAGNOSIS — M6281 Muscle weakness (generalized): Secondary | ICD-10-CM | POA: Diagnosis not present

## 2018-12-23 DIAGNOSIS — M25672 Stiffness of left ankle, not elsewhere classified: Secondary | ICD-10-CM | POA: Diagnosis not present

## 2018-12-23 DIAGNOSIS — M62551 Muscle wasting and atrophy, not elsewhere classified, right thigh: Secondary | ICD-10-CM | POA: Diagnosis not present

## 2018-12-23 DIAGNOSIS — R262 Difficulty in walking, not elsewhere classified: Secondary | ICD-10-CM | POA: Diagnosis not present

## 2018-12-27 DIAGNOSIS — R262 Difficulty in walking, not elsewhere classified: Secondary | ICD-10-CM | POA: Diagnosis not present

## 2018-12-27 DIAGNOSIS — M25675 Stiffness of left foot, not elsewhere classified: Secondary | ICD-10-CM | POA: Diagnosis not present

## 2018-12-27 DIAGNOSIS — M25672 Stiffness of left ankle, not elsewhere classified: Secondary | ICD-10-CM | POA: Diagnosis not present

## 2018-12-27 DIAGNOSIS — M6281 Muscle weakness (generalized): Secondary | ICD-10-CM | POA: Diagnosis not present

## 2018-12-27 DIAGNOSIS — M25674 Stiffness of right foot, not elsewhere classified: Secondary | ICD-10-CM | POA: Diagnosis not present

## 2018-12-27 DIAGNOSIS — M25671 Stiffness of right ankle, not elsewhere classified: Secondary | ICD-10-CM | POA: Diagnosis not present

## 2018-12-27 DIAGNOSIS — M62552 Muscle wasting and atrophy, not elsewhere classified, left thigh: Secondary | ICD-10-CM | POA: Diagnosis not present

## 2018-12-27 DIAGNOSIS — M62551 Muscle wasting and atrophy, not elsewhere classified, right thigh: Secondary | ICD-10-CM | POA: Diagnosis not present

## 2018-12-27 DIAGNOSIS — R269 Unspecified abnormalities of gait and mobility: Secondary | ICD-10-CM | POA: Diagnosis not present

## 2018-12-29 DIAGNOSIS — M62552 Muscle wasting and atrophy, not elsewhere classified, left thigh: Secondary | ICD-10-CM | POA: Diagnosis not present

## 2018-12-29 DIAGNOSIS — M6281 Muscle weakness (generalized): Secondary | ICD-10-CM | POA: Diagnosis not present

## 2018-12-29 DIAGNOSIS — M25674 Stiffness of right foot, not elsewhere classified: Secondary | ICD-10-CM | POA: Diagnosis not present

## 2018-12-29 DIAGNOSIS — M62551 Muscle wasting and atrophy, not elsewhere classified, right thigh: Secondary | ICD-10-CM | POA: Diagnosis not present

## 2018-12-29 DIAGNOSIS — M25671 Stiffness of right ankle, not elsewhere classified: Secondary | ICD-10-CM | POA: Diagnosis not present

## 2018-12-29 DIAGNOSIS — M25675 Stiffness of left foot, not elsewhere classified: Secondary | ICD-10-CM | POA: Diagnosis not present

## 2018-12-29 DIAGNOSIS — R269 Unspecified abnormalities of gait and mobility: Secondary | ICD-10-CM | POA: Diagnosis not present

## 2018-12-29 DIAGNOSIS — M25672 Stiffness of left ankle, not elsewhere classified: Secondary | ICD-10-CM | POA: Diagnosis not present

## 2018-12-29 DIAGNOSIS — R262 Difficulty in walking, not elsewhere classified: Secondary | ICD-10-CM | POA: Diagnosis not present

## 2018-12-31 DIAGNOSIS — M25672 Stiffness of left ankle, not elsewhere classified: Secondary | ICD-10-CM | POA: Diagnosis not present

## 2018-12-31 DIAGNOSIS — M62552 Muscle wasting and atrophy, not elsewhere classified, left thigh: Secondary | ICD-10-CM | POA: Diagnosis not present

## 2018-12-31 DIAGNOSIS — R269 Unspecified abnormalities of gait and mobility: Secondary | ICD-10-CM | POA: Diagnosis not present

## 2018-12-31 DIAGNOSIS — M340 Progressive systemic sclerosis: Secondary | ICD-10-CM | POA: Diagnosis not present

## 2018-12-31 DIAGNOSIS — R262 Difficulty in walking, not elsewhere classified: Secondary | ICD-10-CM | POA: Diagnosis not present

## 2018-12-31 DIAGNOSIS — M25671 Stiffness of right ankle, not elsewhere classified: Secondary | ICD-10-CM | POA: Diagnosis not present

## 2018-12-31 DIAGNOSIS — M62551 Muscle wasting and atrophy, not elsewhere classified, right thigh: Secondary | ICD-10-CM | POA: Diagnosis not present

## 2018-12-31 DIAGNOSIS — M6281 Muscle weakness (generalized): Secondary | ICD-10-CM | POA: Diagnosis not present

## 2018-12-31 DIAGNOSIS — F0281 Dementia in other diseases classified elsewhere with behavioral disturbance: Secondary | ICD-10-CM | POA: Diagnosis not present

## 2018-12-31 DIAGNOSIS — M25674 Stiffness of right foot, not elsewhere classified: Secondary | ICD-10-CM | POA: Diagnosis not present

## 2018-12-31 DIAGNOSIS — M25675 Stiffness of left foot, not elsewhere classified: Secondary | ICD-10-CM | POA: Diagnosis not present

## 2019-01-03 DIAGNOSIS — M62551 Muscle wasting and atrophy, not elsewhere classified, right thigh: Secondary | ICD-10-CM | POA: Diagnosis not present

## 2019-01-03 DIAGNOSIS — M25675 Stiffness of left foot, not elsewhere classified: Secondary | ICD-10-CM | POA: Diagnosis not present

## 2019-01-03 DIAGNOSIS — R262 Difficulty in walking, not elsewhere classified: Secondary | ICD-10-CM | POA: Diagnosis not present

## 2019-01-03 DIAGNOSIS — M25674 Stiffness of right foot, not elsewhere classified: Secondary | ICD-10-CM | POA: Diagnosis not present

## 2019-01-03 DIAGNOSIS — M6281 Muscle weakness (generalized): Secondary | ICD-10-CM | POA: Diagnosis not present

## 2019-01-03 DIAGNOSIS — M25672 Stiffness of left ankle, not elsewhere classified: Secondary | ICD-10-CM | POA: Diagnosis not present

## 2019-01-03 DIAGNOSIS — M62552 Muscle wasting and atrophy, not elsewhere classified, left thigh: Secondary | ICD-10-CM | POA: Diagnosis not present

## 2019-01-03 DIAGNOSIS — R269 Unspecified abnormalities of gait and mobility: Secondary | ICD-10-CM | POA: Diagnosis not present

## 2019-01-03 DIAGNOSIS — M25671 Stiffness of right ankle, not elsewhere classified: Secondary | ICD-10-CM | POA: Diagnosis not present

## 2019-01-04 DIAGNOSIS — E871 Hypo-osmolality and hyponatremia: Secondary | ICD-10-CM | POA: Diagnosis not present

## 2019-01-04 DIAGNOSIS — I10 Essential (primary) hypertension: Secondary | ICD-10-CM | POA: Diagnosis not present

## 2019-01-04 DIAGNOSIS — F0281 Dementia in other diseases classified elsewhere with behavioral disturbance: Secondary | ICD-10-CM | POA: Diagnosis not present

## 2019-01-04 DIAGNOSIS — E782 Mixed hyperlipidemia: Secondary | ICD-10-CM | POA: Diagnosis not present

## 2019-01-05 DIAGNOSIS — M6281 Muscle weakness (generalized): Secondary | ICD-10-CM | POA: Diagnosis not present

## 2019-01-05 DIAGNOSIS — R269 Unspecified abnormalities of gait and mobility: Secondary | ICD-10-CM | POA: Diagnosis not present

## 2019-01-05 DIAGNOSIS — M25671 Stiffness of right ankle, not elsewhere classified: Secondary | ICD-10-CM | POA: Diagnosis not present

## 2019-01-05 DIAGNOSIS — M25675 Stiffness of left foot, not elsewhere classified: Secondary | ICD-10-CM | POA: Diagnosis not present

## 2019-01-05 DIAGNOSIS — M62551 Muscle wasting and atrophy, not elsewhere classified, right thigh: Secondary | ICD-10-CM | POA: Diagnosis not present

## 2019-01-05 DIAGNOSIS — R262 Difficulty in walking, not elsewhere classified: Secondary | ICD-10-CM | POA: Diagnosis not present

## 2019-01-05 DIAGNOSIS — M25674 Stiffness of right foot, not elsewhere classified: Secondary | ICD-10-CM | POA: Diagnosis not present

## 2019-01-05 DIAGNOSIS — M25672 Stiffness of left ankle, not elsewhere classified: Secondary | ICD-10-CM | POA: Diagnosis not present

## 2019-01-05 DIAGNOSIS — M62552 Muscle wasting and atrophy, not elsewhere classified, left thigh: Secondary | ICD-10-CM | POA: Diagnosis not present

## 2019-01-10 DIAGNOSIS — M6281 Muscle weakness (generalized): Secondary | ICD-10-CM | POA: Diagnosis not present

## 2019-01-10 DIAGNOSIS — Z9181 History of falling: Secondary | ICD-10-CM | POA: Diagnosis not present

## 2019-01-10 DIAGNOSIS — R262 Difficulty in walking, not elsewhere classified: Secondary | ICD-10-CM | POA: Diagnosis not present

## 2019-01-10 DIAGNOSIS — M25512 Pain in left shoulder: Secondary | ICD-10-CM | POA: Diagnosis not present

## 2019-01-10 DIAGNOSIS — M2560 Stiffness of unspecified joint, not elsewhere classified: Secondary | ICD-10-CM | POA: Diagnosis not present

## 2019-01-10 DIAGNOSIS — R269 Unspecified abnormalities of gait and mobility: Secondary | ICD-10-CM | POA: Diagnosis not present

## 2019-01-10 DIAGNOSIS — M25511 Pain in right shoulder: Secondary | ICD-10-CM | POA: Diagnosis not present

## 2019-01-11 DIAGNOSIS — M1712 Unilateral primary osteoarthritis, left knee: Secondary | ICD-10-CM | POA: Diagnosis not present

## 2019-01-11 DIAGNOSIS — M75101 Unspecified rotator cuff tear or rupture of right shoulder, not specified as traumatic: Secondary | ICD-10-CM | POA: Diagnosis not present

## 2019-01-12 DIAGNOSIS — R262 Difficulty in walking, not elsewhere classified: Secondary | ICD-10-CM | POA: Diagnosis not present

## 2019-01-12 DIAGNOSIS — Z9181 History of falling: Secondary | ICD-10-CM | POA: Diagnosis not present

## 2019-01-12 DIAGNOSIS — R269 Unspecified abnormalities of gait and mobility: Secondary | ICD-10-CM | POA: Diagnosis not present

## 2019-01-12 DIAGNOSIS — M25511 Pain in right shoulder: Secondary | ICD-10-CM | POA: Diagnosis not present

## 2019-01-12 DIAGNOSIS — M6281 Muscle weakness (generalized): Secondary | ICD-10-CM | POA: Diagnosis not present

## 2019-01-12 DIAGNOSIS — M25512 Pain in left shoulder: Secondary | ICD-10-CM | POA: Diagnosis not present

## 2019-01-12 DIAGNOSIS — M2560 Stiffness of unspecified joint, not elsewhere classified: Secondary | ICD-10-CM | POA: Diagnosis not present

## 2019-01-14 DIAGNOSIS — M2560 Stiffness of unspecified joint, not elsewhere classified: Secondary | ICD-10-CM | POA: Diagnosis not present

## 2019-01-14 DIAGNOSIS — M6281 Muscle weakness (generalized): Secondary | ICD-10-CM | POA: Diagnosis not present

## 2019-01-14 DIAGNOSIS — R269 Unspecified abnormalities of gait and mobility: Secondary | ICD-10-CM | POA: Diagnosis not present

## 2019-01-14 DIAGNOSIS — M25512 Pain in left shoulder: Secondary | ICD-10-CM | POA: Diagnosis not present

## 2019-01-14 DIAGNOSIS — Z9181 History of falling: Secondary | ICD-10-CM | POA: Diagnosis not present

## 2019-01-14 DIAGNOSIS — M25511 Pain in right shoulder: Secondary | ICD-10-CM | POA: Diagnosis not present

## 2019-01-14 DIAGNOSIS — R262 Difficulty in walking, not elsewhere classified: Secondary | ICD-10-CM | POA: Diagnosis not present

## 2019-01-17 DIAGNOSIS — M25511 Pain in right shoulder: Secondary | ICD-10-CM | POA: Diagnosis not present

## 2019-01-17 DIAGNOSIS — Z9181 History of falling: Secondary | ICD-10-CM | POA: Diagnosis not present

## 2019-01-17 DIAGNOSIS — R269 Unspecified abnormalities of gait and mobility: Secondary | ICD-10-CM | POA: Diagnosis not present

## 2019-01-17 DIAGNOSIS — M25512 Pain in left shoulder: Secondary | ICD-10-CM | POA: Diagnosis not present

## 2019-01-17 DIAGNOSIS — M6281 Muscle weakness (generalized): Secondary | ICD-10-CM | POA: Diagnosis not present

## 2019-01-17 DIAGNOSIS — M2560 Stiffness of unspecified joint, not elsewhere classified: Secondary | ICD-10-CM | POA: Diagnosis not present

## 2019-01-17 DIAGNOSIS — R262 Difficulty in walking, not elsewhere classified: Secondary | ICD-10-CM | POA: Diagnosis not present

## 2019-01-19 DIAGNOSIS — R269 Unspecified abnormalities of gait and mobility: Secondary | ICD-10-CM | POA: Diagnosis not present

## 2019-01-19 DIAGNOSIS — M2560 Stiffness of unspecified joint, not elsewhere classified: Secondary | ICD-10-CM | POA: Diagnosis not present

## 2019-01-19 DIAGNOSIS — M6281 Muscle weakness (generalized): Secondary | ICD-10-CM | POA: Diagnosis not present

## 2019-01-19 DIAGNOSIS — Z9181 History of falling: Secondary | ICD-10-CM | POA: Diagnosis not present

## 2019-01-19 DIAGNOSIS — M25511 Pain in right shoulder: Secondary | ICD-10-CM | POA: Diagnosis not present

## 2019-01-19 DIAGNOSIS — R262 Difficulty in walking, not elsewhere classified: Secondary | ICD-10-CM | POA: Diagnosis not present

## 2019-01-19 DIAGNOSIS — M25512 Pain in left shoulder: Secondary | ICD-10-CM | POA: Diagnosis not present

## 2019-01-21 DIAGNOSIS — Z9181 History of falling: Secondary | ICD-10-CM | POA: Diagnosis not present

## 2019-01-21 DIAGNOSIS — M6281 Muscle weakness (generalized): Secondary | ICD-10-CM | POA: Diagnosis not present

## 2019-01-21 DIAGNOSIS — R262 Difficulty in walking, not elsewhere classified: Secondary | ICD-10-CM | POA: Diagnosis not present

## 2019-01-21 DIAGNOSIS — M25511 Pain in right shoulder: Secondary | ICD-10-CM | POA: Diagnosis not present

## 2019-01-21 DIAGNOSIS — M2560 Stiffness of unspecified joint, not elsewhere classified: Secondary | ICD-10-CM | POA: Diagnosis not present

## 2019-01-21 DIAGNOSIS — M25512 Pain in left shoulder: Secondary | ICD-10-CM | POA: Diagnosis not present

## 2019-01-21 DIAGNOSIS — R269 Unspecified abnormalities of gait and mobility: Secondary | ICD-10-CM | POA: Diagnosis not present

## 2019-01-24 DIAGNOSIS — M25512 Pain in left shoulder: Secondary | ICD-10-CM | POA: Diagnosis not present

## 2019-01-24 DIAGNOSIS — M6281 Muscle weakness (generalized): Secondary | ICD-10-CM | POA: Diagnosis not present

## 2019-01-24 DIAGNOSIS — R262 Difficulty in walking, not elsewhere classified: Secondary | ICD-10-CM | POA: Diagnosis not present

## 2019-01-24 DIAGNOSIS — Z9181 History of falling: Secondary | ICD-10-CM | POA: Diagnosis not present

## 2019-01-24 DIAGNOSIS — R269 Unspecified abnormalities of gait and mobility: Secondary | ICD-10-CM | POA: Diagnosis not present

## 2019-01-24 DIAGNOSIS — M2560 Stiffness of unspecified joint, not elsewhere classified: Secondary | ICD-10-CM | POA: Diagnosis not present

## 2019-01-24 DIAGNOSIS — M25511 Pain in right shoulder: Secondary | ICD-10-CM | POA: Diagnosis not present

## 2019-01-26 DIAGNOSIS — R262 Difficulty in walking, not elsewhere classified: Secondary | ICD-10-CM | POA: Diagnosis not present

## 2019-01-26 DIAGNOSIS — M2560 Stiffness of unspecified joint, not elsewhere classified: Secondary | ICD-10-CM | POA: Diagnosis not present

## 2019-01-26 DIAGNOSIS — M25512 Pain in left shoulder: Secondary | ICD-10-CM | POA: Diagnosis not present

## 2019-01-26 DIAGNOSIS — Z9181 History of falling: Secondary | ICD-10-CM | POA: Diagnosis not present

## 2019-01-26 DIAGNOSIS — R269 Unspecified abnormalities of gait and mobility: Secondary | ICD-10-CM | POA: Diagnosis not present

## 2019-01-26 DIAGNOSIS — M25511 Pain in right shoulder: Secondary | ICD-10-CM | POA: Diagnosis not present

## 2019-01-26 DIAGNOSIS — M6281 Muscle weakness (generalized): Secondary | ICD-10-CM | POA: Diagnosis not present

## 2019-01-31 DIAGNOSIS — M25511 Pain in right shoulder: Secondary | ICD-10-CM | POA: Diagnosis not present

## 2019-01-31 DIAGNOSIS — M2560 Stiffness of unspecified joint, not elsewhere classified: Secondary | ICD-10-CM | POA: Diagnosis not present

## 2019-01-31 DIAGNOSIS — Z9181 History of falling: Secondary | ICD-10-CM | POA: Diagnosis not present

## 2019-01-31 DIAGNOSIS — R269 Unspecified abnormalities of gait and mobility: Secondary | ICD-10-CM | POA: Diagnosis not present

## 2019-01-31 DIAGNOSIS — R262 Difficulty in walking, not elsewhere classified: Secondary | ICD-10-CM | POA: Diagnosis not present

## 2019-01-31 DIAGNOSIS — M6281 Muscle weakness (generalized): Secondary | ICD-10-CM | POA: Diagnosis not present

## 2019-01-31 DIAGNOSIS — M25512 Pain in left shoulder: Secondary | ICD-10-CM | POA: Diagnosis not present

## 2019-02-02 DIAGNOSIS — M6281 Muscle weakness (generalized): Secondary | ICD-10-CM | POA: Diagnosis not present

## 2019-02-02 DIAGNOSIS — R269 Unspecified abnormalities of gait and mobility: Secondary | ICD-10-CM | POA: Diagnosis not present

## 2019-02-02 DIAGNOSIS — Z9181 History of falling: Secondary | ICD-10-CM | POA: Diagnosis not present

## 2019-02-02 DIAGNOSIS — M2560 Stiffness of unspecified joint, not elsewhere classified: Secondary | ICD-10-CM | POA: Diagnosis not present

## 2019-02-02 DIAGNOSIS — M25512 Pain in left shoulder: Secondary | ICD-10-CM | POA: Diagnosis not present

## 2019-02-02 DIAGNOSIS — R262 Difficulty in walking, not elsewhere classified: Secondary | ICD-10-CM | POA: Diagnosis not present

## 2019-02-02 DIAGNOSIS — M25511 Pain in right shoulder: Secondary | ICD-10-CM | POA: Diagnosis not present

## 2019-02-04 DIAGNOSIS — Z9181 History of falling: Secondary | ICD-10-CM | POA: Diagnosis not present

## 2019-02-04 DIAGNOSIS — M25511 Pain in right shoulder: Secondary | ICD-10-CM | POA: Diagnosis not present

## 2019-02-04 DIAGNOSIS — R262 Difficulty in walking, not elsewhere classified: Secondary | ICD-10-CM | POA: Diagnosis not present

## 2019-02-04 DIAGNOSIS — M25512 Pain in left shoulder: Secondary | ICD-10-CM | POA: Diagnosis not present

## 2019-02-07 DIAGNOSIS — M6281 Muscle weakness (generalized): Secondary | ICD-10-CM | POA: Diagnosis not present

## 2019-02-07 DIAGNOSIS — M25511 Pain in right shoulder: Secondary | ICD-10-CM | POA: Diagnosis not present

## 2019-02-07 DIAGNOSIS — M2560 Stiffness of unspecified joint, not elsewhere classified: Secondary | ICD-10-CM | POA: Diagnosis not present

## 2019-02-07 DIAGNOSIS — M25512 Pain in left shoulder: Secondary | ICD-10-CM | POA: Diagnosis not present

## 2019-02-07 DIAGNOSIS — R269 Unspecified abnormalities of gait and mobility: Secondary | ICD-10-CM | POA: Diagnosis not present

## 2019-02-07 DIAGNOSIS — R262 Difficulty in walking, not elsewhere classified: Secondary | ICD-10-CM | POA: Diagnosis not present

## 2019-02-07 DIAGNOSIS — Z9181 History of falling: Secondary | ICD-10-CM | POA: Diagnosis not present

## 2019-02-08 DIAGNOSIS — Z0000 Encounter for general adult medical examination without abnormal findings: Secondary | ICD-10-CM | POA: Diagnosis not present

## 2019-02-09 DIAGNOSIS — M25512 Pain in left shoulder: Secondary | ICD-10-CM | POA: Diagnosis not present

## 2019-02-09 DIAGNOSIS — R262 Difficulty in walking, not elsewhere classified: Secondary | ICD-10-CM | POA: Diagnosis not present

## 2019-02-09 DIAGNOSIS — Z9181 History of falling: Secondary | ICD-10-CM | POA: Diagnosis not present

## 2019-02-09 DIAGNOSIS — M25511 Pain in right shoulder: Secondary | ICD-10-CM | POA: Diagnosis not present

## 2019-02-11 DIAGNOSIS — Z9181 History of falling: Secondary | ICD-10-CM | POA: Diagnosis not present

## 2019-02-11 DIAGNOSIS — M25512 Pain in left shoulder: Secondary | ICD-10-CM | POA: Diagnosis not present

## 2019-02-11 DIAGNOSIS — R262 Difficulty in walking, not elsewhere classified: Secondary | ICD-10-CM | POA: Diagnosis not present

## 2019-02-11 DIAGNOSIS — M25511 Pain in right shoulder: Secondary | ICD-10-CM | POA: Diagnosis not present

## 2019-02-14 DIAGNOSIS — M25511 Pain in right shoulder: Secondary | ICD-10-CM | POA: Diagnosis not present

## 2019-02-14 DIAGNOSIS — Z9181 History of falling: Secondary | ICD-10-CM | POA: Diagnosis not present

## 2019-02-14 DIAGNOSIS — R262 Difficulty in walking, not elsewhere classified: Secondary | ICD-10-CM | POA: Diagnosis not present

## 2019-02-14 DIAGNOSIS — M25512 Pain in left shoulder: Secondary | ICD-10-CM | POA: Diagnosis not present

## 2019-02-18 DIAGNOSIS — M6281 Muscle weakness (generalized): Secondary | ICD-10-CM | POA: Diagnosis not present

## 2019-02-18 DIAGNOSIS — M25512 Pain in left shoulder: Secondary | ICD-10-CM | POA: Diagnosis not present

## 2019-02-18 DIAGNOSIS — R269 Unspecified abnormalities of gait and mobility: Secondary | ICD-10-CM | POA: Diagnosis not present

## 2019-02-18 DIAGNOSIS — R262 Difficulty in walking, not elsewhere classified: Secondary | ICD-10-CM | POA: Diagnosis not present

## 2019-02-18 DIAGNOSIS — M2560 Stiffness of unspecified joint, not elsewhere classified: Secondary | ICD-10-CM | POA: Diagnosis not present

## 2019-02-18 DIAGNOSIS — Z9181 History of falling: Secondary | ICD-10-CM | POA: Diagnosis not present

## 2019-02-18 DIAGNOSIS — M25511 Pain in right shoulder: Secondary | ICD-10-CM | POA: Diagnosis not present

## 2019-02-21 DIAGNOSIS — M25511 Pain in right shoulder: Secondary | ICD-10-CM | POA: Diagnosis not present

## 2019-02-21 DIAGNOSIS — R262 Difficulty in walking, not elsewhere classified: Secondary | ICD-10-CM | POA: Diagnosis not present

## 2019-02-21 DIAGNOSIS — M25512 Pain in left shoulder: Secondary | ICD-10-CM | POA: Diagnosis not present

## 2019-02-21 DIAGNOSIS — Z9181 History of falling: Secondary | ICD-10-CM | POA: Diagnosis not present

## 2019-02-21 DIAGNOSIS — M6281 Muscle weakness (generalized): Secondary | ICD-10-CM | POA: Diagnosis not present

## 2019-02-21 DIAGNOSIS — R269 Unspecified abnormalities of gait and mobility: Secondary | ICD-10-CM | POA: Diagnosis not present

## 2019-02-21 DIAGNOSIS — M2560 Stiffness of unspecified joint, not elsewhere classified: Secondary | ICD-10-CM | POA: Diagnosis not present

## 2019-02-23 DIAGNOSIS — M25511 Pain in right shoulder: Secondary | ICD-10-CM | POA: Diagnosis not present

## 2019-02-23 DIAGNOSIS — M6281 Muscle weakness (generalized): Secondary | ICD-10-CM | POA: Diagnosis not present

## 2019-02-23 DIAGNOSIS — R262 Difficulty in walking, not elsewhere classified: Secondary | ICD-10-CM | POA: Diagnosis not present

## 2019-02-23 DIAGNOSIS — R269 Unspecified abnormalities of gait and mobility: Secondary | ICD-10-CM | POA: Diagnosis not present

## 2019-02-23 DIAGNOSIS — M2560 Stiffness of unspecified joint, not elsewhere classified: Secondary | ICD-10-CM | POA: Diagnosis not present

## 2019-02-23 DIAGNOSIS — M25512 Pain in left shoulder: Secondary | ICD-10-CM | POA: Diagnosis not present

## 2019-02-23 DIAGNOSIS — Z9181 History of falling: Secondary | ICD-10-CM | POA: Diagnosis not present

## 2019-02-25 DIAGNOSIS — R262 Difficulty in walking, not elsewhere classified: Secondary | ICD-10-CM | POA: Diagnosis not present

## 2019-02-25 DIAGNOSIS — M25511 Pain in right shoulder: Secondary | ICD-10-CM | POA: Diagnosis not present

## 2019-02-25 DIAGNOSIS — M25512 Pain in left shoulder: Secondary | ICD-10-CM | POA: Diagnosis not present

## 2019-02-25 DIAGNOSIS — Z9181 History of falling: Secondary | ICD-10-CM | POA: Diagnosis not present

## 2019-02-28 IMAGING — DX DG FOOT COMPLETE 3+V*R*
3 series · 3 of 3 positions shown · non-contrast
Comparison: None.

CLINICAL DATA: Postop hammertoe surgery of the right foot

EXAM:
RIGHT FOOT COMPLETE - 3+ VIEW

[foot ap]
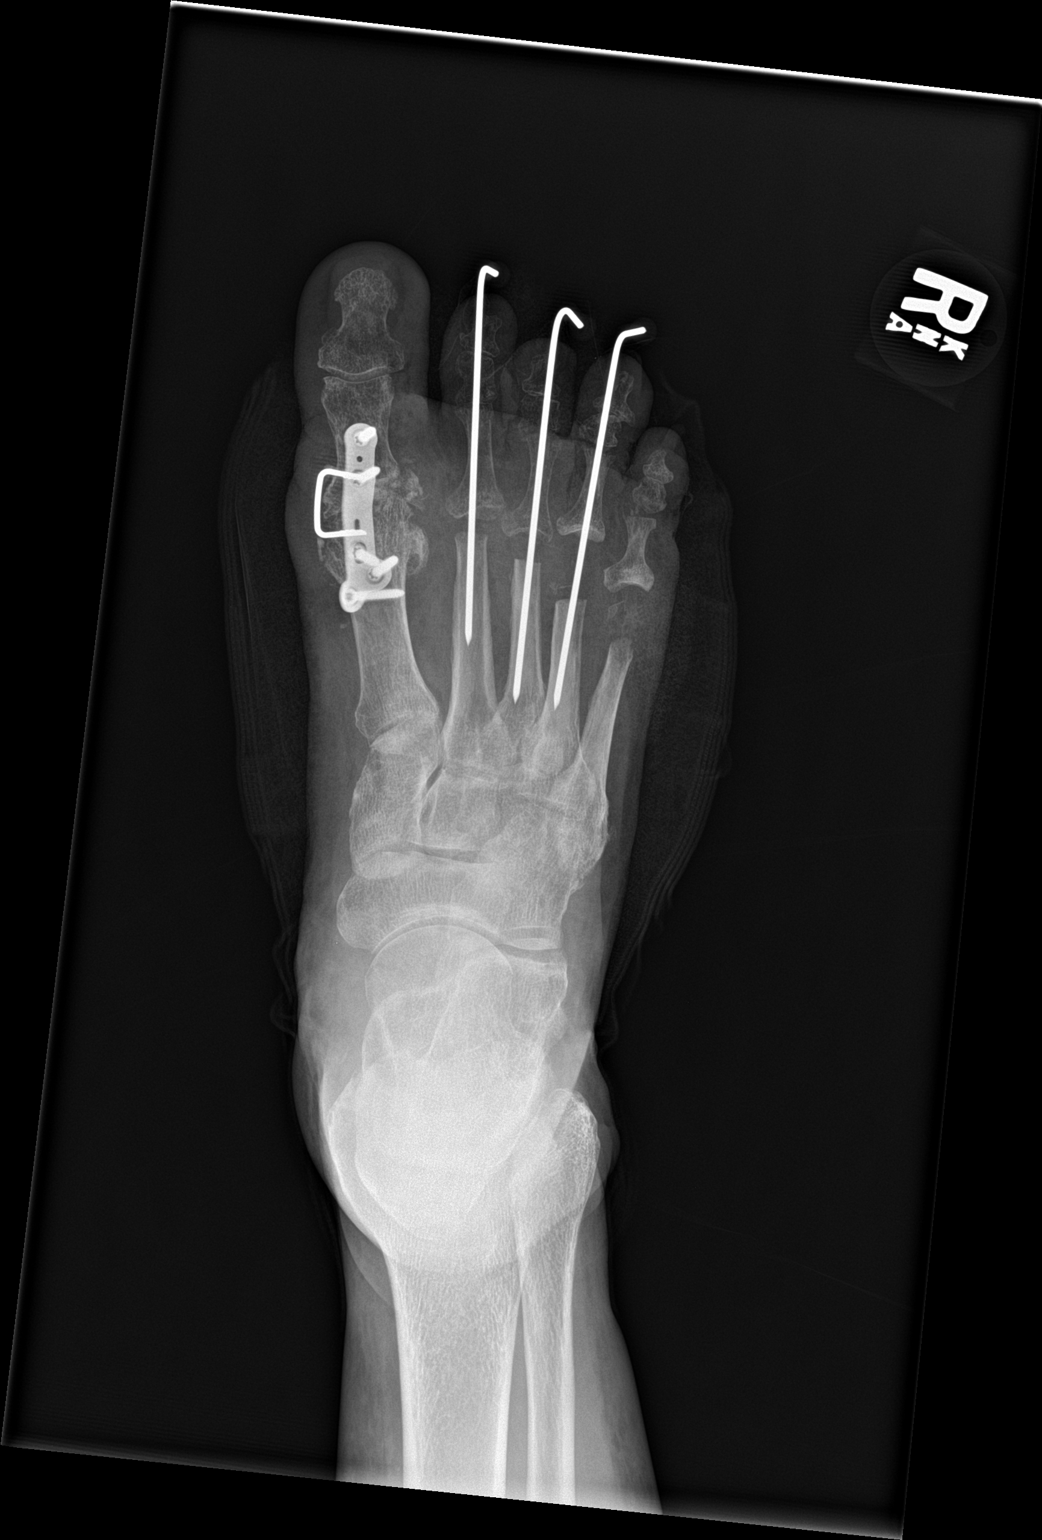

[foot obl]
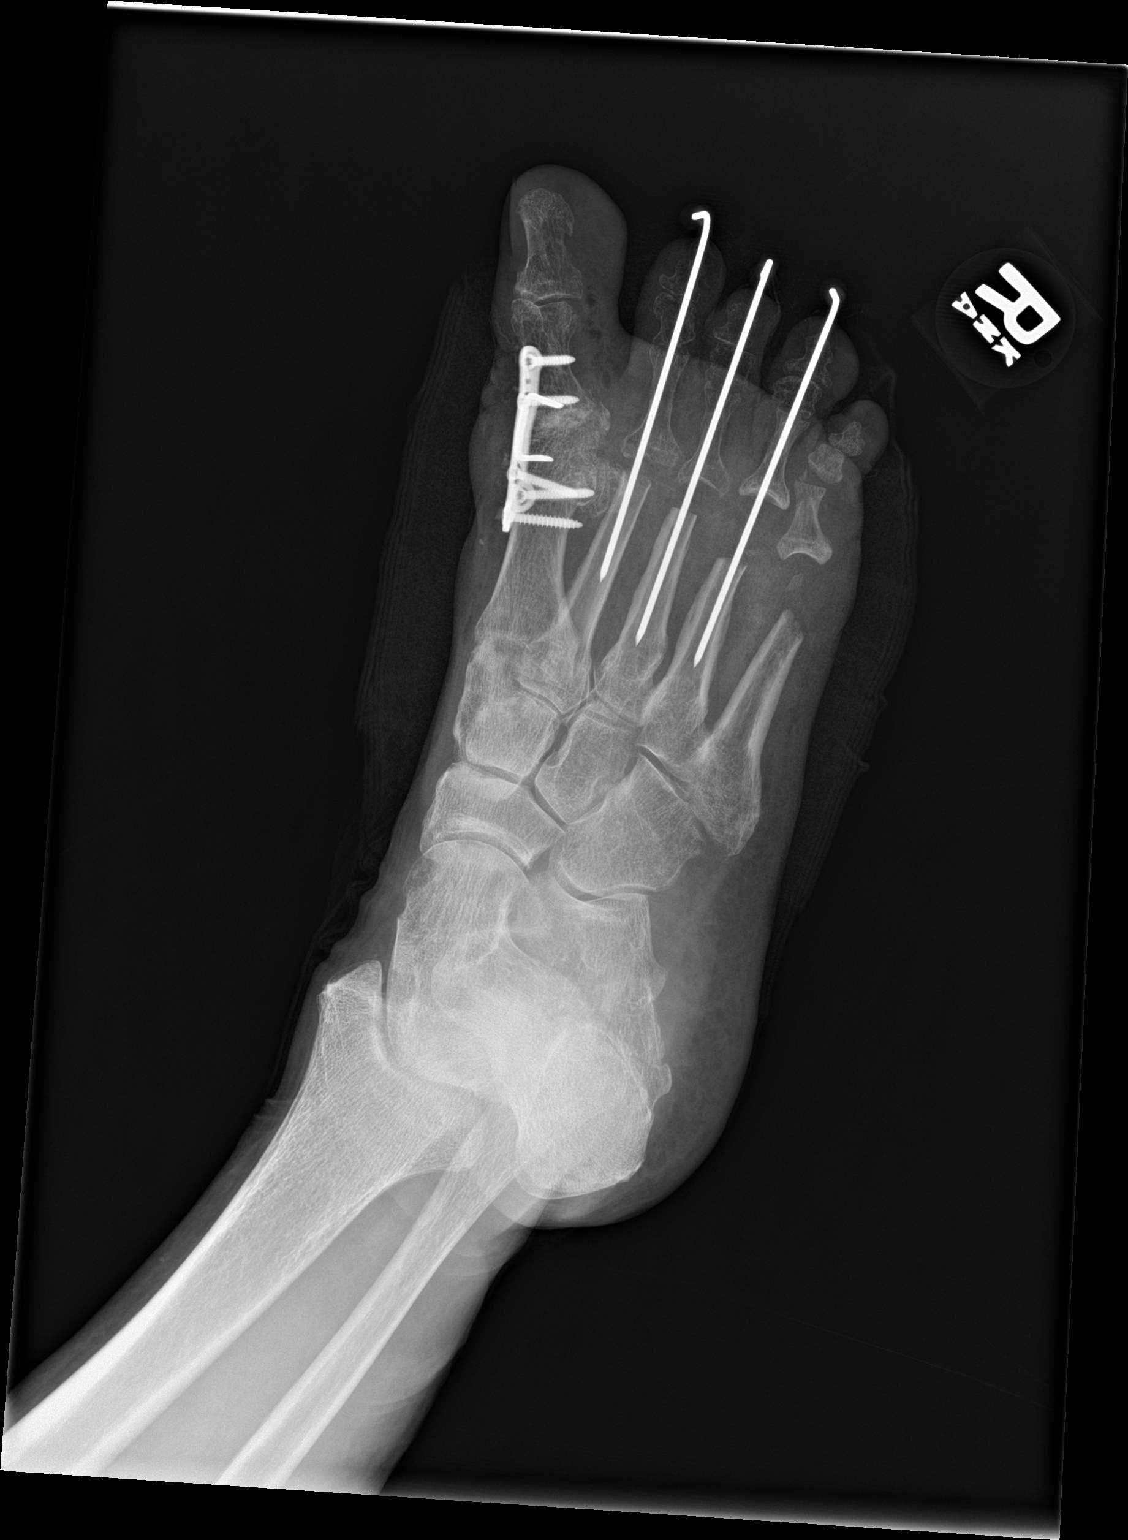

[foot lat]
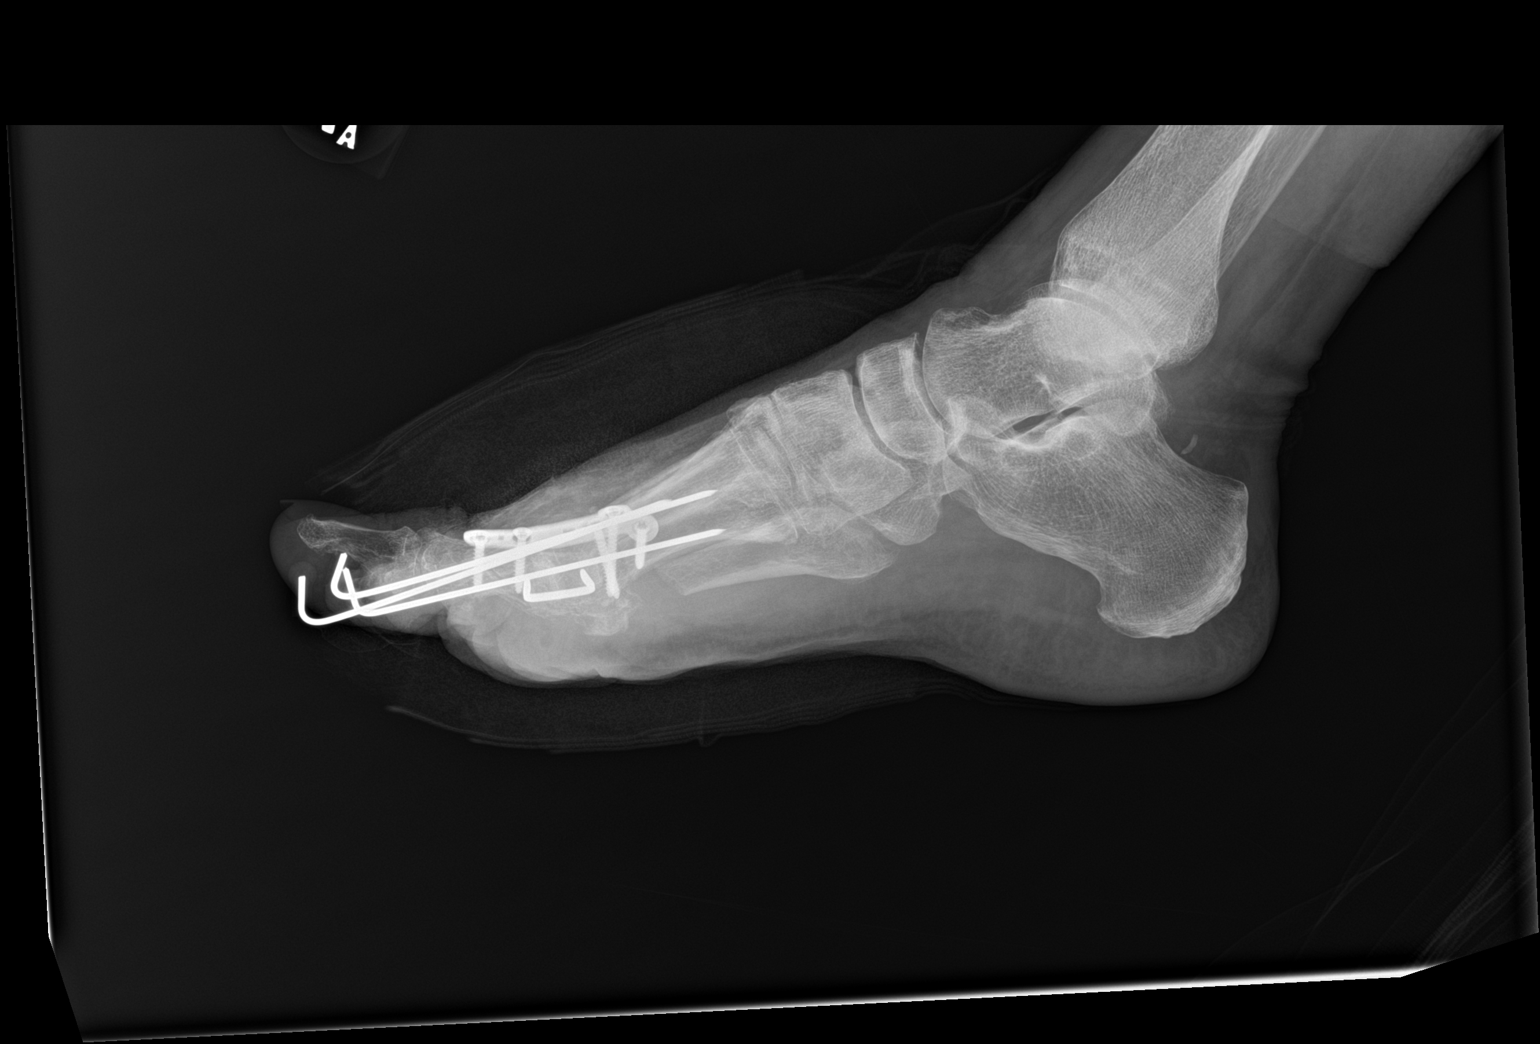

[3 of 3 positions shown; findings below may reference images not displayed]

FINDINGS: Plate and screw fixation of the right first MTP joint is present for
fusion. K-wires have been inserted through the right second, third,
and fourth toes into the proximal metatarsals for fixation. There is
absence of the head of the right fifth metatarsal, probably
postoperative, but osteomyelitis cannot be excluded. Correlate
clinically. No other abnormality is seen.
IMPRESSION: 1. Plate and screw fixation for fusion of the right first MTP joint.
2. K-wires placed for stabilization of the right second, third, and
fourth toes and proximal metatarsals with amputation of the distal
second, third, and fourth metatarsals.
3. Amputation of the distal right fifth metatarsal. Cannot exclude
osteomyelitis. Correlate clinically.

## 2019-03-02 DIAGNOSIS — M25511 Pain in right shoulder: Secondary | ICD-10-CM | POA: Diagnosis not present

## 2019-03-02 DIAGNOSIS — Z9181 History of falling: Secondary | ICD-10-CM | POA: Diagnosis not present

## 2019-03-02 DIAGNOSIS — R262 Difficulty in walking, not elsewhere classified: Secondary | ICD-10-CM | POA: Diagnosis not present

## 2019-03-02 DIAGNOSIS — M25512 Pain in left shoulder: Secondary | ICD-10-CM | POA: Diagnosis not present

## 2019-03-04 DIAGNOSIS — M25512 Pain in left shoulder: Secondary | ICD-10-CM | POA: Diagnosis not present

## 2019-03-04 DIAGNOSIS — Z9181 History of falling: Secondary | ICD-10-CM | POA: Diagnosis not present

## 2019-03-04 DIAGNOSIS — R262 Difficulty in walking, not elsewhere classified: Secondary | ICD-10-CM | POA: Diagnosis not present

## 2019-03-04 DIAGNOSIS — M25511 Pain in right shoulder: Secondary | ICD-10-CM | POA: Diagnosis not present

## 2019-03-07 DIAGNOSIS — M25512 Pain in left shoulder: Secondary | ICD-10-CM | POA: Diagnosis not present

## 2019-03-07 DIAGNOSIS — R262 Difficulty in walking, not elsewhere classified: Secondary | ICD-10-CM | POA: Diagnosis not present

## 2019-03-07 DIAGNOSIS — Z9181 History of falling: Secondary | ICD-10-CM | POA: Diagnosis not present

## 2019-03-07 DIAGNOSIS — M25511 Pain in right shoulder: Secondary | ICD-10-CM | POA: Diagnosis not present

## 2019-03-08 DIAGNOSIS — M75101 Unspecified rotator cuff tear or rupture of right shoulder, not specified as traumatic: Secondary | ICD-10-CM | POA: Diagnosis not present

## 2019-03-08 DIAGNOSIS — M1712 Unilateral primary osteoarthritis, left knee: Secondary | ICD-10-CM | POA: Diagnosis not present

## 2019-03-08 DIAGNOSIS — Z6825 Body mass index (BMI) 25.0-25.9, adult: Secondary | ICD-10-CM | POA: Diagnosis not present

## 2019-03-08 DIAGNOSIS — F0281 Dementia in other diseases classified elsewhere with behavioral disturbance: Secondary | ICD-10-CM | POA: Diagnosis not present

## 2019-03-08 DIAGNOSIS — M1711 Unilateral primary osteoarthritis, right knee: Secondary | ICD-10-CM | POA: Diagnosis not present

## 2019-03-09 DIAGNOSIS — R262 Difficulty in walking, not elsewhere classified: Secondary | ICD-10-CM | POA: Diagnosis not present

## 2019-03-09 DIAGNOSIS — M25512 Pain in left shoulder: Secondary | ICD-10-CM | POA: Diagnosis not present

## 2019-03-09 DIAGNOSIS — Z9181 History of falling: Secondary | ICD-10-CM | POA: Diagnosis not present

## 2019-03-09 DIAGNOSIS — M25511 Pain in right shoulder: Secondary | ICD-10-CM | POA: Diagnosis not present

## 2019-03-14 DIAGNOSIS — R262 Difficulty in walking, not elsewhere classified: Secondary | ICD-10-CM | POA: Diagnosis not present

## 2019-03-14 DIAGNOSIS — M25512 Pain in left shoulder: Secondary | ICD-10-CM | POA: Diagnosis not present

## 2019-03-14 DIAGNOSIS — M25511 Pain in right shoulder: Secondary | ICD-10-CM | POA: Diagnosis not present

## 2019-03-14 DIAGNOSIS — Z9181 History of falling: Secondary | ICD-10-CM | POA: Diagnosis not present

## 2019-03-16 DIAGNOSIS — M25511 Pain in right shoulder: Secondary | ICD-10-CM | POA: Diagnosis not present

## 2019-03-16 DIAGNOSIS — R262 Difficulty in walking, not elsewhere classified: Secondary | ICD-10-CM | POA: Diagnosis not present

## 2019-03-16 DIAGNOSIS — M25512 Pain in left shoulder: Secondary | ICD-10-CM | POA: Diagnosis not present

## 2019-03-16 DIAGNOSIS — Z9181 History of falling: Secondary | ICD-10-CM | POA: Diagnosis not present

## 2019-03-18 DIAGNOSIS — R262 Difficulty in walking, not elsewhere classified: Secondary | ICD-10-CM | POA: Diagnosis not present

## 2019-03-18 DIAGNOSIS — M25512 Pain in left shoulder: Secondary | ICD-10-CM | POA: Diagnosis not present

## 2019-03-18 DIAGNOSIS — M25511 Pain in right shoulder: Secondary | ICD-10-CM | POA: Diagnosis not present

## 2019-03-18 DIAGNOSIS — Z9181 History of falling: Secondary | ICD-10-CM | POA: Diagnosis not present

## 2019-03-21 DIAGNOSIS — Z9181 History of falling: Secondary | ICD-10-CM | POA: Diagnosis not present

## 2019-03-21 DIAGNOSIS — M25512 Pain in left shoulder: Secondary | ICD-10-CM | POA: Diagnosis not present

## 2019-03-21 DIAGNOSIS — R262 Difficulty in walking, not elsewhere classified: Secondary | ICD-10-CM | POA: Diagnosis not present

## 2019-03-21 DIAGNOSIS — M25511 Pain in right shoulder: Secondary | ICD-10-CM | POA: Diagnosis not present

## 2019-03-23 DIAGNOSIS — M25511 Pain in right shoulder: Secondary | ICD-10-CM | POA: Diagnosis not present

## 2019-03-23 DIAGNOSIS — M25512 Pain in left shoulder: Secondary | ICD-10-CM | POA: Diagnosis not present

## 2019-03-23 DIAGNOSIS — R262 Difficulty in walking, not elsewhere classified: Secondary | ICD-10-CM | POA: Diagnosis not present

## 2019-03-23 DIAGNOSIS — Z9181 History of falling: Secondary | ICD-10-CM | POA: Diagnosis not present

## 2019-03-25 DIAGNOSIS — M25512 Pain in left shoulder: Secondary | ICD-10-CM | POA: Diagnosis not present

## 2019-03-25 DIAGNOSIS — M25511 Pain in right shoulder: Secondary | ICD-10-CM | POA: Diagnosis not present

## 2019-03-25 DIAGNOSIS — Z9181 History of falling: Secondary | ICD-10-CM | POA: Diagnosis not present

## 2019-03-25 DIAGNOSIS — R262 Difficulty in walking, not elsewhere classified: Secondary | ICD-10-CM | POA: Diagnosis not present

## 2019-03-30 DIAGNOSIS — R262 Difficulty in walking, not elsewhere classified: Secondary | ICD-10-CM | POA: Diagnosis not present

## 2019-03-30 DIAGNOSIS — Z9181 History of falling: Secondary | ICD-10-CM | POA: Diagnosis not present

## 2019-03-30 DIAGNOSIS — M25511 Pain in right shoulder: Secondary | ICD-10-CM | POA: Diagnosis not present

## 2019-03-30 DIAGNOSIS — M25512 Pain in left shoulder: Secondary | ICD-10-CM | POA: Diagnosis not present

## 2019-04-05 DIAGNOSIS — R262 Difficulty in walking, not elsewhere classified: Secondary | ICD-10-CM | POA: Diagnosis not present

## 2019-04-05 DIAGNOSIS — M25512 Pain in left shoulder: Secondary | ICD-10-CM | POA: Diagnosis not present

## 2019-04-05 DIAGNOSIS — Z9181 History of falling: Secondary | ICD-10-CM | POA: Diagnosis not present

## 2019-04-05 DIAGNOSIS — M25511 Pain in right shoulder: Secondary | ICD-10-CM | POA: Diagnosis not present

## 2019-04-07 ENCOUNTER — Telehealth: Payer: Self-pay | Admitting: Podiatry

## 2019-04-07 NOTE — Telephone Encounter (Signed)
Called pt to discuss shoes that were reordered for her in the [place] office.  Pt stated that the shoes she had with the brace are working fine with the brace now. She said she had some swelling previously and that is why the shoes were not fitting in April. But she is fine now and will call if needed.

## 2019-04-11 DIAGNOSIS — Z9181 History of falling: Secondary | ICD-10-CM | POA: Diagnosis not present

## 2019-04-11 DIAGNOSIS — M25512 Pain in left shoulder: Secondary | ICD-10-CM | POA: Diagnosis not present

## 2019-04-11 DIAGNOSIS — R262 Difficulty in walking, not elsewhere classified: Secondary | ICD-10-CM | POA: Diagnosis not present

## 2019-04-11 DIAGNOSIS — M25511 Pain in right shoulder: Secondary | ICD-10-CM | POA: Diagnosis not present

## 2019-04-15 DIAGNOSIS — M25512 Pain in left shoulder: Secondary | ICD-10-CM | POA: Diagnosis not present

## 2019-04-15 DIAGNOSIS — R262 Difficulty in walking, not elsewhere classified: Secondary | ICD-10-CM | POA: Diagnosis not present

## 2019-04-15 DIAGNOSIS — Z9181 History of falling: Secondary | ICD-10-CM | POA: Diagnosis not present

## 2019-04-15 DIAGNOSIS — M25511 Pain in right shoulder: Secondary | ICD-10-CM | POA: Diagnosis not present

## 2019-04-18 DIAGNOSIS — M25512 Pain in left shoulder: Secondary | ICD-10-CM | POA: Diagnosis not present

## 2019-04-18 DIAGNOSIS — R262 Difficulty in walking, not elsewhere classified: Secondary | ICD-10-CM | POA: Diagnosis not present

## 2019-04-18 DIAGNOSIS — Z9181 History of falling: Secondary | ICD-10-CM | POA: Diagnosis not present

## 2019-04-18 DIAGNOSIS — M25511 Pain in right shoulder: Secondary | ICD-10-CM | POA: Diagnosis not present

## 2019-04-25 DIAGNOSIS — M25511 Pain in right shoulder: Secondary | ICD-10-CM | POA: Diagnosis not present

## 2019-04-25 DIAGNOSIS — M25512 Pain in left shoulder: Secondary | ICD-10-CM | POA: Diagnosis not present

## 2019-04-25 DIAGNOSIS — R262 Difficulty in walking, not elsewhere classified: Secondary | ICD-10-CM | POA: Diagnosis not present

## 2019-04-25 DIAGNOSIS — Z9181 History of falling: Secondary | ICD-10-CM | POA: Diagnosis not present

## 2019-05-02 DIAGNOSIS — M25511 Pain in right shoulder: Secondary | ICD-10-CM | POA: Diagnosis not present

## 2019-05-02 DIAGNOSIS — R262 Difficulty in walking, not elsewhere classified: Secondary | ICD-10-CM | POA: Diagnosis not present

## 2019-05-02 DIAGNOSIS — M25512 Pain in left shoulder: Secondary | ICD-10-CM | POA: Diagnosis not present

## 2019-05-02 DIAGNOSIS — Z9181 History of falling: Secondary | ICD-10-CM | POA: Diagnosis not present

## 2019-05-03 DIAGNOSIS — R05 Cough: Secondary | ICD-10-CM | POA: Diagnosis not present

## 2019-05-03 DIAGNOSIS — I4819 Other persistent atrial fibrillation: Secondary | ICD-10-CM | POA: Diagnosis not present

## 2019-05-03 DIAGNOSIS — I5031 Acute diastolic (congestive) heart failure: Secondary | ICD-10-CM | POA: Diagnosis not present

## 2019-05-03 DIAGNOSIS — K219 Gastro-esophageal reflux disease without esophagitis: Secondary | ICD-10-CM | POA: Diagnosis not present

## 2019-05-03 DIAGNOSIS — I10 Essential (primary) hypertension: Secondary | ICD-10-CM | POA: Diagnosis not present

## 2019-05-03 DIAGNOSIS — E782 Mixed hyperlipidemia: Secondary | ICD-10-CM | POA: Diagnosis not present

## 2019-05-03 DIAGNOSIS — Z1159 Encounter for screening for other viral diseases: Secondary | ICD-10-CM | POA: Diagnosis not present

## 2019-05-03 DIAGNOSIS — R0602 Shortness of breath: Secondary | ICD-10-CM | POA: Diagnosis not present

## 2019-05-03 DIAGNOSIS — R600 Localized edema: Secondary | ICD-10-CM | POA: Diagnosis not present

## 2019-05-03 DIAGNOSIS — Z6826 Body mass index (BMI) 26.0-26.9, adult: Secondary | ICD-10-CM | POA: Diagnosis not present

## 2019-05-03 DIAGNOSIS — F0281 Dementia in other diseases classified elsewhere with behavioral disturbance: Secondary | ICD-10-CM | POA: Diagnosis not present

## 2019-05-06 DIAGNOSIS — M25512 Pain in left shoulder: Secondary | ICD-10-CM | POA: Diagnosis not present

## 2019-05-06 DIAGNOSIS — M25511 Pain in right shoulder: Secondary | ICD-10-CM | POA: Diagnosis not present

## 2019-05-06 DIAGNOSIS — R262 Difficulty in walking, not elsewhere classified: Secondary | ICD-10-CM | POA: Diagnosis not present

## 2019-05-06 DIAGNOSIS — Z9181 History of falling: Secondary | ICD-10-CM | POA: Diagnosis not present

## 2019-05-11 DIAGNOSIS — M25512 Pain in left shoulder: Secondary | ICD-10-CM | POA: Diagnosis not present

## 2019-05-11 DIAGNOSIS — M25511 Pain in right shoulder: Secondary | ICD-10-CM | POA: Diagnosis not present

## 2019-05-11 DIAGNOSIS — R262 Difficulty in walking, not elsewhere classified: Secondary | ICD-10-CM | POA: Diagnosis not present

## 2019-05-11 DIAGNOSIS — Z9181 History of falling: Secondary | ICD-10-CM | POA: Diagnosis not present

## 2019-05-13 DIAGNOSIS — M25511 Pain in right shoulder: Secondary | ICD-10-CM | POA: Diagnosis not present

## 2019-05-13 DIAGNOSIS — R262 Difficulty in walking, not elsewhere classified: Secondary | ICD-10-CM | POA: Diagnosis not present

## 2019-05-13 DIAGNOSIS — Z9181 History of falling: Secondary | ICD-10-CM | POA: Diagnosis not present

## 2019-05-13 DIAGNOSIS — M25512 Pain in left shoulder: Secondary | ICD-10-CM | POA: Diagnosis not present

## 2019-05-16 DIAGNOSIS — Z9181 History of falling: Secondary | ICD-10-CM | POA: Diagnosis not present

## 2019-05-16 DIAGNOSIS — M25512 Pain in left shoulder: Secondary | ICD-10-CM | POA: Diagnosis not present

## 2019-05-16 DIAGNOSIS — M25511 Pain in right shoulder: Secondary | ICD-10-CM | POA: Diagnosis not present

## 2019-05-16 DIAGNOSIS — R262 Difficulty in walking, not elsewhere classified: Secondary | ICD-10-CM | POA: Diagnosis not present

## 2019-05-17 DIAGNOSIS — Z23 Encounter for immunization: Secondary | ICD-10-CM | POA: Diagnosis not present

## 2019-05-17 DIAGNOSIS — Z6827 Body mass index (BMI) 27.0-27.9, adult: Secondary | ICD-10-CM | POA: Diagnosis not present

## 2019-05-17 DIAGNOSIS — R600 Localized edema: Secondary | ICD-10-CM | POA: Diagnosis not present

## 2019-05-23 DIAGNOSIS — R262 Difficulty in walking, not elsewhere classified: Secondary | ICD-10-CM | POA: Diagnosis not present

## 2019-05-23 DIAGNOSIS — M25511 Pain in right shoulder: Secondary | ICD-10-CM | POA: Diagnosis not present

## 2019-05-23 DIAGNOSIS — M25512 Pain in left shoulder: Secondary | ICD-10-CM | POA: Diagnosis not present

## 2019-05-23 DIAGNOSIS — Z9181 History of falling: Secondary | ICD-10-CM | POA: Diagnosis not present

## 2019-05-27 DIAGNOSIS — Z9181 History of falling: Secondary | ICD-10-CM | POA: Diagnosis not present

## 2019-05-27 DIAGNOSIS — M25511 Pain in right shoulder: Secondary | ICD-10-CM | POA: Diagnosis not present

## 2019-05-27 DIAGNOSIS — M25512 Pain in left shoulder: Secondary | ICD-10-CM | POA: Diagnosis not present

## 2019-05-27 DIAGNOSIS — R262 Difficulty in walking, not elsewhere classified: Secondary | ICD-10-CM | POA: Diagnosis not present

## 2019-05-30 DIAGNOSIS — M25512 Pain in left shoulder: Secondary | ICD-10-CM | POA: Diagnosis not present

## 2019-05-30 DIAGNOSIS — R262 Difficulty in walking, not elsewhere classified: Secondary | ICD-10-CM | POA: Diagnosis not present

## 2019-05-30 DIAGNOSIS — M25511 Pain in right shoulder: Secondary | ICD-10-CM | POA: Diagnosis not present

## 2019-05-30 DIAGNOSIS — Z9181 History of falling: Secondary | ICD-10-CM | POA: Diagnosis not present

## 2019-06-08 DIAGNOSIS — Z1159 Encounter for screening for other viral diseases: Secondary | ICD-10-CM | POA: Diagnosis not present

## 2019-06-08 DIAGNOSIS — K648 Other hemorrhoids: Secondary | ICD-10-CM | POA: Diagnosis not present

## 2019-06-08 DIAGNOSIS — M1712 Unilateral primary osteoarthritis, left knee: Secondary | ICD-10-CM | POA: Diagnosis not present

## 2019-06-20 DIAGNOSIS — M25511 Pain in right shoulder: Secondary | ICD-10-CM | POA: Diagnosis not present

## 2019-06-20 DIAGNOSIS — R262 Difficulty in walking, not elsewhere classified: Secondary | ICD-10-CM | POA: Diagnosis not present

## 2019-06-20 DIAGNOSIS — M25512 Pain in left shoulder: Secondary | ICD-10-CM | POA: Diagnosis not present

## 2019-06-20 DIAGNOSIS — Z9181 History of falling: Secondary | ICD-10-CM | POA: Diagnosis not present

## 2019-06-27 DIAGNOSIS — M1711 Unilateral primary osteoarthritis, right knee: Secondary | ICD-10-CM | POA: Diagnosis not present

## 2019-06-27 DIAGNOSIS — R262 Difficulty in walking, not elsewhere classified: Secondary | ICD-10-CM | POA: Diagnosis not present

## 2019-06-27 DIAGNOSIS — M25512 Pain in left shoulder: Secondary | ICD-10-CM | POA: Diagnosis not present

## 2019-06-27 DIAGNOSIS — M25511 Pain in right shoulder: Secondary | ICD-10-CM | POA: Diagnosis not present

## 2019-06-27 DIAGNOSIS — Z9181 History of falling: Secondary | ICD-10-CM | POA: Diagnosis not present

## 2019-06-27 DIAGNOSIS — Z1159 Encounter for screening for other viral diseases: Secondary | ICD-10-CM | POA: Diagnosis not present

## 2019-07-01 DIAGNOSIS — M25512 Pain in left shoulder: Secondary | ICD-10-CM | POA: Diagnosis not present

## 2019-07-01 DIAGNOSIS — Z9181 History of falling: Secondary | ICD-10-CM | POA: Diagnosis not present

## 2019-07-01 DIAGNOSIS — R262 Difficulty in walking, not elsewhere classified: Secondary | ICD-10-CM | POA: Diagnosis not present

## 2019-07-01 DIAGNOSIS — M25511 Pain in right shoulder: Secondary | ICD-10-CM | POA: Diagnosis not present

## 2019-07-06 DIAGNOSIS — M25511 Pain in right shoulder: Secondary | ICD-10-CM | POA: Diagnosis not present

## 2019-07-06 DIAGNOSIS — M25512 Pain in left shoulder: Secondary | ICD-10-CM | POA: Diagnosis not present

## 2019-07-06 DIAGNOSIS — R262 Difficulty in walking, not elsewhere classified: Secondary | ICD-10-CM | POA: Diagnosis not present

## 2019-07-06 DIAGNOSIS — Z9181 History of falling: Secondary | ICD-10-CM | POA: Diagnosis not present

## 2019-07-15 DIAGNOSIS — Z9181 History of falling: Secondary | ICD-10-CM | POA: Diagnosis not present

## 2019-07-15 DIAGNOSIS — R262 Difficulty in walking, not elsewhere classified: Secondary | ICD-10-CM | POA: Diagnosis not present

## 2019-07-15 DIAGNOSIS — M25512 Pain in left shoulder: Secondary | ICD-10-CM | POA: Diagnosis not present

## 2019-07-15 DIAGNOSIS — M25511 Pain in right shoulder: Secondary | ICD-10-CM | POA: Diagnosis not present

## 2019-07-18 DIAGNOSIS — M25511 Pain in right shoulder: Secondary | ICD-10-CM | POA: Diagnosis not present

## 2019-07-18 DIAGNOSIS — R262 Difficulty in walking, not elsewhere classified: Secondary | ICD-10-CM | POA: Diagnosis not present

## 2019-07-18 DIAGNOSIS — M25512 Pain in left shoulder: Secondary | ICD-10-CM | POA: Diagnosis not present

## 2019-07-18 DIAGNOSIS — Z9181 History of falling: Secondary | ICD-10-CM | POA: Diagnosis not present

## 2019-07-25 DIAGNOSIS — R262 Difficulty in walking, not elsewhere classified: Secondary | ICD-10-CM | POA: Diagnosis not present

## 2019-07-25 DIAGNOSIS — M25511 Pain in right shoulder: Secondary | ICD-10-CM | POA: Diagnosis not present

## 2019-07-25 DIAGNOSIS — Z9181 History of falling: Secondary | ICD-10-CM | POA: Diagnosis not present

## 2019-07-25 DIAGNOSIS — M25512 Pain in left shoulder: Secondary | ICD-10-CM | POA: Diagnosis not present

## 2019-07-27 DIAGNOSIS — Z9181 History of falling: Secondary | ICD-10-CM | POA: Diagnosis not present

## 2019-07-27 DIAGNOSIS — R262 Difficulty in walking, not elsewhere classified: Secondary | ICD-10-CM | POA: Diagnosis not present

## 2019-07-27 DIAGNOSIS — M25511 Pain in right shoulder: Secondary | ICD-10-CM | POA: Diagnosis not present

## 2019-07-27 DIAGNOSIS — M25512 Pain in left shoulder: Secondary | ICD-10-CM | POA: Diagnosis not present

## 2019-08-01 DIAGNOSIS — R600 Localized edema: Secondary | ICD-10-CM | POA: Diagnosis not present

## 2019-08-01 DIAGNOSIS — Z1159 Encounter for screening for other viral diseases: Secondary | ICD-10-CM | POA: Diagnosis not present

## 2019-08-01 DIAGNOSIS — M1711 Unilateral primary osteoarthritis, right knee: Secondary | ICD-10-CM | POA: Diagnosis not present

## 2019-08-02 DIAGNOSIS — M170 Bilateral primary osteoarthritis of knee: Secondary | ICD-10-CM | POA: Diagnosis not present

## 2019-08-02 DIAGNOSIS — Z1159 Encounter for screening for other viral diseases: Secondary | ICD-10-CM | POA: Diagnosis not present

## 2019-08-03 DIAGNOSIS — M25511 Pain in right shoulder: Secondary | ICD-10-CM | POA: Diagnosis not present

## 2019-08-03 DIAGNOSIS — R262 Difficulty in walking, not elsewhere classified: Secondary | ICD-10-CM | POA: Diagnosis not present

## 2019-08-03 DIAGNOSIS — M25512 Pain in left shoulder: Secondary | ICD-10-CM | POA: Diagnosis not present

## 2019-08-03 DIAGNOSIS — Z9181 History of falling: Secondary | ICD-10-CM | POA: Diagnosis not present

## 2019-08-10 DIAGNOSIS — M25512 Pain in left shoulder: Secondary | ICD-10-CM | POA: Diagnosis not present

## 2019-08-10 DIAGNOSIS — R262 Difficulty in walking, not elsewhere classified: Secondary | ICD-10-CM | POA: Diagnosis not present

## 2019-08-10 DIAGNOSIS — Z9181 History of falling: Secondary | ICD-10-CM | POA: Diagnosis not present

## 2019-08-10 DIAGNOSIS — M25511 Pain in right shoulder: Secondary | ICD-10-CM | POA: Diagnosis not present

## 2019-08-17 DIAGNOSIS — M25511 Pain in right shoulder: Secondary | ICD-10-CM | POA: Diagnosis not present

## 2019-08-17 DIAGNOSIS — Z9181 History of falling: Secondary | ICD-10-CM | POA: Diagnosis not present

## 2019-08-17 DIAGNOSIS — M25512 Pain in left shoulder: Secondary | ICD-10-CM | POA: Diagnosis not present

## 2019-08-17 DIAGNOSIS — R262 Difficulty in walking, not elsewhere classified: Secondary | ICD-10-CM | POA: Diagnosis not present

## 2019-08-24 DIAGNOSIS — R262 Difficulty in walking, not elsewhere classified: Secondary | ICD-10-CM | POA: Diagnosis not present

## 2019-08-24 DIAGNOSIS — M25512 Pain in left shoulder: Secondary | ICD-10-CM | POA: Diagnosis not present

## 2019-08-24 DIAGNOSIS — Z9181 History of falling: Secondary | ICD-10-CM | POA: Diagnosis not present

## 2019-08-24 DIAGNOSIS — M25511 Pain in right shoulder: Secondary | ICD-10-CM | POA: Diagnosis not present

## 2019-08-31 DIAGNOSIS — Z9181 History of falling: Secondary | ICD-10-CM | POA: Diagnosis not present

## 2019-08-31 DIAGNOSIS — M25512 Pain in left shoulder: Secondary | ICD-10-CM | POA: Diagnosis not present

## 2019-08-31 DIAGNOSIS — R262 Difficulty in walking, not elsewhere classified: Secondary | ICD-10-CM | POA: Diagnosis not present

## 2019-08-31 DIAGNOSIS — M25511 Pain in right shoulder: Secondary | ICD-10-CM | POA: Diagnosis not present

## 2019-09-01 ENCOUNTER — Ambulatory Visit: Payer: PPO | Admitting: Legal Medicine

## 2019-09-05 DIAGNOSIS — F02818 Dementia in other diseases classified elsewhere, unspecified severity, with other behavioral disturbance: Secondary | ICD-10-CM | POA: Insufficient documentation

## 2019-09-05 DIAGNOSIS — F0281 Dementia in other diseases classified elsewhere with behavioral disturbance: Secondary | ICD-10-CM | POA: Insufficient documentation

## 2019-09-05 DIAGNOSIS — M1712 Unilateral primary osteoarthritis, left knee: Secondary | ICD-10-CM | POA: Insufficient documentation

## 2019-09-05 DIAGNOSIS — M810 Age-related osteoporosis without current pathological fracture: Secondary | ICD-10-CM | POA: Insufficient documentation

## 2019-09-05 DIAGNOSIS — I4811 Longstanding persistent atrial fibrillation: Secondary | ICD-10-CM | POA: Insufficient documentation

## 2019-09-06 ENCOUNTER — Other Ambulatory Visit: Payer: Self-pay

## 2019-09-06 ENCOUNTER — Ambulatory Visit (INDEPENDENT_AMBULATORY_CARE_PROVIDER_SITE_OTHER): Payer: PPO | Admitting: Legal Medicine

## 2019-09-06 ENCOUNTER — Encounter: Payer: Self-pay | Admitting: Legal Medicine

## 2019-09-06 VITALS — BP 140/80 | HR 79 | Temp 97.3°F | Resp 17 | Ht 62.0 in | Wt 161.0 lb

## 2019-09-06 DIAGNOSIS — M7501 Adhesive capsulitis of right shoulder: Secondary | ICD-10-CM | POA: Diagnosis not present

## 2019-09-06 DIAGNOSIS — E1142 Type 2 diabetes mellitus with diabetic polyneuropathy: Secondary | ICD-10-CM | POA: Diagnosis not present

## 2019-09-06 DIAGNOSIS — F02818 Dementia in other diseases classified elsewhere, unspecified severity, with other behavioral disturbance: Secondary | ICD-10-CM

## 2019-09-06 DIAGNOSIS — E782 Mixed hyperlipidemia: Secondary | ICD-10-CM | POA: Diagnosis not present

## 2019-09-06 DIAGNOSIS — M1712 Unilateral primary osteoarthritis, left knee: Secondary | ICD-10-CM | POA: Diagnosis not present

## 2019-09-06 DIAGNOSIS — G2581 Restless legs syndrome: Secondary | ICD-10-CM | POA: Insufficient documentation

## 2019-09-06 DIAGNOSIS — F0281 Dementia in other diseases classified elsewhere with behavioral disturbance: Secondary | ICD-10-CM

## 2019-09-06 DIAGNOSIS — G301 Alzheimer's disease with late onset: Secondary | ICD-10-CM

## 2019-09-06 DIAGNOSIS — N3946 Mixed incontinence: Secondary | ICD-10-CM | POA: Diagnosis not present

## 2019-09-06 DIAGNOSIS — I4811 Longstanding persistent atrial fibrillation: Secondary | ICD-10-CM | POA: Diagnosis not present

## 2019-09-06 DIAGNOSIS — F22 Delusional disorders: Secondary | ICD-10-CM

## 2019-09-06 DIAGNOSIS — M810 Age-related osteoporosis without current pathological fracture: Secondary | ICD-10-CM

## 2019-09-06 DIAGNOSIS — I10 Essential (primary) hypertension: Secondary | ICD-10-CM | POA: Diagnosis not present

## 2019-09-06 MED ORDER — TRIAMCINOLONE ACETONIDE 40 MG/ML IJ SUSP
40.0000 mg | Freq: Once | INTRAMUSCULAR | Status: AC
  Administered 2019-09-06: 40 mg via INTRA_ARTICULAR

## 2019-09-06 MED ORDER — TRIAMCINOLONE ACETONIDE 40 MG/ML IJ SUSP: 40.0000 mg | Freq: Once | INTRAMUSCULAR | Status: DC

## 2019-09-06 MED ORDER — ROPINIROLE HCL 1 MG PO TABS: 1.0000 mg | ORAL_TABLET | Freq: Every day | ORAL | 3 refills | Status: DC

## 2019-09-06 NOTE — Assessment & Plan Note (Signed)
Right shoulder was prepped with alcohol, informed consent verbal.  I injected 2 cc 1% xylocaine and 1 cc kenalog into subacromial bursa.  No complications and no blood loss.  Exercises given

## 2019-09-06 NOTE — Assessment & Plan Note (Signed)
AN INDIVIDUAL CARE PLAN was established and reinforced today.  The patient's status was assessed using clinical findings on exam, labs, and other diagnostic testing. Patient's success at meeting treatment goals based on disease specific evidence-bassed guidelines and found to be in fair control. RECOMMENDATIONS include maintaining present medicines and treatment. 

## 2019-09-06 NOTE — Patient Instructions (Signed)
Shoulder Exercises Ask your health care provider which exercises are safe for you. Do exercises exactly as told by your health care provider and adjust them as directed. It is normal to feel mild stretching, pulling, tightness, or discomfort as you do these exercises. Stop right away if you feel sudden pain or your pain gets worse. Do not begin these exercises until told by your health care provider. Stretching exercises External rotation and abduction This exercise is sometimes called corner stretch. This exercise rotates your arm outward (external rotation) and moves your arm out from your body (abduction). 1. Stand in a doorway with one of your feet slightly in front of the other. This is called a staggered stance. If you cannot reach your forearms to the door frame, stand facing a corner of a room. 2. Choose one of the following positions as told by your health care provider: ? Place your hands and forearms on the door frame above your head. ? Place your hands and forearms on the door frame at the height of your head. ? Place your hands on the door frame at the height of your elbows. 3. Slowly move your weight onto your front foot until you feel a stretch across your chest and in the front of your shoulders. Keep your head and chest upright and keep your abdominal muscles tight. 4. Hold for __________ seconds. 5. To release the stretch, shift your weight to your back foot. Repeat __________ times. Complete this exercise __________ times a day. Extension, standing 1. Stand and hold a broomstick, a cane, or a similar object behind your back. ? Your hands should be a little wider than shoulder width apart. ? Your palms should face away from your back. 2. Keeping your elbows straight and your shoulder muscles relaxed, move the stick away from your body until you feel a stretch in your shoulders (extension). ? Avoid shrugging your shoulders while you move the stick. Keep your shoulder blades tucked  down toward the middle of your back. 3. Hold for __________ seconds. 4. Slowly return to the starting position. Repeat __________ times. Complete this exercise __________ times a day. Range-of-motion exercises Pendulum  1. Stand near a wall or a surface that you can hold onto for balance. 2. Bend at the waist and let your left / right arm hang straight down. Use your other arm to support you. Keep your back straight and do not lock your knees. 3. Relax your left / right arm and shoulder muscles, and move your hips and your trunk so your left / right arm swings freely. Your arm should swing because of the motion of your body, not because you are using your arm or shoulder muscles. 4. Keep moving your hips and trunk so your arm swings in the following directions, as told by your health care provider: ? Side to side. ? Forward and backward. ? In clockwise and counterclockwise circles. 5. Continue each motion for __________ seconds, or for as long as told by your health care provider. 6. Slowly return to the starting position. Repeat __________ times. Complete this exercise __________ times a day. Shoulder flexion, standing  1. Stand and hold a broomstick, a cane, or a similar object. Place your hands a little more than shoulder width apart on the object. Your left / right hand should be palm up, and your other hand should be palm down. 2. Keep your elbow straight and your shoulder muscles relaxed. Push the stick up with your healthy arm to   raise your left / right arm in front of your body, and then over your head until you feel a stretch in your shoulder (flexion). ? Avoid shrugging your shoulder while you raise your arm. Keep your shoulder blade tucked down toward the middle of your back. 3. Hold for __________ seconds. 4. Slowly return to the starting position. Repeat __________ times. Complete this exercise __________ times a day. Shoulder abduction, standing 1. Stand and hold a broomstick,  a cane, or a similar object. Place your hands a little more than shoulder width apart on the object. Your left / right hand should be palm up, and your other hand should be palm down. 2. Keep your elbow straight and your shoulder muscles relaxed. Push the object across your body toward your left / right side. Raise your left / right arm to the side of your body (abduction) until you feel a stretch in your shoulder. ? Do not raise your arm above shoulder height unless your health care provider tells you to do that. ? If directed, raise your arm over your head. ? Avoid shrugging your shoulder while you raise your arm. Keep your shoulder blade tucked down toward the middle of your back. 3. Hold for __________ seconds. 4. Slowly return to the starting position. Repeat __________ times. Complete this exercise __________ times a day. Internal rotation  1. Place your left / right hand behind your back, palm up. 2. Use your other hand to dangle an exercise band, a towel, or a similar object over your shoulder. Grasp the band with your left / right hand so you are holding on to both ends. 3. Gently pull up on the band until you feel a stretch in the front of your left / right shoulder. The movement of your arm toward the center of your body is called internal rotation. ? Avoid shrugging your shoulder while you raise your arm. Keep your shoulder blade tucked down toward the middle of your back. 4. Hold for __________ seconds. 5. Release the stretch by letting go of the band and lowering your hands. Repeat __________ times. Complete this exercise __________ times a day. Strengthening exercises External rotation  1. Sit in a stable chair without armrests. 2. Secure an exercise band to a stable object at elbow height on your left / right side. 3. Place a soft object, such as a folded towel or a small pillow, between your left / right upper arm and your body to move your elbow about 4 inches (10 cm) away  from your side. 4. Hold the end of the exercise band so it is tight and there is no slack. 5. Keeping your elbow pressed against the soft object, slowly move your forearm out, away from your abdomen (external rotation). Keep your body steady so only your forearm moves. 6. Hold for __________ seconds. 7. Slowly return to the starting position. Repeat __________ times. Complete this exercise __________ times a day. Shoulder abduction  1. Sit in a stable chair without armrests, or stand up. 2. Hold a __________ weight in your left / right hand, or hold an exercise band with both hands. 3. Start with your arms straight down and your left / right palm facing in, toward your body. 4. Slowly lift your left / right hand out to your side (abduction). Do not lift your hand above shoulder height unless your health care provider tells you that this is safe. ? Keep your arms straight. ? Avoid shrugging your shoulder while you   do this movement. Keep your shoulder blade tucked down toward the middle of your back. 5. Hold for __________ seconds. 6. Slowly lower your arm, and return to the starting position. Repeat __________ times. Complete this exercise __________ times a day. Shoulder extension 1. Sit in a stable chair without armrests, or stand up. 2. Secure an exercise band to a stable object in front of you so it is at shoulder height. 3. Hold one end of the exercise band in each hand. Your palms should face each other. 4. Straighten your elbows and lift your hands up to shoulder height. 5. Step back, away from the secured end of the exercise band, until the band is tight and there is no slack. 6. Squeeze your shoulder blades together as you pull your hands down to the sides of your thighs (extension). Stop when your hands are straight down by your sides. Do not let your hands go behind your body. 7. Hold for __________ seconds. 8. Slowly return to the starting position. Repeat __________ times.  Complete this exercise __________ times a day. Shoulder row 1. Sit in a stable chair without armrests, or stand up. 2. Secure an exercise band to a stable object in front of you so it is at waist height. 3. Hold one end of the exercise band in each hand. Position your palms so that your thumbs are facing the ceiling (neutral position). 4. Bend each of your elbows to a 90-degree angle (right angle) and keep your upper arms at your sides. 5. Step back until the band is tight and there is no slack. 6. Slowly pull your elbows back behind you. 7. Hold for __________ seconds. 8. Slowly return to the starting position. Repeat __________ times. Complete this exercise __________ times a day. Shoulder press-ups  1. Sit in a stable chair that has armrests. Sit upright, with your feet flat on the floor. 2. Put your hands on the armrests so your elbows are bent and your fingers are pointing forward. Your hands should be about even with the sides of your body. 3. Push down on the armrests and use your arms to lift yourself off the chair. Straighten your elbows and lift yourself up as much as you comfortably can. ? Move your shoulder blades down, and avoid letting your shoulders move up toward your ears. ? Keep your feet on the ground. As you get stronger, your feet should support less of your body weight as you lift yourself up. 4. Hold for __________ seconds. 5. Slowly lower yourself back into the chair. Repeat __________ times. Complete this exercise __________ times a day. Wall push-ups  1. Stand so you are facing a stable wall. Your feet should be about one arm-length away from the wall. 2. Lean forward and place your palms on the wall at shoulder height. 3. Keep your feet flat on the floor as you bend your elbows and lean forward toward the wall. 4. Hold for __________ seconds. 5. Straighten your elbows to push yourself back to the starting position. Repeat __________ times. Complete this exercise  __________ times a day. This information is not intended to replace advice given to you by your health care provider. Make sure you discuss any questions you have with your health care provider. Document Revised: 11/05/2018 Document Reviewed: 08/13/2018 [employer] Patient Education  2020 [employer]  

## 2019-09-06 NOTE — Assessment & Plan Note (Signed)
AN INDIVIDUAL CARE PLAN was established and reinforced today.  The patient's status was assessed using clinical findings on exam, labs, and other diagnostic testing. Patient's success at meeting treatment goals based on disease specific evidence-bassed guidelines and found to be in good control. RECOMMENDATIONS include maintaining present medicines and treatment. 

## 2019-09-06 NOTE — Assessment & Plan Note (Signed)
AN INDIVIDUAL CARE PLAN was established and reinforced today.  The patient's status was assessed using clinical findings on exam, labs, and other diagnostic testing. Patient's success at meeting treatment goals based on disease specific evidence-bassed guidelines and found to be in good control. She is high risk for falls so is not on anticoagulant RECOMMENDATIONS include maintaining present medicines and treatment.

## 2019-09-06 NOTE — Assessment & Plan Note (Signed)

## 2019-09-06 NOTE — Assessment & Plan Note (Signed)
Patient needs to keep wrapped.  Ay need injection in future

## 2019-09-06 NOTE — Assessment & Plan Note (Signed)
An individual care plan was established and reinforced today.  The patient's status was assessed using clinical findings on exam and labs or diagnostic tests. The patient's success at meeting treatment goals on disease specific evidence-based guidelines and found to be good controlled. SELF MANAGEMENT: The patient and I together assessed ways to personally work towards obtaining the recommended goals. RECOMMENDATIONS: avoid decongestants found in common cold remedies, decrease consumption of alcohol, perform routine monitoring of BP with home BP cuff, exercise, reduction of dietary salt, take medicines as prescribed, try not to miss doses and quit smoking.  Regular exercise and maintaining a healthy weight is needed.  Stress reduction may help. A CLINICAL SUMMARY including written plan identify barriers to care unique to individual due to social or financial issues.  We attempt to mutually creat solutions for individual and family understanding. 

## 2019-09-06 NOTE — Assessment & Plan Note (Signed)
AN INDIVIDUAL CARE PLAN was established and reinforced today.  The patient's status was assessed using clinical findings on exam, labs, and other diagnostic testing. Patient's success at meeting treatment goals based on disease specific evidence-bassed guidelines and found to be in fair control. requip was ordered. RECOMMENDATIONS include maintaining present medicines and treatment.

## 2019-09-06 NOTE — Progress Notes (Signed)
Established Patient Office Visit  Subjective:  Patient ID: Sandra Lynch, female    DOB: [birth year]  Age: 85 y.o. MRN: [id number]  CC:  Chief Complaint  Patient presents with  . Osteoarthritis  . Atrial Fibrillation  . Anxiety    HPI Sandra Lynch presents for chronic visit  Patient presents for follow up of hypertension.  Patient tolerating amlodipine well with side effects.  Patient was diagnosed with hypertension 2010 so has been treated for hypertension for 10 years.Patient is working on maintaining diet and exercise regimen and follows up as directed.  Patient present with type 2 diabetes.  Specifically, this is type 2, no insulin requiring diabetes, complicated by polyneuropathy.  Compliance with treatment has been good; patient take medicines as directed, maintains diet and exercise regimen, follows up as directed, and is keeping glucose diary.  Date of  diagnosis 2010.  Depression screen has been performed.Tobacco screen nonsmoker. Current medicines for diabetes none.  Patient is on none lisinopril stopped due to renal changes for renal protection and prvastatin for cholesterol control.  Patient performs foot exams daily and last ophthalmologic exam was years.  Patient has dementia and is in wheel chair.  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  No hallucinosis.  Osteoporosis: .She is stable on medicines.  Edema legs: We discussed using TED hose and elevation legs.  Past Medical History:  Diagnosis Date  . Allergic rhinitis   . Anxiety   . Arthritis    "scattered in my joints" (02/11/2018)  . Atrial fibrillation ([place])   . Cough   . Essential hypertension   . GERD (gastroesophageal reflux disease)   . Hyperlipidemia   . Hyponatremia   . Pneumonia ~ 11/2017   walking pneumonia  . Pneumonia    "as a child"  . Urge incontinence     Past Surgical History:  Procedure Laterality Date  . APPENDECTOMY    . BUNIONECTOMY WITH HAMMERTOE  RECONSTRUCTION Right 02/11/2018  . CARDIOVASCULAR STRESS TEST     01/25/18 [facility]): No reversible defects, fixed defect of mod-size in the septal wall mid ventricle to base, hypokinetic septal wall from mid ventricle to base, LVEF 50%, low risk  . HALLUX FUSION Right 02/11/2018   Procedure: HALLUX MPJ FUSION RIGHT FOOT;  Surgeon: [name], DPM;  Location: [place];  Service: Podiatry;  Laterality: Right;  . HAMMER TOE SURGERY Right 02/11/2018   Procedure: RIGHT HAMMER TOE CORRECTION SECOND, THIRD, FOURTH AND FIFTH;  Surgeon: [name], DPM;  Location: [place];  Service: Podiatry;  Laterality: Right;  . METATARSAL HEAD EXCISION Right 02/11/2018   Procedure: RIGHT PAN METATARSAL HEAD RESECTION;  Surgeon: [name], DPM;  Location: [place];  Service: Podiatry;  Laterality: Right;  . TOTAL ABDOMINAL HYSTERECTOMY    . TRANSTHORACIC ECHOCARDIOGRAM     01/24/18 [facility]): LV systolic function normal, EF 55-60%, impaired relaxation, LA mildly dilated, trace-mild MR, trace TR    Family History  Problem Relation Age of Onset  . Pneumonia Mother   . Heart attack Father   . Hypertension Father   . Other Brother        Oncologist    Social History   Socioeconomic History  . Marital status: Widowed    Spouse name: Not on file  . Number of children: Not on file  . Years of education: Not on file  . Highest education level: Not on file  Occupational History  .  Not on file  Tobacco Use  . Smoking status: Never Smoker  . Smokeless tobacco: Never Used  Substance and Sexual Activity  . Alcohol use: Not Currently    Comment: 02/11/2018 "couple driinks/month"  . Drug use: Never  . Sexual activity: Not Currently  Other Topics Concern  . Not on file  Social History Narrative  . Not on file   Social Determinants of Health   Financial Resource Strain:   . Difficulty of Paying Living Expenses: Not on file  Food Insecurity:   . Worried About Charity fundraiser in the Last  Year: Not on file  . Ran Out of Food in the Last Year: Not on file  Transportation Needs:   . Lack of Transportation (Medical): Not on file  . Lack of Transportation (Non-Medical): Not on file  Physical Activity:   . Days of Exercise per Week: Not on file  . Minutes of Exercise per Session: Not on file  Stress:   . Feeling of Stress : Not on file  Social Connections:   . Frequency of Communication with Friends and Family: Not on file  . Frequency of Social Gatherings with Friends and Family: Not on file  . Attends Religious Services: Not on file  . Active Member of Clubs or Organizations: Not on file  . Attends Archivist Meetings: Not on file  . Marital Status: Not on file  Intimate Partner Violence:   . Fear of Current or Ex-Partner: Not on file  . Emotionally Abused: Not on file  . Physically Abused: Not on file  . Sexually Abused: Not on file    Outpatient Medications Prior to Visit  Medication Sig Dispense Refill  . alendronate (FOSAMAX) 70 MG tablet PLEASE SEE ATTACHED FOR DETAILED DIRECTIONS  3  . ALPRAZolam (XANAX) 0.25 MG tablet Take 1-2 tablets (0.25-0.5 mg total) by mouth 2 (two) times daily as needed for anxiety. 2 tablet 0  . amLODipine (NORVASC) 5 MG tablet Take 5 mg by mouth daily.  3  . aspirin EC 81 MG tablet Take 1 tablet by mouth daily.    . Calcium Carbonate-Vitamin D (CALTRATE 600+D PO) Take 1 tablet by mouth daily.    . fluticasone (FLONASE) 50 MCG/ACT nasal spray Place 1 spray into both nostrils daily.  3  . furosemide (LASIX) 20 MG tablet Take 20 mg by mouth.    . mirtazapine (REMERON) 15 MG tablet Take 15 mg by mouth at bedtime.    . Multiple Vitamins-Minerals (CENTRUM SILVER 50+WOMEN PO) Take 1 tablet by mouth daily.     Marland Kitchen MYRBETRIQ 25 MG TB24 tablet Take 25 mg by mouth daily.  4  . Omega-3 Fatty Acids (FISH OIL) 1000 MG CAPS Take 1 tablet by mouth daily.    Marland Kitchen omeprazole (PRILOSEC) 40 MG capsule Take 40 mg by mouth daily.    .  pramoxine-hydrocortisone (PROCTOCREAM-HC) 1-1 % rectal cream APPLY TO AFFECTED AREA 3 TIMES A DAY    . pravastatin (PRAVACHOL) 40 MG tablet Take 40 mg by mouth at bedtime.  3  . traMADol (ULTRAM) 50 MG tablet TAKE 1 TABLET EVERY 6 HOURS AS NEEDED FOR PAIN 30 tablet 0  . valsartan (DIOVAN) 40 MG tablet Take 40 mg by mouth daily. for blood pressure  0  . mirabegron ER (MYRBETRIQ) 25 MG TB24 tablet Take 25 mg by mouth daily.    . mirtazapine (REMERON) 15 MG tablet Take 15 mg by mouth at bedtime.    Marland Kitchen omeprazole (  PRILOSEC) 20 MG capsule Take 20 mg by mouth daily as needed (heartburn).     . enoxaparin (LOVENOX) 30 MG/0.3ML injection Inject 0.3 mLs (30 mg total) into the skin daily. (Patient not taking: Reported on 09/06/2019) 14 Syringe 0  . gabapentin (NEURONTIN) 300 MG capsule Take 1 capsule (300 mg total) by mouth at bedtime. (Patient not taking: Reported on 09/06/2019) 30 capsule 0  . oxyCODONE-acetaminophen (PERCOCET) 10-325 MG tablet Take 1 tablet by mouth every 4 (four) hours as needed for pain. (Patient not taking: Reported on 09/06/2019) 20 tablet 0  . sodium chloride (OCEAN) 0.65 % SOLN nasal spray Place 1 spray into both nostrils as needed for congestion.     No facility-administered medications prior to visit.    Allergies  Allergen Reactions  . Lisinopril Cough    ROS Review of Systems  Constitutional: Negative.   HENT: Negative.   Eyes: Negative.   Respiratory: Negative.   Cardiovascular: Negative.   Gastrointestinal: Negative.   Genitourinary: Negative.   Musculoskeletal: Positive for gait problem.       Having restless legs      Objective:    Physical Exam  Constitutional: She is oriented to person, place, and time. She appears well-developed and well-nourished.  HENT:  Head: Normocephalic and atraumatic.  Eyes: Pupils are equal, round, and reactive to light. Conjunctivae and EOM are normal.  Cardiovascular: Normal rate, regular rhythm and normal heart sounds.  2+  peripheral edema without any skin breakdown  Pulmonary/Chest: Effort normal and breath sounds normal.  Abdominal: Soft. Bowel sounds are normal.  Musculoskeletal:     Right shoulder: Tenderness and pain present. Decreased range of motion.       Arms:     Cervical back: Normal range of motion and neck supple.     Comments: Pain in right shoulder with adhesive capulitis.  ABD 30 degrees Flexion 45 degrees, unable to rotate.  Neurological: She is alert and oriented to person, place, and time. She has normal reflexes.  Skin: Skin is warm and dry.  Psychiatric: She has a normal mood and affect. Her behavior is normal.    BP 140/80 (BP Location: Right Arm, Patient Position: Sitting)   Pulse 79   Temp (!) 97.3 F (36.3 C) (Temporal)   Resp 17   Ht 5\' 2"  (1.575 m)   Wt 161 lb (73 kg)   SpO2 95%   BMI 29.45 kg/m  Wt Readings from Last 3 Encounters:  09/06/19 161 lb (73 kg)  02/11/18 134 lb (60.8 kg)  08/17/17 130 lb (59 kg)     Health Maintenance Due  Topic Date Due  . HEMOGLOBIN A1C  1935-06-26  . FOOT EXAM  04/16/1947  . OPHTHALMOLOGY EXAM  04/16/1947    There are no preventive care reminders to display for this patient.  No results found for: TSH Lab Results  Component Value Date   WBC 7.9 02/11/2018   HGB 13.9 02/11/2018   HCT 43.9 02/11/2018   MCV 95.0 02/11/2018   PLT 304 02/11/2018   Lab Results  Component Value Date   NA 138 02/11/2018   K 3.9 02/11/2018   CO2 25 02/11/2018   GLUCOSE 95 02/11/2018   BUN 15 02/11/2018   CREATININE 0.73 02/11/2018   CALCIUM 9.6 02/11/2018   ANIONGAP 11 02/11/2018   No results found for: CHOL No results found for: HDL No results found for: LDLCALC No results found for: TRIG No results found for: CHOLHDL No results found for:  HGBA1C    Assessment & Plan:   Problem List Items Addressed This Visit      Cardiovascular and Mediastinum   Essential hypertension - Primary (Chronic)    An individual care plan was  established and reinforced today.  The patient's status was assessed using clinical findings on exam and labs or diagnostic tests. The patient's success at meeting treatment goals on disease specific evidence-based guidelines and found to be good controlled. SELF MANAGEMENT: The patient and I together assessed ways to personally work towards obtaining the recommended goals. RECOMMENDATIONS: avoid decongestants found in common cold remedies, decrease consumption of alcohol, perform routine monitoring of BP with home BP cuff, exercise, reduction of dietary salt, take medicines as prescribed, try not to miss doses and quit smoking.  Regular exercise and maintaining a healthy weight is needed.  Stress reduction may help. A CLINICAL SUMMARY including written plan identify barriers to care unique to individual due to social or financial issues.  We attempt to mutually creat solutions for individual and family understanding.      Relevant Medications   furosemide (LASIX) 20 MG tablet   Other Relevant Orders   CBC with Differential   Comprehensive metabolic panel   Longstanding persistent atrial fibrillation (HCC) (Chronic)    AN INDIVIDUAL CARE PLAN was established and reinforced today.  The patient's status was assessed using clinical findings on exam, labs, and other diagnostic testing. Patient's success at meeting treatment goals based on disease specific evidence-bassed guidelines and found to be in good control. She is high risk for falls so is not on anticoagulant RECOMMENDATIONS include maintaining present medicines and treatment.      Relevant Medications   furosemide (LASIX) 20 MG tablet     Endocrine   Diabetic polyneuropathy associated with type 2 diabetes mellitus ([place])    An individual care plan was established and reinforced today.  The patient's status was assessed using clinical findings on exam, labs and diagnostic testing. Patient success at meeting goals based on disease specific  evidence-based guidelines and found to be good controlled. Medications were assessed and patient's understanding of the medical issues , including barriers were assessed. Recommend adherence to a diabetic diet, a graduated exercise program, HgbA1c level is checked quarterly, and urine microalbumin performed yearly .  Annual mono-filament sensation testing performed. Lower blood pressure and control hyperlipidemia is important. Get annual eye exams and annual flu shots and smoking cessation discussed.  Self management goals were discussed.      Relevant Medications   mirtazapine (REMERON) 15 MG tablet   rOPINIRole (REQUIP) 1 MG tablet   Other Relevant Orders   Hemoglobin A1c     Nervous and Auditory   Dementia of the Alzheimer's type, with late onset, with delusions ([place]) (Chronic)    AN INDIVIDUAL CARE PLAN was established and reinforced today.  The patient's status was assessed using clinical findings on exam, labs, and other diagnostic testing. Patient's success at meeting treatment goals based on disease specific evidence-bassed guidelines and found to be in fair control. RECOMMENDATIONS include maintaining present medicines and treatment.      Relevant Medications   mirtazapine (REMERON) 15 MG tablet   rOPINIRole (REQUIP) 1 MG tablet     Musculoskeletal and Integument   Osteoporosis (Chronic)    AN INDIVIDUAL CARE PLAN was established and reinforced today.  The patient's status was assessed using clinical findings on exam, labs, and other diagnostic testing. Patient's success at meeting treatment goals based on disease specific evidence-bassed guidelines  and found to be in good control. RECOMMENDATIONS include maintaining present medicines and treatment.      Degenerative arthritis of left knee (Chronic)    Patient needs to keep wrapped.  Ay need injection in future      Adhesive capsulitis of right shoulder   Relevant Orders   Arthrocentesis     Other   Hyperlipidemia    Relevant Medications   furosemide (LASIX) 20 MG tablet   Other Relevant Orders   Lipid Panel   Mixed incontinence    AN INDIVIDUAL CARE PLAN was established and reinforced today.  The patient's status was assessed using clinical findings on exam, labs, and other diagnostic testing. Patient's success at meeting treatment goals based on disease specific evidence-bassed guidelines and found to be in fair control. RECOMMENDATIONS include maintaining present medicines and treatment.       Restless leg    AN INDIVIDUAL CARE PLAN was established and reinforced today.  The patient's status was assessed using clinical findings on exam, labs, and other diagnostic testing. Patient's success at meeting treatment goals based on disease specific evidence-bassed guidelines and found to be in fair control. requip was ordered. RECOMMENDATIONS include maintaining present medicines and treatment.      Relevant Medications   rOPINIRole (REQUIP) 1 MG tablet      Meds ordered this encounter  Medications  . rOPINIRole (REQUIP) 1 MG tablet    Sig: Take 1 tablet (1 mg total) by mouth at bedtime.    Dispense:  30 tablet    Refill:  3  . DISCONTD: triamcinolone acetonide (KENALOG-40) injection 40 mg  Essential hypertension An individual care plan was established and reinforced today.  The patient's status was assessed using clinical findings on exam and labs or diagnostic tests. The patient's success at meeting treatment goals on disease specific evidence-based guidelines and found to be good controlled. SELF MANAGEMENT: The patient and I together assessed ways to personally work towards obtaining the recommended goals. RECOMMENDATIONS: avoid decongestants found in common cold remedies, decrease consumption of alcohol, perform routine monitoring of BP with home BP cuff, exercise, reduction of dietary salt, take medicines as prescribed, try not to miss doses and quit smoking.  Regular exercise and maintaining a  healthy weight is needed.  Stress reduction may help. A CLINICAL SUMMARY including written plan identify barriers to care unique to individual due to social or financial issues.  We attempt to mutually creat solutions for individual and family understanding.  Longstanding persistent atrial fibrillation ([place]) AN INDIVIDUAL CARE PLAN was established and reinforced today.  The patient's status was assessed using clinical findings on exam, labs, and other diagnostic testing. Patient's success at meeting treatment goals based on disease specific evidence-bassed guidelines and found to be in good control. She is high risk for falls so is not on anticoagulant RECOMMENDATIONS include maintaining present medicines and treatment.  Diabetic polyneuropathy associated with type 2 diabetes mellitus ([place]) An individual care plan was established and reinforced today.  The patient's status was assessed using clinical findings on exam, labs and diagnostic testing. Patient success at meeting goals based on disease specific evidence-based guidelines and found to be good controlled. Medications were assessed and patient's understanding of the medical issues , including barriers were assessed. Recommend adherence to a diabetic diet, a graduated exercise program, HgbA1c level is checked quarterly, and urine microalbumin performed yearly .  Annual mono-filament sensation testing performed. Lower blood pressure and control hyperlipidemia is important. Get annual eye exams and annual flu  shots and smoking cessation discussed.  Self management goals were discussed.  Dementia of the Alzheimer's type, with late onset, with delusions ([place]) AN INDIVIDUAL CARE PLAN was established and reinforced today.  The patient's status was assessed using clinical findings on exam, labs, and other diagnostic testing. Patient's success at meeting treatment goals based on disease specific evidence-bassed guidelines and found to be in fair  control. RECOMMENDATIONS include maintaining present medicines and treatment.  Osteoporosis AN INDIVIDUAL CARE PLAN was established and reinforced today.  The patient's status was assessed using clinical findings on exam, labs, and other diagnostic testing. Patient's success at meeting treatment goals based on disease specific evidence-bassed guidelines and found to be in good control. RECOMMENDATIONS include maintaining present medicines and treatment.  Degenerative arthritis of left knee Patient needs to keep wrapped.  Ay need injection in future  Mixed incontinence AN INDIVIDUAL CARE PLAN was established and reinforced today.  The patient's status was assessed using clinical findings on exam, labs, and other diagnostic testing. Patient's success at meeting treatment goals based on disease specific evidence-bassed guidelines and found to be in fair control. RECOMMENDATIONS include maintaining present medicines and treatment.   Restless leg AN INDIVIDUAL CARE PLAN was established and reinforced today.  The patient's status was assessed using clinical findings on exam, labs, and other diagnostic testing. Patient's success at meeting treatment goals based on disease specific evidence-bassed guidelines and found to be in fair control. requip was ordered. RECOMMENDATIONS include maintaining present medicines and treatment.  Adhesive capsulitis of right shoulder Right shoulder was prepped with alcohol, informed consent verbal.  I injected 2 cc 1% xylocaine and 1 cc kenalog into subacromial bursa.  No complications and no blood loss.  Exercises given   Follow-up: Return in about 3 months (around 12/04/2019) for fasting.    Reinaldo Meeker, MD

## 2019-09-07 DIAGNOSIS — M25511 Pain in right shoulder: Secondary | ICD-10-CM | POA: Diagnosis not present

## 2019-09-07 DIAGNOSIS — M25512 Pain in left shoulder: Secondary | ICD-10-CM | POA: Diagnosis not present

## 2019-09-07 DIAGNOSIS — R262 Difficulty in walking, not elsewhere classified: Secondary | ICD-10-CM | POA: Diagnosis not present

## 2019-09-07 DIAGNOSIS — Z9181 History of falling: Secondary | ICD-10-CM | POA: Diagnosis not present

## 2019-09-07 LAB — COMPREHENSIVE METABOLIC PANEL
ALT: 18 IU/L (ref 0–32)
AST: 21 IU/L (ref 0–40)
Albumin/Globulin Ratio: 1.5 (ref 1.2–2.2)
Albumin: 4.3 g/dL (ref 3.6–4.6)
Alkaline Phosphatase: 69 IU/L (ref 39–117)
BUN/Creatinine Ratio: 17 (ref 12–28)
BUN: 11 mg/dL (ref 8–27)
Bilirubin Total: 0.3 mg/dL (ref 0.0–1.2)
CO2: 27 mmol/L (ref 20–29)
Calcium: 9.8 mg/dL (ref 8.7–10.3)
Chloride: 97 mmol/L (ref 96–106)
Creatinine, Ser: 0.65 mg/dL (ref 0.57–1.00)
GFR calc Af Amer: 96 mL/min/{1.73_m2} (ref >59)
GFR calc non Af Amer: 83 mL/min/{1.73_m2} (ref >59)
Globulin, Total: 2.8 g/dL (ref 1.5–4.5)
Glucose: 93 mg/dL (ref 65–99)
Potassium: 4.4 mmol/L (ref 3.5–5.2)
Sodium: 139 mmol/L (ref 134–144)
Total Protein: 7.1 g/dL (ref 6.0–8.5)

## 2019-09-07 LAB — CARDIOVASCULAR RISK ASSESSMENT

## 2019-09-07 LAB — CBC WITH DIFFERENTIAL/PLATELET
Basophils Absolute: 0 10*3/uL (ref 0.0–0.2)
Basos: 0 %
EOS (ABSOLUTE): 0.1 10*3/uL (ref 0.0–0.4)
Eos: 1 %
Hematocrit: 42.1 % (ref 34.0–46.6)
Hemoglobin: 13.8 g/dL (ref 11.1–15.9)
Immature Grans (Abs): 0 10*3/uL (ref 0.0–0.1)
Immature Granulocytes: 0 %
Lymphocytes Absolute: 1.7 10*3/uL (ref 0.7–3.1)
Lymphs: 26 %
MCH: 29.5 pg (ref 26.6–33.0)
MCHC: 32.8 g/dL (ref 31.5–35.7)
MCV: 90 fL (ref 79–97)
Monocytes Absolute: 0.7 10*3/uL (ref 0.1–0.9)
Monocytes: 12 %
Neutrophils Absolute: 3.9 10*3/uL (ref 1.4–7.0)
Neutrophils: 61 %
Platelets: 350 10*3/uL (ref 150–450)
RBC: 4.68 x10E6/uL (ref 3.77–5.28)
RDW: 12.8 % (ref 11.7–15.4)
WBC: 6.4 10*3/uL (ref 3.4–10.8)

## 2019-09-07 LAB — LIPID PANEL
Chol/HDL Ratio: 2.7 ratio (ref 0.0–4.4)
Cholesterol, Total: 170 mg/dL (ref 100–199)
HDL: 64 mg/dL (ref >39)
LDL Chol Calc (NIH): 90 mg/dL (ref 0–99)
Triglycerides: 87 mg/dL (ref 0–149)
VLDL Cholesterol Cal: 16 mg/dL (ref 5–40)

## 2019-09-07 LAB — HEMOGLOBIN A1C
Est. average glucose Bld gHb Est-mCnc: 126 mg/dL
Hgb A1c MFr Bld: 6 % — ABNORMAL HIGH (ref 4.8–5.6)

## 2019-09-07 NOTE — Progress Notes (Signed)
No anemia, kidney and liver tests normal, cholesterol normal, A1c 6.0 good lp

## 2019-09-19 ENCOUNTER — Ambulatory Visit (INDEPENDENT_AMBULATORY_CARE_PROVIDER_SITE_OTHER): Payer: PPO | Admitting: Legal Medicine

## 2019-09-19 ENCOUNTER — Encounter: Payer: Self-pay | Admitting: Legal Medicine

## 2019-09-19 ENCOUNTER — Other Ambulatory Visit: Payer: Self-pay

## 2019-09-19 VITALS — BP 136/80 | HR 81 | Temp 97.1°F | Ht 62.0 in

## 2019-09-19 DIAGNOSIS — M1711 Unilateral primary osteoarthritis, right knee: Secondary | ICD-10-CM | POA: Diagnosis not present

## 2019-09-19 DIAGNOSIS — M170 Bilateral primary osteoarthritis of knee: Secondary | ICD-10-CM | POA: Diagnosis not present

## 2019-09-19 MED ORDER — TRIAMCINOLONE ACETONIDE 40 MG/ML IJ SUSP
80.0000 mg | Freq: Once | INTRAMUSCULAR | Status: AC
  Administered 2019-09-19: 80 mg via INTRA_ARTICULAR

## 2019-09-19 MED ORDER — TRIAMCINOLONE ACETONIDE 40 MG/ML IJ SUSP: 40.0000 mg | Freq: Once | INTRAMUSCULAR | Status: DC

## 2019-09-19 NOTE — Progress Notes (Addendum)
Acute Office Visit  Subjective:    Patient ID: Sandra Lynch, female    DOB: [birth year], 85 y.o.   MRN: [id number]  Chief Complaint  Patient presents with  . Arthritis    HPI Patient is in today for arthrocentesis.  She has severe arthritis both knees but the right knee is worse.  It has given out some but no falls.  She is not a surgical candidate. She is usually whee chair bound but with walk with her walker short distances.  Past Medical History:  Diagnosis Date  . Allergic rhinitis   . Anxiety   . Arthritis    "scattered in my joints" (02/11/2018)  . Atrial fibrillation ([place])   . Cough   . Essential hypertension   . GERD (gastroesophageal reflux disease)   . Hyperlipidemia   . Hyponatremia   . Pneumonia ~ 11/2017   walking pneumonia  . Pneumonia    "as a child"  . Urge incontinence     Past Surgical History:  Procedure Laterality Date  . APPENDECTOMY    . BUNIONECTOMY WITH HAMMERTOE RECONSTRUCTION Right 02/11/2018  . CARDIOVASCULAR STRESS TEST     01/25/18 [facility]): No reversible defects, fixed defect of mod-size in the septal wall mid ventricle to base, hypokinetic septal wall from mid ventricle to base, LVEF 50%, low risk  . HALLUX FUSION Right 02/11/2018   Procedure: HALLUX MPJ FUSION RIGHT FOOT;  Surgeon: [name], DPM;  Location: [place];  Service: Podiatry;  Laterality: Right;  . HAMMER TOE SURGERY Right 02/11/2018   Procedure: RIGHT HAMMER TOE CORRECTION SECOND, THIRD, FOURTH AND FIFTH;  Surgeon: [name], DPM;  Location: [place];  Service: Podiatry;  Laterality: Right;  . METATARSAL HEAD EXCISION Right 02/11/2018   Procedure: RIGHT PAN METATARSAL HEAD RESECTION;  Surgeon: [name], DPM;  Location: [place];  Service: Podiatry;  Laterality: Right;  . TOTAL ABDOMINAL HYSTERECTOMY    . TRANSTHORACIC ECHOCARDIOGRAM     01/24/18 [facility]): LV systolic function normal, EF 55-60%, impaired relaxation, LA mildly dilated, trace-mild MR,  trace TR    Family History  Problem Relation Age of Onset  . Pneumonia Mother   . Other Mother 79       Pneumonia  . Heart attack Father   . Hypertension Father   . Other Brother        Oncologist    Social History   Socioeconomic History  . Marital status: Widowed    Spouse name: Not on file  . Number of children: 2  . Years of education: Not on file  . Highest education level: Not on file  Occupational History  . Occupation: retired    Comment: Engineer, mining  Tobacco Use  . Smoking status: Never Smoker  . Smokeless tobacco: Never Used  Substance and Sexual Activity  . Alcohol use: Not Currently    Comment: 02/11/2018 "couple driinks/month"  . Drug use: Never  . Sexual activity: Not Currently  Other Topics Concern  . Not on file  Social History Narrative  . Not on file   Social Determinants of Health   Financial Resource Strain:   . Difficulty of Paying Living Expenses:   Food Insecurity:   . Worried About Charity fundraiser in the Last Year:   . Arboriculturist in the Last Year:   Transportation Needs:   . Film/video editor (Medical):   Marland Kitchen Lack of Transportation (Non-Medical):  Physical Activity:   . Days of Exercise per Week:   . Minutes of Exercise per Session:   Stress:   . Feeling of Stress :   Social Connections:   . Frequency of Communication with Friends and Family:   . Frequency of Social Gatherings with Friends and Family:   . Attends Religious Services:   . Active Member of Clubs or Organizations:   . Attends Archivist Meetings:   Marland Kitchen Marital Status:   Intimate Partner Violence:   . Fear of Current or Ex-Partner:   . Emotionally Abused:   Marland Kitchen Physically Abused:   . Sexually Abused:     Outpatient Medications Prior to Visit  Medication Sig Dispense Refill  . alendronate (FOSAMAX) 70 MG tablet PLEASE SEE ATTACHED FOR DETAILED DIRECTIONS  3  . ALPRAZolam (XANAX) 0.25 MG tablet Take 1-2 tablets (0.25-0.5 mg total)  by mouth 2 (two) times daily as needed for anxiety. 2 tablet 0  . aspirin EC 81 MG tablet Take 1 tablet by mouth daily.    . Calcium Carbonate-Vitamin D (CALTRATE 600+D PO) Take 1 tablet by mouth daily.    . fluticasone (FLONASE) 50 MCG/ACT nasal spray Place 1 spray into both nostrils daily.  3  . furosemide (LASIX) 20 MG tablet Take 20 mg by mouth.    . Multiple Vitamins-Minerals (CENTRUM SILVER 50+WOMEN PO) Take 1 tablet by mouth daily.     Marland Kitchen MYRBETRIQ 25 MG TB24 tablet Take 25 mg by mouth daily.  4  . Omega-3 Fatty Acids (FISH OIL) 1000 MG CAPS Take 1 tablet by mouth daily.    Marland Kitchen omeprazole (PRILOSEC) 40 MG capsule Take 40 mg by mouth daily.    . pramoxine-hydrocortisone (PROCTOCREAM-HC) 1-1 % rectal cream APPLY TO AFFECTED AREA 3 TIMES A DAY    . pravastatin (PRAVACHOL) 40 MG tablet Take 40 mg by mouth at bedtime.  3  . sodium chloride (OCEAN) 0.65 % SOLN nasal spray Place 1 spray into both nostrils as needed for congestion.    . valsartan (DIOVAN) 40 MG tablet Take 40 mg by mouth daily. for blood pressure  0  . amLODipine (NORVASC) 5 MG tablet Take 5 mg by mouth daily.  3  . enoxaparin (LOVENOX) 30 MG/0.3ML injection Inject 0.3 mLs (30 mg total) into the skin daily. (Patient not taking: Reported on 09/06/2019) 14 Syringe 0  . gabapentin (NEURONTIN) 300 MG capsule Take 1 capsule (300 mg total) by mouth at bedtime. (Patient not taking: Reported on 09/06/2019) 30 capsule 0  . mirtazapine (REMERON) 15 MG tablet Take 15 mg by mouth at bedtime.    Marland Kitchen oxyCODONE-acetaminophen (PERCOCET) 10-325 MG tablet Take 1 tablet by mouth every 4 (four) hours as needed for pain. 20 tablet 0  . rOPINIRole (REQUIP) 1 MG tablet Take 1 tablet (1 mg total) by mouth at bedtime. 30 tablet 3  . traMADol (ULTRAM) 50 MG tablet TAKE 1 TABLET EVERY 6 HOURS AS NEEDED FOR PAIN 30 tablet 0   No facility-administered medications prior to visit.    Allergies  Allergen Reactions  . Pramipexole Other (See Comments)    She cannot  walk or focus in anything.   Marland Kitchen Requip [Ropinirole] Shortness Of Breath  . Lisinopril Cough    Review of Systems  Constitutional: Negative.   HENT: Negative.   Respiratory: Negative.   Cardiovascular: Negative.   Musculoskeletal: Positive for arthralgias.       Objective:    Physical Exam Vitals reviewed.  Constitutional:  Appearance: Normal appearance.  Musculoskeletal:        General: Tenderness present.     Right knee: Swelling and deformity present. Tenderness present.     Left knee: Swelling, deformity and effusion present.       Legs:     Comments: Crepitation on ROM.Limited ROM  Neurological:     Mental Status: She is alert.     BP 136/80   Pulse 81   Temp (!) 97.1 F (36.2 C)   Ht 5\' 2"  (1.575 m)   BMI 29.45 kg/m  Wt Readings from Last 3 Encounters:  10/19/19 162 lb (73.5 kg)  09/26/19 159 lb 12.8 oz (72.5 kg)  09/06/19 161 lb (73 kg)    Health Maintenance Due  Topic Date Due  . FOOT EXAM  Never done  . OPHTHALMOLOGY EXAM  Never done  . MAMMOGRAM  03/29/2019    There are no preventive care reminders to display for this patient.   No results found for: TSH Lab Results  Component Value Date   WBC 6.4 09/06/2019   HGB 13.8 09/06/2019   HCT 42.1 09/06/2019   MCV 90 09/06/2019   PLT 350 09/06/2019   Lab Results  Component Value Date   NA 139 09/06/2019   K 4.4 09/06/2019   CO2 27 09/06/2019   GLUCOSE 93 09/06/2019   BUN 11 09/06/2019   CREATININE 0.65 09/06/2019   BILITOT 0.3 09/06/2019   ALKPHOS 69 09/06/2019   AST 21 09/06/2019   ALT 18 09/06/2019   PROT 7.1 09/06/2019   ALBUMIN 4.3 09/06/2019   CALCIUM 9.8 09/06/2019   ANIONGAP 11 02/11/2018   Lab Results  Component Value Date   CHOL 170 09/06/2019   Lab Results  Component Value Date   HDL 64 09/06/2019   Lab Results  Component Value Date   LDLCALC 90 09/06/2019   Lab Results  Component Value Date   TRIG 87 09/06/2019   Lab Results  Component Value Date    CHOLHDL 2.7 09/06/2019   Lab Results  Component Value Date   HGBA1C 6.0 (H) 09/06/2019       Assessment & Plan:   Problem List Items Addressed This Visit      Musculoskeletal and Integument   Degenerative arthritis of right knee    AN INDIVIDUAL CARE PLAN was established and reinforced today.  The patient's status was assessed using clinical findings on exam, labs, and other diagnostic testing. Patient's success at meeting treatment goals based on disease specific evidence-bassed guidelines and found to be in fair control. RECOMMENDATIONS include maintaining present medicines and treatment.       Other Visit Diagnoses    Primary osteoarthritis of both knees    -  Primary    The right knee was injected with 1%xylcaine and kenalog per arthrocentesis.  Follow up one week for left knee. After consent was obtained, using sterile technique the right knee was prepped and plain Ethyl Chloride sPRAYwas used as local anesthetic. The joint was entered and 0 ml's of CLEAR colored fluid was withdrawn and not sent for analysis  Steroid 80 mg and 3 ml plain Lidocaine was then injected and the needle withdrawn.  The procedure was well tolerated.  The patient is asked to continue to rest the joint for a few more days before resuming regular activities.  It may be more painful for the first 1-2 days.  Watch for fever, or increased swelling or persistent pain in the joint. Call or return  to clinic prn if such symptoms occur or there is failure to improve as anticipated.  Meds ordered this encounter  Medications  . DISCONTD: triamcinolone acetonide (KENALOG-40) injection 40 mg  . triamcinolone acetonide (KENALOG-40) injection 80 mg     Reinaldo Meeker, MD

## 2019-09-20 ENCOUNTER — Other Ambulatory Visit: Payer: Self-pay

## 2019-09-20 ENCOUNTER — Other Ambulatory Visit: Payer: Self-pay | Admitting: Legal Medicine

## 2019-09-20 DIAGNOSIS — I4811 Longstanding persistent atrial fibrillation: Secondary | ICD-10-CM

## 2019-09-20 DIAGNOSIS — G2581 Restless legs syndrome: Secondary | ICD-10-CM

## 2019-09-20 MED ORDER — PRAMIPEXOLE DIHYDROCHLORIDE 1 MG PO TABS: 1.0000 mg | ORAL_TABLET | Freq: Every evening | ORAL | 2 refills | Status: DC

## 2019-09-20 MED ORDER — AMLODIPINE BESYLATE 5 MG PO TABS: 5.0000 mg | ORAL_TABLET | Freq: Every day | ORAL | 2 refills | Status: DC

## 2019-09-26 ENCOUNTER — Encounter: Payer: Self-pay | Admitting: Legal Medicine

## 2019-09-26 ENCOUNTER — Ambulatory Visit (INDEPENDENT_AMBULATORY_CARE_PROVIDER_SITE_OTHER): Payer: PPO | Admitting: Legal Medicine

## 2019-09-26 ENCOUNTER — Other Ambulatory Visit: Payer: Self-pay

## 2019-09-26 VITALS — BP 160/70 | HR 82 | Temp 97.5°F | Resp 18 | Ht 58.66 in | Wt 159.8 lb

## 2019-09-26 DIAGNOSIS — M1712 Unilateral primary osteoarthritis, left knee: Secondary | ICD-10-CM

## 2019-09-26 MED ORDER — CLONAZEPAM 0.5 MG PO TABS: 0.5000 mg | ORAL_TABLET | Freq: Every day | ORAL | 1 refills | Status: DC

## 2019-09-26 MED ORDER — TRIAMCINOLONE ACETONIDE 40 MG/ML IJ SUSP
80.0000 mg | Freq: Once | INTRAMUSCULAR | Status: AC
  Administered 2019-09-26: 80 mg via INTRA_ARTICULAR

## 2019-09-26 NOTE — Assessment & Plan Note (Signed)
Continued pain left knee.  Knee injected.  Patient improved and will follow up for chronic visit.

## 2019-09-26 NOTE — Progress Notes (Addendum)
Acute Office Visit  Subjective:    Patient ID: Sandra Lynch, female    DOB: 1935-05-05, 85 y.o.   MRN: [id number]  Chief Complaint  Patient presents with  . Left Knee pain    Pain on left knee    HPI Patient is in today for osteoarthritis left knee.  She is here for arthrocentesis.  She is still in wheel chair and having problems walking.  Restless leg syndrome: she is having worse RLS and requip and   Past Medical History:  Diagnosis Date  . Allergic rhinitis   . Anxiety   . Arthritis    "scattered in my joints" (02/11/2018)  . Atrial fibrillation ([place])   . Cough   . Essential hypertension   . GERD (gastroesophageal reflux disease)   . Hyperlipidemia   . Hyponatremia   . Pneumonia ~ 11/2017   walking pneumonia  . Pneumonia    "as a child"  . Urge incontinence     Past Surgical History:  Procedure Laterality Date  . APPENDECTOMY    . BUNIONECTOMY WITH HAMMERTOE RECONSTRUCTION Right 02/11/2018  . CARDIOVASCULAR STRESS TEST     01/25/18 [facility]): No reversible defects, fixed defect of mod-size in the septal wall mid ventricle to base, hypokinetic septal wall from mid ventricle to base, LVEF 50%, low risk  . HALLUX FUSION Right 02/11/2018   Procedure: HALLUX MPJ FUSION RIGHT FOOT;  Surgeon: [name], DPM;  Location: [place];  Service: Podiatry;  Laterality: Right;  . HAMMER TOE SURGERY Right 02/11/2018   Procedure: RIGHT HAMMER TOE CORRECTION SECOND, THIRD, FOURTH AND FIFTH;  Surgeon: [name], DPM;  Location: [place];  Service: Podiatry;  Laterality: Right;  . METATARSAL HEAD EXCISION Right 02/11/2018   Procedure: RIGHT PAN METATARSAL HEAD RESECTION;  Surgeon: [name], DPM;  Location: [place];  Service: Podiatry;  Laterality: Right;  . TOTAL ABDOMINAL HYSTERECTOMY    . TRANSTHORACIC ECHOCARDIOGRAM     01/24/18 [facility]): LV systolic function normal, EF 55-60%, impaired relaxation, LA mildly dilated, trace-mild MR, trace TR    Family  History  Problem Relation Age of Onset  . Pneumonia Mother   . Other Mother 48       Pneumonia  . Heart attack Father   . Hypertension Father   . Other Brother        Oncologist    Social History   Socioeconomic History  . Marital status: Widowed    Spouse name: Not on file  . Number of children: 2  . Years of education: Not on file  . Highest education level: Not on file  Occupational History  . Occupation: retired    Comment: Engineer, mining  Tobacco Use  . Smoking status: Never Smoker  . Smokeless tobacco: Never Used  Substance and Sexual Activity  . Alcohol use: Not Currently    Comment: 02/11/2018 "couple driinks/month"  . Drug use: Never  . Sexual activity: Not Currently  Other Topics Concern  . Not on file  Social History Narrative  . Not on file   Social Determinants of Health   Financial Resource Strain:   . Difficulty of Paying Living Expenses:   Food Insecurity:   . Worried About Charity fundraiser in the Last Year:   . Arboriculturist in the Last Year:   Transportation Needs:   . Film/video editor (Medical):   Marland Kitchen Lack of Transportation (Non-Medical):   Physical Activity:   .  Days of Exercise per Week:   . Minutes of Exercise per Session:   Stress:   . Feeling of Stress :   Social Connections:   . Frequency of Communication with Friends and Family:   . Frequency of Social Gatherings with Friends and Family:   . Attends Religious Services:   . Active Member of Clubs or Organizations:   . Attends Archivist Meetings:   Marland Kitchen Marital Status:   Intimate Partner Violence:   . Fear of Current or Ex-Partner:   . Emotionally Abused:   Marland Kitchen Physically Abused:   . Sexually Abused:     Outpatient Medications Prior to Visit  Medication Sig Dispense Refill  . alendronate (FOSAMAX) 70 MG tablet PLEASE SEE ATTACHED FOR DETAILED DIRECTIONS  3  . ALPRAZolam (XANAX) 0.25 MG tablet Take 1-2 tablets (0.25-0.5 mg total) by mouth 2 (two) times  daily as needed for anxiety. 2 tablet 0  . amLODipine (NORVASC) 5 MG tablet Take 1 tablet (5 mg total) by mouth daily. 90 tablet 2  . aspirin EC 81 MG tablet Take 1 tablet by mouth daily.    . Calcium Carbonate-Vitamin D (CALTRATE 600+D PO) Take 1 tablet by mouth daily.    . fluticasone (FLONASE) 50 MCG/ACT nasal spray Place 1 spray into both nostrils daily.  3  . furosemide (LASIX) 20 MG tablet Take 20 mg by mouth.    . Multiple Vitamins-Minerals (CENTRUM SILVER 50+WOMEN PO) Take 1 tablet by mouth daily.     Marland Kitchen MYRBETRIQ 25 MG TB24 tablet Take 25 mg by mouth daily.  4  . Omega-3 Fatty Acids (FISH OIL) 1000 MG CAPS Take 1 tablet by mouth daily.    Marland Kitchen omeprazole (PRILOSEC) 40 MG capsule Take 40 mg by mouth daily.    . pramoxine-hydrocortisone (PROCTOCREAM-HC) 1-1 % rectal cream APPLY TO AFFECTED AREA 3 TIMES A DAY    . pravastatin (PRAVACHOL) 40 MG tablet Take 40 mg by mouth at bedtime.  3  . sodium chloride (OCEAN) 0.65 % SOLN nasal spray Place 1 spray into both nostrils as needed for congestion.    . valsartan (DIOVAN) 40 MG tablet Take 40 mg by mouth daily. for blood pressure  0  . mirtazapine (REMERON) 15 MG tablet Take 15 mg by mouth at bedtime.    Marland Kitchen oxyCODONE-acetaminophen (PERCOCET) 10-325 MG tablet Take 1 tablet by mouth every 4 (four) hours as needed for pain. 20 tablet 0  . traMADol (ULTRAM) 50 MG tablet TAKE 1 TABLET EVERY 6 HOURS AS NEEDED FOR PAIN 30 tablet 0  . pramipexole (MIRAPEX) 1 MG tablet Take 1 tablet (1 mg total) by mouth at bedtime. (Patient not taking: Reported on 09/26/2019) 30 tablet 2   No facility-administered medications prior to visit.    Allergies  Allergen Reactions  . Pramipexole Other (See Comments)    She cannot walk or focus in anything.   Marland Kitchen Requip [Ropinirole] Shortness Of Breath  . Lisinopril Cough    Review of Systems  Constitutional: Negative.   HENT: Negative.   Eyes: Negative.   Respiratory: Negative.   Cardiovascular: Negative.     Gastrointestinal: Negative.   Genitourinary: Negative.   Musculoskeletal: Positive for joint swelling.  Skin: Negative.        Objective:    Physical Exam Vitals reviewed.  Constitutional:      Appearance: Normal appearance.  Cardiovascular:     Rate and Rhythm: Normal rate and regular rhythm.     Pulses: Normal pulses.  Heart sounds: Normal heart sounds.  Pulmonary:     Effort: Pulmonary effort is normal.     Breath sounds: Normal breath sounds.  Musculoskeletal:     Right knee: Tenderness present.     Left knee: Decreased range of motion. Tenderness present.     Comments: Severe osteoarthritis left knee with minimal swelling  Neurological:     Mental Status: She is alert.     BP (!) 160/70   Pulse 82   Temp (!) 97.5 F (36.4 C) (Temporal)   Resp 18   Ht 4' 10.66" (1.49 m)   Wt 159 lb 12.8 oz (72.5 kg)   SpO2 95%   BMI 32.65 kg/m  Wt Readings from Last 3 Encounters:  10/19/19 162 lb (73.5 kg)  09/26/19 159 lb 12.8 oz (72.5 kg)  09/06/19 161 lb (73 kg)    Health Maintenance Due  Topic Date Due  . FOOT EXAM  Never done  . OPHTHALMOLOGY EXAM  Never done  . MAMMOGRAM  03/29/2019    There are no preventive care reminders to display for this patient.   No results found for: TSH Lab Results  Component Value Date   WBC 6.4 09/06/2019   HGB 13.8 09/06/2019   HCT 42.1 09/06/2019   MCV 90 09/06/2019   PLT 350 09/06/2019   Lab Results  Component Value Date   NA 139 09/06/2019   K 4.4 09/06/2019   CO2 27 09/06/2019   GLUCOSE 93 09/06/2019   BUN 11 09/06/2019   CREATININE 0.65 09/06/2019   BILITOT 0.3 09/06/2019   ALKPHOS 69 09/06/2019   AST 21 09/06/2019   ALT 18 09/06/2019   PROT 7.1 09/06/2019   ALBUMIN 4.3 09/06/2019   CALCIUM 9.8 09/06/2019   ANIONGAP 11 02/11/2018   Lab Results  Component Value Date   CHOL 170 09/06/2019   Lab Results  Component Value Date   HDL 64 09/06/2019   Lab Results  Component Value Date   LDLCALC 90  09/06/2019   Lab Results  Component Value Date   TRIG 87 09/06/2019   Lab Results  Component Value Date   CHOLHDL 2.7 09/06/2019   Lab Results  Component Value Date   HGBA1C 6.0 (H) 09/06/2019       Assessment & Plan:   Problem List Items Addressed This Visit      Musculoskeletal and Integument   Degenerative arthritis of left knee - Primary (Chronic)    Continued pain left knee.  Knee injected.  Patient improved and will follow up for chronic visit.          Meds ordered this encounter  Medications  . DISCONTD: clonazePAM (KLONOPIN) 0.5 MG tablet    Sig: Take 1 tablet (0.5 mg total) by mouth at bedtime.    Dispense:  30 tablet    Refill:  1  . triamcinolone acetonide (KENALOG-40) injection 80 mg     Reinaldo Meeker, MD

## 2019-09-28 DIAGNOSIS — R262 Difficulty in walking, not elsewhere classified: Secondary | ICD-10-CM | POA: Diagnosis not present

## 2019-09-28 DIAGNOSIS — Z9181 History of falling: Secondary | ICD-10-CM | POA: Diagnosis not present

## 2019-09-28 DIAGNOSIS — M25511 Pain in right shoulder: Secondary | ICD-10-CM | POA: Diagnosis not present

## 2019-09-28 DIAGNOSIS — M25512 Pain in left shoulder: Secondary | ICD-10-CM | POA: Diagnosis not present

## 2019-09-28 NOTE — Addendum Note (Signed)
Addended by: Reinaldo Meeker on: 09/28/2019 04:57 AM   Modules accepted: Orders

## 2019-10-02 ENCOUNTER — Other Ambulatory Visit: Payer: Self-pay | Admitting: Legal Medicine

## 2019-10-05 DIAGNOSIS — M25511 Pain in right shoulder: Secondary | ICD-10-CM | POA: Diagnosis not present

## 2019-10-05 DIAGNOSIS — Z9181 History of falling: Secondary | ICD-10-CM | POA: Diagnosis not present

## 2019-10-05 DIAGNOSIS — M25512 Pain in left shoulder: Secondary | ICD-10-CM | POA: Diagnosis not present

## 2019-10-05 DIAGNOSIS — R262 Difficulty in walking, not elsewhere classified: Secondary | ICD-10-CM | POA: Diagnosis not present

## 2019-10-11 ENCOUNTER — Other Ambulatory Visit: Payer: Self-pay | Admitting: Legal Medicine

## 2019-10-11 DIAGNOSIS — M2041 Other hammer toe(s) (acquired), right foot: Secondary | ICD-10-CM

## 2019-10-12 DIAGNOSIS — M25512 Pain in left shoulder: Secondary | ICD-10-CM | POA: Diagnosis not present

## 2019-10-12 DIAGNOSIS — R262 Difficulty in walking, not elsewhere classified: Secondary | ICD-10-CM | POA: Diagnosis not present

## 2019-10-12 DIAGNOSIS — M25511 Pain in right shoulder: Secondary | ICD-10-CM | POA: Diagnosis not present

## 2019-10-12 DIAGNOSIS — Z9181 History of falling: Secondary | ICD-10-CM | POA: Diagnosis not present

## 2019-10-18 ENCOUNTER — Other Ambulatory Visit: Payer: Self-pay | Admitting: Legal Medicine

## 2019-10-18 ENCOUNTER — Telehealth: Payer: Self-pay

## 2019-10-18 NOTE — Telephone Encounter (Signed)
Patients caregiver called in stating that she is not doing well on clonazipam and that patient does not take it anymore at night before bed. Patient gets drowsy and combative with her help when getting into bed and sleepy in the mornings. But patient does well on the one xanax in the morning and one xanax at night.

## 2019-10-19 ENCOUNTER — Encounter: Payer: Self-pay | Admitting: Legal Medicine

## 2019-10-19 ENCOUNTER — Ambulatory Visit (INDEPENDENT_AMBULATORY_CARE_PROVIDER_SITE_OTHER): Payer: PPO | Admitting: Legal Medicine

## 2019-10-19 ENCOUNTER — Other Ambulatory Visit: Payer: Self-pay

## 2019-10-19 VITALS — BP 140/78 | HR 87 | Temp 97.8°F | Ht 61.0 in | Wt 162.0 lb

## 2019-10-19 DIAGNOSIS — B070 Plantar wart: Secondary | ICD-10-CM | POA: Insufficient documentation

## 2019-10-19 DIAGNOSIS — M1711 Unilateral primary osteoarthritis, right knee: Secondary | ICD-10-CM

## 2019-10-19 MED ORDER — TRIAMCINOLONE ACETONIDE 40 MG/ML IJ SUSP
80.0000 mg | Freq: Once | INTRAMUSCULAR | Status: AC
  Administered 2019-10-19: 80 mg via INTRA_ARTICULAR

## 2019-10-19 MED ORDER — ALBUTEROL SULFATE HFA 108 (90 BASE) MCG/ACT IN AERS: 2.0000 | INHALATION_SPRAY | Freq: Four times a day (QID) | RESPIRATORY_TRACT | 2 refills | Status: AC | PRN

## 2019-10-19 MED ORDER — AZITHROMYCIN 500 MG PO TABS: 500.0000 mg | ORAL_TABLET | Freq: Every day | ORAL | 0 refills | Status: DC

## 2019-10-19 NOTE — Patient Instructions (Signed)

## 2019-10-19 NOTE — Assessment & Plan Note (Signed)
Patient has severe right knee osteoarthritis.  The knee was injected with 80mg  kenalog by arthrocentesis.

## 2019-10-19 NOTE — Progress Notes (Signed)
Established Patient Office Visit  Subjective:  Patient ID: Sandra Lynch, female    DOB: 09/18/1934  Age: 85 y.o. MRN: [id number]  CC:  Chief Complaint  Patient presents with  . Knee Pain    right knee  . Foot Pain    left foot pain    HPI QUINTERA BURKLE presents for plantar wart left foot.  It is over metatarsals.    Patient is having cough and wheezing for one week.  No fever or chills, it is worse at night.  No history of copd or asthma.    She  Is having more pain in right knee from osteoarthritis, Last injection 1/21 with partial relief.  She has some giving out of right knee when she walks.    Past Medical History:  Diagnosis Date  . Allergic rhinitis   . Anxiety   . Arthritis    "scattered in my joints" (02/11/2018)  . Atrial fibrillation ([place])   . Cough   . Essential hypertension   . GERD (gastroesophageal reflux disease)   . Hyperlipidemia   . Hyponatremia   . Pneumonia ~ 11/2017   walking pneumonia  . Pneumonia    "as a child"  . Urge incontinence     Past Surgical History:  Procedure Laterality Date  . APPENDECTOMY    . BUNIONECTOMY WITH HAMMERTOE RECONSTRUCTION Right 02/11/2018  . CARDIOVASCULAR STRESS TEST     01/25/18 [facility]): No reversible defects, fixed defect of mod-size in the septal wall mid ventricle to base, hypokinetic septal wall from mid ventricle to base, LVEF 50%, low risk  . HALLUX FUSION Right 02/11/2018   Procedure: HALLUX MPJ FUSION RIGHT FOOT;  Surgeon: [name], DPM;  Location: [place];  Service: Podiatry;  Laterality: Right;  . HAMMER TOE SURGERY Right 02/11/2018   Procedure: RIGHT HAMMER TOE CORRECTION SECOND, THIRD, FOURTH AND FIFTH;  Surgeon: [name], DPM;  Location: [place];  Service: Podiatry;  Laterality: Right;  . METATARSAL HEAD EXCISION Right 02/11/2018   Procedure: RIGHT PAN METATARSAL HEAD RESECTION;  Surgeon: [name], DPM;  Location: [place];  Service: Podiatry;  Laterality: Right;  . TOTAL  ABDOMINAL HYSTERECTOMY    . TRANSTHORACIC ECHOCARDIOGRAM     01/24/18 [facility]): LV systolic function normal, EF 55-60%, impaired relaxation, LA mildly dilated, trace-mild MR, trace TR    Family History  Problem Relation Age of Onset  . Pneumonia Mother   . Other Mother 67       Pneumonia  . Heart attack Father   . Hypertension Father   . Other Brother        Oncologist    Social History   Socioeconomic History  . Marital status: Widowed    Spouse name: Not on file  . Number of children: 2  . Years of education: Not on file  . Highest education level: Not on file  Occupational History  . Occupation: retired    Comment: Engineer, mining  Tobacco Use  . Smoking status: Never Smoker  . Smokeless tobacco: Never Used  Substance and Sexual Activity  . Alcohol use: Not Currently    Comment: 02/11/2018 "couple driinks/month"  . Drug use: Never  . Sexual activity: Not Currently  Other Topics Concern  . Not on file  Social History Narrative  . Not on file   Social Determinants of Health   Financial Resource Strain:   . Difficulty of Paying Living Expenses:   Food  Insecurity:   . Worried About Charity fundraiser in the Last Year:   . Arboriculturist in the Last Year:   Transportation Needs:   . Film/video editor (Medical):   Marland Kitchen Lack of Transportation (Non-Medical):   Physical Activity:   . Days of Exercise per Week:   . Minutes of Exercise per Session:   Stress:   . Feeling of Stress :   Social Connections:   . Frequency of Communication with Friends and Family:   . Frequency of Social Gatherings with Friends and Family:   . Attends Religious Services:   . Active Member of Clubs or Organizations:   . Attends Archivist Meetings:   Marland Kitchen Marital Status:   Intimate Partner Violence:   . Fear of Current or Ex-Partner:   . Emotionally Abused:   Marland Kitchen Physically Abused:   . Sexually Abused:     Outpatient Medications Prior to Visit  Medication  Sig Dispense Refill  . alendronate (FOSAMAX) 70 MG tablet PLEASE SEE ATTACHED FOR DETAILED DIRECTIONS  3  . ALPRAZolam (XANAX) 0.25 MG tablet Take 1-2 tablets (0.25-0.5 mg total) by mouth 2 (two) times daily as needed for anxiety. 2 tablet 0  . amLODipine (NORVASC) 5 MG tablet Take 1 tablet (5 mg total) by mouth daily. 90 tablet 2  . aspirin EC 81 MG tablet Take 1 tablet by mouth daily.    . Calcium Carbonate-Vitamin D (CALTRATE 600+D PO) Take 1 tablet by mouth daily.    . fluticasone (FLONASE) 50 MCG/ACT nasal spray Place 1 spray into both nostrils daily.  3  . furosemide (LASIX) 20 MG tablet Take 20 mg by mouth.    . mirtazapine (REMERON) 15 MG tablet TAKE 1 TABLET BY MOUTH ONCE DAILY BEFORE BEDTIME 90 tablet 2  . Multiple Vitamins-Minerals (CENTRUM SILVER 50+WOMEN PO) Take 1 tablet by mouth daily.     Marland Kitchen MYRBETRIQ 25 MG TB24 tablet Take 25 mg by mouth daily.  4  . Omega-3 Fatty Acids (FISH OIL) 1000 MG CAPS Take 1 tablet by mouth daily.    Marland Kitchen omeprazole (PRILOSEC) 40 MG capsule Take 40 mg by mouth daily.    . pramoxine-hydrocortisone (PROCTOCREAM-HC) 1-1 % rectal cream APPLY TO AFFECTED AREA 3 TIMES A DAY    . pravastatin (PRAVACHOL) 40 MG tablet Take 40 mg by mouth at bedtime.  3  . sodium chloride (OCEAN) 0.65 % SOLN nasal spray Place 1 spray into both nostrils as needed for congestion.    . traMADol (ULTRAM) 50 MG tablet TAKE 1 TABLET BY MOUTH EVERY 6 HOURS AS NEEDED 120 tablet 2  . valsartan (DIOVAN) 40 MG tablet Take 40 mg by mouth daily. for blood pressure  0   No facility-administered medications prior to visit.    Allergies  Allergen Reactions  . Pramipexole Other (See Comments)    She cannot walk or focus in anything.   Marland Kitchen Requip [Ropinirole] Shortness Of Breath  . Lisinopril Cough    ROS Review of Systems  Constitutional: Negative.   HENT: Negative.   Cardiovascular: Negative.   Gastrointestinal: Negative.   Genitourinary: Negative.   Musculoskeletal: Positive for  arthralgias.       Patient is wheel chair here  Neurological: Negative.   Psychiatric/Behavioral: Negative.       Objective:    Physical Exam  Constitutional: She appears well-developed and well-nourished.  HENT:  Head: Normocephalic and atraumatic.  Eyes: Pupils are equal, round, and reactive to light. Conjunctivae  are normal.  Cardiovascular: Normal rate, regular rhythm and normal heart sounds.  Pulmonary/Chest: She has wheezes.  Abdominal: Soft. Bowel sounds are normal.  Musculoskeletal:     Right knee: Deformity and effusion present. Tenderness present.     Comments: Severe arthritis right knee  Skin:  Plantar wart over left metatarsals.  Pared down.  Vitals reviewed.   BP 140/78   Pulse 87   Temp 97.8 F (36.6 C)   Ht 5\' 1"  (1.549 m)   Wt 162 lb (73.5 kg)   SpO2 97%   BMI 30.61 kg/m  Wt Readings from Last 3 Encounters:  10/19/19 162 lb (73.5 kg)  09/26/19 159 lb 12.8 oz (72.5 kg)  09/06/19 161 lb (73 kg)     Health Maintenance Due  Topic Date Due  . FOOT EXAM  Never done  . OPHTHALMOLOGY EXAM  Never done  . MAMMOGRAM  03/29/2019    There are no preventive care reminders to display for this patient.  No results found for: TSH Lab Results  Component Value Date   WBC 6.4 09/06/2019   HGB 13.8 09/06/2019   HCT 42.1 09/06/2019   MCV 90 09/06/2019   PLT 350 09/06/2019   Lab Results  Component Value Date   NA 139 09/06/2019   K 4.4 09/06/2019   CO2 27 09/06/2019   GLUCOSE 93 09/06/2019   BUN 11 09/06/2019   CREATININE 0.65 09/06/2019   BILITOT 0.3 09/06/2019   ALKPHOS 69 09/06/2019   AST 21 09/06/2019   ALT 18 09/06/2019   PROT 7.1 09/06/2019   ALBUMIN 4.3 09/06/2019   CALCIUM 9.8 09/06/2019   ANIONGAP 11 02/11/2018   Lab Results  Component Value Date   CHOL 170 09/06/2019   Lab Results  Component Value Date   HDL 64 09/06/2019   Lab Results  Component Value Date   LDLCALC 90 09/06/2019   Lab Results  Component Value Date   TRIG  87 09/06/2019   Lab Results  Component Value Date   CHOLHDL 2.7 09/06/2019   Lab Results  Component Value Date   HGBA1C 6.0 (H) 09/06/2019      Assessment & Plan:   Problem List Items Addressed This Visit      Musculoskeletal and Integument   Degenerative arthritis of right knee - Primary    Patient has severe right knee osteoarthritis.  The knee was injected with 80mg  kenalog by arthrocentesis.      Relevant Orders   Arthrocentesis   Plantar wart of left foot    Plantar wart was pared down sharply. Lesion 1cm   No bleeding.  She will soak foot and use pummace stone to keep shaved down.      Relevant Medications   azithromycin (ZITHROMAX) 500 MG tablet      Meds ordered this encounter  Medications  . triamcinolone acetonide (KENALOG-40) injection 80 mg  . azithromycin (ZITHROMAX) 500 MG tablet    Sig: Take 1 tablet (500 mg total) by mouth daily.    Dispense:  5 tablet    Refill:  0  . albuterol (VENTOLIN HFA) 108 (90 Base) MCG/ACT inhaler    Sig: Inhale 2 puffs into the lungs every 6 (six) hours as needed for wheezing or shortness of breath.    Dispense:  8 g    Refill:  2    Follow-up: Return in about 1 month (around 11/19/2019), or for right shoulder.    Reinaldo Meeker, MD

## 2019-10-19 NOTE — Assessment & Plan Note (Signed)
Plantar wart was pared down sharply. Lesion 1cm   No bleeding.  She will soak foot and use pummace stone to keep shaved down.

## 2019-10-20 NOTE — Addendum Note (Signed)
Addended by: Reinaldo Meeker on: 10/20/2019 04:55 PM   Modules accepted: Orders

## 2019-10-20 NOTE — Progress Notes (Signed)
We fixed this one lp

## 2019-10-24 NOTE — Addendum Note (Signed)
Addended by: Reinaldo Meeker on: 10/24/2019 11:30 AM   Modules accepted: Orders

## 2019-10-24 NOTE — Assessment & Plan Note (Signed)
AN INDIVIDUAL CARE PLAN was established and reinforced today.  The patient's status was assessed using clinical findings on exam, labs, and other diagnostic testing. Patient's success at meeting treatment goals based on disease specific evidence-bassed guidelines and found to be in fair control. RECOMMENDATIONS include maintaining present medicines and treatment. 

## 2019-10-24 NOTE — Addendum Note (Signed)
Addended by: Reinaldo Meeker on: 10/24/2019 01:07 PM   Modules accepted: Orders

## 2019-10-27 ENCOUNTER — Telehealth: Payer: Self-pay | Admitting: Legal Medicine

## 2019-10-27 ENCOUNTER — Other Ambulatory Visit: Payer: Self-pay | Admitting: Legal Medicine

## 2019-10-27 NOTE — Progress Notes (Signed)
  Chronic Care Management   Note  10/27/2019 Name: Sandra Lynch MRN: [id number] DOB: 01-20-1935  [name] is a 85 y.o. year old female who is a primary care patient of Lillard Anes, MD. I reached out to [name] by phone today in response to a referral sent by [name]'s PCP, Lillard Anes, MD.   [name] was given information about Chronic Care Management services today including:  1. CCM service includes personalized support from designated clinical staff supervised by her physician, including individualized plan of care and coordination with other care providers 2. 24/7 contact phone numbers for assistance for urgent and routine care needs. 3. Service will only be billed when office clinical staff spend 20 minutes or more in a month to coordinate care. 4. Only one practitioner may furnish and bill the service in a calendar month. 5. The patient may stop CCM services at any time (effective at the end of the month) by phone call to the office staff.   Patient agreed to services and verbal consent obtained.   Follow up plan:   Earney [facility]

## 2019-10-28 ENCOUNTER — Other Ambulatory Visit: Payer: Self-pay | Admitting: Legal Medicine

## 2019-11-02 DIAGNOSIS — M25511 Pain in right shoulder: Secondary | ICD-10-CM | POA: Diagnosis not present

## 2019-11-02 DIAGNOSIS — R262 Difficulty in walking, not elsewhere classified: Secondary | ICD-10-CM | POA: Diagnosis not present

## 2019-11-02 DIAGNOSIS — Z9181 History of falling: Secondary | ICD-10-CM | POA: Diagnosis not present

## 2019-11-02 DIAGNOSIS — M25512 Pain in left shoulder: Secondary | ICD-10-CM | POA: Diagnosis not present

## 2019-11-08 NOTE — Chronic Care Management (AMB) (Signed)
Chronic Care Management Pharmacy  Name: Sandra Lynch  MRN: [id number] DOB: [birth year]  Chief Complaint/ HPI  Sandra Lynch,  85 y.o. , female presents for their Initial CCM visit with the clinical pharmacist via telephone due to COVID-19 Pandemic.  PCP : Lillard Anes, MD  Their chronic conditions include: HTN, Afib, DM, Dementia, mixed incontinence, Osteoporosis, Restless Leg, Anxiety, HLD, Major Depressive Disorder.  Office Visits: 10/19/2019 - cough and wheezing x 1 week. Short-term azithromycin and albuterol inhlaer prn.  09/26/2019 - Knee pain - shot in knee 09/19/2019 - d/c ropinirole per patient.  09/06/2019 - added ropinirole for RLS. Changed omeprazole from 20-40 mg daily. 06/26/2020 - Knee injection 06/07/2020 - hemorrhoids given cream.   Consult Visit:n/a  Medications: Outpatient Encounter Medications as of 11/09/2019  Medication Sig  . alendronate (FOSAMAX) 70 MG tablet Take 70 mg by mouth once a week.   . ALPRAZolam (XANAX) 0.25 MG tablet Take 1-2 tablets (0.25-0.5 mg total) by mouth 2 (two) times daily as needed for anxiety. (Patient taking differently: Take 0.25-0.5 mg by mouth at bedtime. )  . amLODipine (NORVASC) 5 MG tablet Take 1 tablet (5 mg total) by mouth daily. (Patient taking differently: Take 5 mg by mouth every evening. )  . aspirin EC 81 MG tablet Take 1 tablet by mouth daily.  . Calcium Carbonate-Vitamin D (CALTRATE 600+D PO) Take 1 tablet by mouth daily.  . fluticasone (FLONASE) 50 MCG/ACT nasal spray Place 1 spray into both nostrils daily.  . furosemide (LASIX) 20 MG tablet Take 20 mg by mouth in the morning.   . mirtazapine (REMERON) 15 MG tablet TAKE 1 TABLET BY MOUTH ONCE DAILY BEFORE BEDTIME  . Multiple Vitamins-Minerals (CENTRUM SILVER 50+WOMEN PO) Take 1 tablet by mouth daily.   Marland Kitchen MYRBETRIQ 25 MG TB24 tablet TAKE 1 TABLET BY MOUTH EVERY DAY  . Omega-3 Fatty Acids (FISH OIL) 1200 MG CAPS Take 1 tablet by mouth daily.   Marland Kitchen  omeprazole (PRILOSEC) 40 MG capsule Take 40 mg by mouth daily.  . pramoxine-hydrocortisone (PROCTOCREAM-HC) 1-1 % rectal cream APPLY TO AFFECTED AREA 3 TIMES A DAY  . pravastatin (PRAVACHOL) 40 MG tablet Take 40 mg by mouth at bedtime.  . traMADol (ULTRAM) 50 MG tablet TAKE 1 TABLET BY MOUTH EVERY 6 HOURS AS NEEDED (Patient taking differently: Take 50 mg by mouth at bedtime. )  . valsartan (DIOVAN) 40 MG tablet TAKE 1 TABLET BY MOUTH DAILY FOR BLOOD PRESSURE  . albuterol (VENTOLIN HFA) 108 (90 Base) MCG/ACT inhaler Inhale 2 puffs into the lungs every 6 (six) hours as needed for wheezing or shortness of breath. (Patient not taking: Reported on 11/09/2019)  . azithromycin (ZITHROMAX) 500 MG tablet Take 1 tablet (500 mg total) by mouth daily.  . sodium chloride (OCEAN) 0.65 % SOLN nasal spray Place 1 spray into both nostrils as needed for congestion.   No facility-administered encounter medications on file as of 11/09/2019.   Allergies  Allergen Reactions  . Pramipexole Other (See Comments)    She cannot walk or focus in anything.   Marland Kitchen Requip [Ropinirole] Shortness Of Breath  . Lisinopril Cough    SDOH Screenings   Alcohol Screen: Low Risk   . Last Alcohol Screening Score (AUDIT): 0  Depression (PHQ2-9):   . PHQ-2 Score:   Financial Resource Strain:   . Difficulty of Paying Living Expenses:   Food Insecurity: No Food Insecurity  . Worried About Charity fundraiser in the Last Year: Never  true  . Ran Out of Food in the Last Year: Never true  Housing: Low Risk   . Last Housing Risk Score: 0  Physical Activity:   . Days of Exercise per Week:   . Minutes of Exercise per Session:   Social Connections:   . Frequency of Communication with Friends and Family:   . Frequency of Social Gatherings with Friends and Family:   . Attends Religious Services:   . Active Member of Clubs or Organizations:   . Attends Archivist Meetings:   Marland Kitchen Marital Status:   Stress:   . Feeling of Stress  :   Tobacco Use: Low Risk   . Smoking Tobacco Use: Never Smoker  . Smokeless Tobacco Use: Never Used  Transportation Needs: No Transportation Needs  . Lack of Transportation (Medical): No  . Lack of Transportation (Non-Medical): No     Current Diagnosis/Assessment:  Goals Addressed   None     Diabetes   Recent Relevant Labs: Lab Results  Component Value Date/Time   HGBA1C 6.0 (H) 09/06/2019 12:31 PM     Checking BG: Never  Patient is currently controlled on the following medications: diet controlled  We discussed: diet and exercise extensively  Plan  Continue control with diet and exercise,  Hypertension/Afib   Office blood pressures are  BP Readings from Last 3 Encounters:  10/19/19 140/78  09/26/19 (!) 160/70  09/19/19 136/80    Patient has failed these meds in the past: lisinopril Patient is currently controlledon the following medications: valsartan, furosemide, amlodipine  Patient checks BP at home infrequently  Patient home BP readings are ranging: not really checking  We discussed diet and exercise extensively. Patient indicates that her caregivers fix her meat and vegetables or meat and salads for meals. She enjoys an occasional milk shake but otherwise eats healthy.  Plan  Continue current medications   Hyperlipidemia   Lipid Panel     Component Value Date/Time   CHOL 170 09/06/2019 1231   TRIG 87 09/06/2019 1231   HDL 64 09/06/2019 1231   CHOLHDL 2.7 09/06/2019 1231   LDLCALC 90 09/06/2019 1231   LABVLDL 16 09/06/2019 1231     The ASCVD Risk score (Goff DC Jr., et al., 2013) failed to calculate for the following reasons:   The 2013 ASCVD risk score is only valid for ages 49 to 37   The patient has a prior MI or stroke diagnosis   Patient has failed these meds in past: none indicated Patient is currently controlled on the following medications: pravastatin 40 mg tablet, omega 3 1200 mg capsules  We discussed:  diet and exercise  extensively  Plan  Continue current medications   Osteoporosis   Patient has failed these meds in past: n/a Patient is currently controlled on the following medications: alendronate 70 mg, calcium carbonate 600 with d daily   We discussed:  Discussed medications and how she takes.   Plan  Continue current medications    and  Anxiety/Depression:   Patient has failed these meds in past: clonazepam Patient is currently controlled on the following medications: alprazolam, mirtazapine  We discussed:  Patient is only taking alprazolam at night right now.   Plan  Continue current medications   Gastritis   Patient has failed these meds in past: omeprazole 20 mg daily prn Patient is currently controlled on the following medications: omeprazole 40 mg capsule daily  We discussed:  diet and exercise extensively  Plan  Continue current medications  Mixed incontinence   Patient has failed these meds in past: none noted by patient Patient is currently controlled on the following medications: Myrbetriq 25 mg  We discussed:  Patient indicates that medication is expensive and driving her in the doughnut hole.   Plan Continue current medications   Health Maintenance   Patient is currently controlled on the following medications:  Centrum Silver 50+ Women's - general health Proctocream-HC - hemorrhoids Ocean spray/Flonase - prn nasal congestion Mucinex 1200 mg - pm for congestion Alleve and tramadol - knee pain at bedtime   We discussed:  Patient overall pleased with medications.   Plan  Continue current medications  Vaccines   Reviewed and discussed patient's vaccination history.    Immunization History  Administered Date(s) Administered  . Influenza,trivalent, recombinat, inj, PF 05/06/2010, 06/14/2010, 05/12/2011, 05/18/2012, 04/06/2015, 05/09/2016, 06/02/2017  . Influenza-Unspecified 04/27/2018  . Pneumococcal Conjugate-13 10/10/2013  . Pneumococcal  Polysaccharide-23 11/05/2010  . Tdap 03/10/2011    Plan  Recommended patient receive annual flu vaccine in office.  Medication Management   Pt uses [facility] for all medications Caregivers fill up her pill box Pt endorses excellent compliance  We discussed: Caregivers are administering medications and organizing them. Patient is interested in synchronization and delivery.  Plan  Verbal consent obtained for [facility] enhanced pharmacy services (medication synchronization, adherence packaging, delivery coordination). A medication sync plan was created to allow patient to get all medications delivered once every 30 to 90 days per patient preference. Patient understands they have freedom to choose pharmacy and clinical pharmacist will coordinate care between all prescribers and [facility].  Follow up: 3 months

## 2019-11-09 ENCOUNTER — Other Ambulatory Visit: Payer: Self-pay

## 2019-11-09 ENCOUNTER — Ambulatory Visit: Payer: PPO

## 2019-11-09 DIAGNOSIS — M2041 Other hammer toe(s) (acquired), right foot: Secondary | ICD-10-CM

## 2019-11-09 DIAGNOSIS — I10 Essential (primary) hypertension: Secondary | ICD-10-CM

## 2019-11-09 DIAGNOSIS — E782 Mixed hyperlipidemia: Secondary | ICD-10-CM

## 2019-11-09 NOTE — Patient Instructions (Signed)
Visit Information  Thank you for your time discussing your medications. I look forward to working with you to achieve your health care goals. Below is a summary of what we talked about during our visit.   Goals Addressed   None     Sandra Lynch was given information about Chronic Care Management services today including:  1. CCM service includes personalized support from designated clinical staff supervised by her physician, including individualized plan of care and coordination with other care providers 2. 24/7 contact phone numbers for assistance for urgent and routine care needs. 3. Standard insurance, coinsurance, copays and deductibles apply for chronic care management only during months in which we provide at least 20 minutes of these services. Most insurances cover these services at 100%, however patients may be responsible for any copay, coinsurance and/or deductible if applicable. This service may help you avoid the need for more expensive face-to-face services. 4. Only one practitioner may furnish and bill the service in a calendar month. 5. The patient may stop CCM services at any time (effective at the end of the month) by phone call to the office staff.  Patient agreed to services and verbal consent obtained.   Print copy of patient instructions provided.  Telephone follow up appointment with pharmacy team member scheduled for:  Sandra Lynch, PharmD Clinical Pharmacist [facility] [phone]  DASH Eating Plan DASH stands for "Dietary Approaches to Stop Hypertension." The DASH eating plan is a healthy eating plan that has been shown to reduce high blood pressure (hypertension). It may also reduce your risk for type 2 diabetes, heart disease, and stroke. The DASH eating plan may also help with weight loss. What are tips for following this plan?  General guidelines  Avoid eating more than 2,300 mg (milligrams) of salt (sodium) a day. If you have hypertension, you  may need to reduce your sodium intake to 1,500 mg a day.  Limit alcohol intake to no more than 1 drink a day for nonpregnant women and 2 drinks a day for men. One drink equals 12 oz of beer, 5 oz of wine, or 1 oz of hard liquor.  Work with your health care provider to maintain a healthy body weight or to lose weight. Ask what an ideal weight is for you.  Get at least 30 minutes of exercise that causes your heart to beat faster (aerobic exercise) most days of the week. Activities may include walking, swimming, or biking.  Work with your health care provider or diet and nutrition specialist (dietitian) to adjust your eating plan to your individual calorie needs. Reading food labels   Check food labels for the amount of sodium per serving. Choose foods with less than 5 percent of the Daily Value of sodium. Generally, foods with less than 300 mg of sodium per serving fit into this eating plan.  To find whole grains, look for the word "whole" as the first word in the ingredient list. Shopping  Buy products labeled as "low-sodium" or "no salt added."  Buy fresh foods. Avoid canned foods and premade or frozen meals. Cooking  Avoid adding salt when cooking. Use salt-free seasonings or herbs instead of table salt or sea salt. Check with your health care provider or pharmacist before using salt substitutes.  Do not fry foods. Cook foods using healthy methods such as baking, boiling, grilling, and broiling instead.  Cook with heart-healthy oils, such as olive, canola, soybean, or sunflower oil. Meal planning  Eat a balanced diet  that includes: ? 5 or more servings of fruits and vegetables each day. At each meal, try to fill half of your plate with fruits and vegetables. ? Up to 6-8 servings of whole grains each day. ? Less than 6 oz of lean meat, poultry, or fish each day. A 3-oz serving of meat is about the same size as a deck of cards. One egg equals 1 oz. ? 2 servings of low-fat dairy each  day. ? A serving of nuts, seeds, or beans 5 times each week. ? Heart-healthy fats. Healthy fats called Omega-3 fatty acids are found in foods such as flaxseeds and coldwater fish, like sardines, salmon, and mackerel.  Limit how much you eat of the following: ? Canned or prepackaged foods. ? Food that is high in trans fat, such as fried foods. ? Food that is high in saturated fat, such as fatty meat. ? Sweets, desserts, sugary drinks, and other foods with added sugar. ? Full-fat dairy products.  Do not salt foods before eating.  Try to eat at least 2 vegetarian meals each week.  Eat more home-cooked food and less restaurant, buffet, and fast food.  When eating at a restaurant, ask that your food be prepared with less salt or no salt, if possible. What foods are recommended? The items listed may not be a complete list. Talk with your dietitian about what dietary choices are best for you. Grains Whole-grain or whole-wheat bread. Whole-grain or whole-wheat pasta. Sandra Lynch rice. Sandra Lynch. Bulgur. Whole-grain and low-sodium cereals. Pita bread. Low-fat, low-sodium crackers. Whole-wheat flour tortillas. Vegetables Fresh or frozen vegetables (raw, steamed, roasted, or grilled). Low-sodium or reduced-sodium tomato and vegetable juice. Low-sodium or reduced-sodium tomato sauce and tomato paste. Low-sodium or reduced-sodium canned vegetables. Fruits All fresh, dried, or frozen fruit. Canned fruit in natural juice (without added sugar). Meat and other protein foods Skinless chicken or [place]. Ground chicken or [place]. Pork with fat trimmed off. Fish and seafood. Egg whites. Dried beans, peas, or lentils. Unsalted nuts, nut butters, and seeds. Unsalted canned beans. Lean cuts of beef with fat trimmed off. Low-sodium, lean deli meat. Dairy Low-fat (1%) or fat-free (skim) milk. Fat-free, low-fat, or reduced-fat cheeses. Nonfat, low-sodium ricotta or cottage cheese. Low-fat or nonfat yogurt.  Low-fat, low-sodium cheese. Fats and oils Soft margarine without trans fats. Vegetable oil. Low-fat, reduced-fat, or light mayonnaise and salad dressings (reduced-sodium). Canola, safflower, olive, soybean, and sunflower oils. Avocado. Seasoning and other foods Herbs. Spices. Seasoning mixes without salt. Unsalted popcorn and pretzels. Fat-free sweets. What foods are not recommended? The items listed may not be a complete list. Talk with your dietitian about what dietary choices are best for you. Grains Baked goods made with fat, such as croissants, muffins, or some breads. Dry pasta or rice meal packs. Vegetables Creamed or fried vegetables. Vegetables in a cheese sauce. Regular canned vegetables (not low-sodium or reduced-sodium). Regular canned tomato sauce and paste (not low-sodium or reduced-sodium). Regular tomato and vegetable juice (not low-sodium or reduced-sodium). Sandra Lynch. Olives. Fruits Canned fruit in a light or heavy syrup. Fried fruit. Fruit in cream or butter sauce. Meat and other protein foods Fatty cuts of meat. Ribs. Fried meat. Sandra Lynch. Sausage. Bologna and other processed lunch meats. Salami. Fatback. Hotdogs. Bratwurst. Salted nuts and seeds. Canned beans with added salt. Canned or smoked fish. Whole eggs or egg yolks. Chicken or [place] with skin. Dairy Whole or 2% milk, cream, and half-and-half. Whole or full-fat cream cheese. Whole-fat or sweetened yogurt. Full-fat cheese. Nondairy creamers.  Whipped toppings. Processed cheese and cheese spreads. Fats and oils Butter. Stick margarine. Lard. Shortening. Ghee. Bacon fat. Tropical oils, such as coconut, palm kernel, or palm oil. Seasoning and other foods Salted popcorn and pretzels. Onion salt, garlic salt, seasoned salt, table salt, and sea salt. Worcestershire sauce. Tartar sauce. Barbecue sauce. Teriyaki sauce. Soy sauce, including reduced-sodium. Steak sauce. Canned and packaged gravies. Fish sauce. Oyster sauce. Cocktail  sauce. Horseradish that you find on the shelf. Ketchup. Mustard. Meat flavorings and tenderizers. Bouillon cubes. Hot sauce and Tabasco sauce. Premade or packaged marinades. Premade or packaged taco seasonings. Relishes. Regular salad dressings. Where to find more information:  National Heart, Lung, and [place]: https://wilson-eaton.com/  American Heart Association: www.heart.org Summary  The DASH eating plan is a healthy eating plan that has been shown to reduce high blood pressure (hypertension). It may also reduce your risk for type 2 diabetes, heart disease, and stroke.  With the DASH eating plan, you should limit salt (sodium) intake to 2,300 mg a day. If you have hypertension, you may need to reduce your sodium intake to 1,500 mg a day.  When on the DASH eating plan, aim to eat more fresh fruits and vegetables, whole grains, lean proteins, low-fat dairy, and heart-healthy fats.  Work with your health care provider or diet and nutrition specialist (dietitian) to adjust your eating plan to your individual calorie needs. This information is not intended to replace advice given to you by your health care provider. Make sure you discuss any questions you have with your health care provider. Document Revised: 06/26/2017 Document Reviewed: 07/07/2016 Elsevier Patient Education  2020 [employer].

## 2019-11-10 ENCOUNTER — Other Ambulatory Visit: Payer: Self-pay

## 2019-11-10 DIAGNOSIS — E782 Mixed hyperlipidemia: Secondary | ICD-10-CM

## 2019-11-10 DIAGNOSIS — I10 Essential (primary) hypertension: Secondary | ICD-10-CM

## 2019-11-10 MED ORDER — MIRABEGRON ER 25 MG PO TB24: 25.0000 mg | ORAL_TABLET | Freq: Every day | ORAL | 2 refills | Status: DC

## 2019-11-10 MED ORDER — PRAVASTATIN SODIUM 40 MG PO TABS: 40.0000 mg | ORAL_TABLET | Freq: Every day | ORAL | 3 refills | Status: DC

## 2019-11-10 MED ORDER — HYDROCORTISONE ACE-PRAMOXINE 1-1 % EX CREA: TOPICAL_CREAM | Freq: Three times a day (TID) | CUTANEOUS | 2 refills | Status: AC

## 2019-11-10 MED ORDER — TRAMADOL HCL 50 MG PO TABS: 50.0000 mg | ORAL_TABLET | Freq: Four times a day (QID) | ORAL | 2 refills | Status: DC | PRN

## 2019-11-10 MED ORDER — ALENDRONATE SODIUM 70 MG PO TABS: 70.0000 mg | ORAL_TABLET | ORAL | 2 refills | Status: DC

## 2019-11-10 MED ORDER — ALPRAZOLAM 0.25 MG PO TABS: 0.2500 mg | ORAL_TABLET | Freq: Two times a day (BID) | ORAL | 1 refills | Status: DC | PRN

## 2019-11-10 MED ORDER — VALSARTAN 40 MG PO TABS: 40.0000 mg | ORAL_TABLET | Freq: Every day | ORAL | 2 refills | Status: DC

## 2019-11-10 MED ORDER — AMLODIPINE BESYLATE 5 MG PO TABS: 5.0000 mg | ORAL_TABLET | Freq: Every evening | ORAL | 3 refills | Status: AC

## 2019-11-10 MED ORDER — MIRTAZAPINE 15 MG PO TABS: 15.0000 mg | ORAL_TABLET | Freq: Every day | ORAL | 2 refills | Status: DC

## 2019-11-10 MED ORDER — OMEPRAZOLE 40 MG PO CPDR: 40.0000 mg | DELAYED_RELEASE_CAPSULE | Freq: Every day | ORAL | 2 refills | Status: DC

## 2019-11-10 MED ORDER — FLUTICASONE PROPIONATE 50 MCG/ACT NA SUSP: 1.0000 | Freq: Every day | NASAL | 3 refills | Status: DC

## 2019-11-10 MED ORDER — FUROSEMIDE 20 MG PO TABS: 20.0000 mg | ORAL_TABLET | Freq: Every morning | ORAL | 3 refills | Status: DC

## 2019-11-10 NOTE — Progress Notes (Signed)
Patient has an appointment on 11/10/2019 with Sherre Poot, PharmD.

## 2019-11-16 ENCOUNTER — Encounter: Payer: Self-pay | Admitting: Legal Medicine

## 2019-11-16 ENCOUNTER — Other Ambulatory Visit: Payer: Self-pay | Admitting: Legal Medicine

## 2019-11-16 ENCOUNTER — Other Ambulatory Visit: Payer: Self-pay

## 2019-11-16 ENCOUNTER — Ambulatory Visit (INDEPENDENT_AMBULATORY_CARE_PROVIDER_SITE_OTHER): Payer: PPO | Admitting: Legal Medicine

## 2019-11-16 VITALS — BP 120/80 | HR 70 | Temp 98.0°F | Resp 18 | Ht 62.5 in | Wt 162.0 lb

## 2019-11-16 DIAGNOSIS — M7501 Adhesive capsulitis of right shoulder: Secondary | ICD-10-CM

## 2019-11-16 MED ORDER — TRIAMCINOLONE ACETONIDE 40 MG/ML IJ SUSP
80.0000 mg | Freq: Once | INTRAMUSCULAR | Status: AC
  Administered 2019-11-16: 80 mg via INTRA_ARTICULAR

## 2019-11-16 NOTE — Assessment & Plan Note (Signed)
Patient has a long history of right shoulder pain , she has developed adhesive capsulitis.  Plan is to inject and start home PT.

## 2019-11-16 NOTE — Progress Notes (Signed)
Established Patient Office Visit  Subjective:  Patient ID: Sandra Lynch, female    DOB: 1934-12-05  Age: 85 y.o. MRN: [id number]  CC:  Chief Complaint  Patient presents with  . Shoulder Pain    Right shoulder pain    HPI [name] presents for right shoulder pain.  She has been unable to move shoulder for 6 months.  She had injection in the past.  He uses arm to push off wheel chair and with rollator.  This is a recurrent problems.  Past Medical History:  Diagnosis Date  . Allergic rhinitis   . Anxiety   . Arthritis    "scattered in my joints" (02/11/2018)  . Atrial fibrillation ([place])   . Cough   . Essential hypertension   . GERD (gastroesophageal reflux disease)   . Hyperlipidemia   . Hyponatremia   . Pneumonia ~ 11/2017   walking pneumonia  . Pneumonia    "as a child"  . Urge incontinence     Past Surgical History:  Procedure Laterality Date  . APPENDECTOMY    . BUNIONECTOMY WITH HAMMERTOE RECONSTRUCTION Right 02/11/2018  . CARDIOVASCULAR STRESS TEST     01/25/18 [facility]): No reversible defects, fixed defect of mod-size in the septal wall mid ventricle to base, hypokinetic septal wall from mid ventricle to base, LVEF 50%, low risk  . HALLUX FUSION Right 02/11/2018   Procedure: HALLUX MPJ FUSION RIGHT FOOT;  Surgeon: [name], DPM;  Location: [place];  Service: Podiatry;  Laterality: Right;  . HAMMER TOE SURGERY Right 02/11/2018   Procedure: RIGHT HAMMER TOE CORRECTION SECOND, THIRD, FOURTH AND FIFTH;  Surgeon: [name], DPM;  Location: [place];  Service: Podiatry;  Laterality: Right;  . METATARSAL HEAD EXCISION Right 02/11/2018   Procedure: RIGHT PAN METATARSAL HEAD RESECTION;  Surgeon: [name], DPM;  Location: [place];  Service: Podiatry;  Laterality: Right;  . TOTAL ABDOMINAL HYSTERECTOMY    . TRANSTHORACIC ECHOCARDIOGRAM     01/24/18 [facility]): LV systolic function normal, EF 55-60%, impaired relaxation, LA mildly dilated,  trace-mild MR, trace TR    Family History  Problem Relation Age of Onset  . Pneumonia Mother   . Other Mother 53       Pneumonia  . Heart attack Father   . Hypertension Father   . Other Brother        Oncologist    Social History   Socioeconomic History  . Marital status: Widowed    Spouse name: Not on file  . Number of children: 2  . Years of education: Not on file  . Highest education level: Not on file  Occupational History  . Occupation: retired    Comment: Engineer, mining  Tobacco Use  . Smoking status: Never Smoker  . Smokeless tobacco: Never Used  Substance and Sexual Activity  . Alcohol use: Not Currently    Comment: 02/11/2018 "couple driinks/month"  . Drug use: Never  . Sexual activity: Not Currently  Other Topics Concern  . Not on file  Social History Narrative  . Not on file   Social Determinants of Health   Financial Resource Strain:   . Difficulty of Paying Living Expenses:   Food Insecurity: No Food Insecurity  . Worried About Charity fundraiser in the Last Year: Never true  . Ran Out of Food in the Last Year: Never true  Transportation Needs: No Transportation Needs  . Lack of Transportation (Medical):  No  . Lack of Transportation (Non-Medical): No  Physical Activity:   . Days of Exercise per Week:   . Minutes of Exercise per Session:   Stress:   . Feeling of Stress :   Social Connections:   . Frequency of Communication with Friends and Family:   . Frequency of Social Gatherings with Friends and Family:   . Attends Religious Services:   . Active Member of Clubs or Organizations:   . Attends Archivist Meetings:   Marland Kitchen Marital Status:   Intimate Partner Violence:   . Fear of Current or Ex-Partner:   . Emotionally Abused:   Marland Kitchen Physically Abused:   . Sexually Abused:     Outpatient Medications Prior to Visit  Medication Sig Dispense Refill  . albuterol (VENTOLIN HFA) 108 (90 Base) MCG/ACT inhaler Inhale 2 puffs into the  lungs every 6 (six) hours as needed for wheezing or shortness of breath. 8 g 2  . alendronate (FOSAMAX) 70 MG tablet Take 1 tablet (70 mg total) by mouth once a week. 4 tablet 2  . ALPRAZolam (XANAX) 0.25 MG tablet Take 1-2 tablets (0.25-0.5 mg total) by mouth 2 (two) times daily as needed for anxiety. 90 tablet 1  . amLODipine (NORVASC) 5 MG tablet Take 1 tablet (5 mg total) by mouth every evening. 30 tablet 3  . aspirin EC 81 MG tablet Take 1 tablet by mouth daily.    . Calcium Carbonate-Vitamin D (CALTRATE 600+D PO) Take 1 tablet by mouth daily.    . furosemide (LASIX) 20 MG tablet Take 1 tablet (20 mg total) by mouth in the morning. 30 tablet 3  . mirabegron ER (MYRBETRIQ) 25 MG TB24 tablet Take 1 tablet (25 mg total) by mouth daily. 90 tablet 2  . mirtazapine (REMERON) 15 MG tablet Take 1 tablet (15 mg total) by mouth at bedtime. 30 tablet 2  . Multiple Vitamins-Minerals (CENTRUM SILVER 50+WOMEN PO) Take 1 tablet by mouth daily.     . naproxen sodium (ALEVE) 220 MG tablet Take 220 mg by mouth at bedtime.    . Omega-3 Fatty Acids (FISH OIL) 1200 MG CAPS Take 1 tablet by mouth daily.     Marland Kitchen omeprazole (PRILOSEC) 40 MG capsule Take 1 capsule (40 mg total) by mouth daily. 30 capsule 2  . pramoxine-hydrocortisone (PROCTOCREAM-HC) 1-1 % rectal cream Place rectally 3 (three) times daily. 30 g 2  . pravastatin (PRAVACHOL) 40 MG tablet Take 1 tablet (40 mg total) by mouth at bedtime. 30 tablet 3  . sodium chloride (OCEAN) 0.65 % SOLN nasal spray Place 1 spray into both nostrils as needed for congestion.    . traMADol (ULTRAM) 50 MG tablet Take 1 tablet (50 mg total) by mouth every 6 (six) hours as needed. 120 tablet 2  . valsartan (DIOVAN) 40 MG tablet Take 1 tablet (40 mg total) by mouth daily. for blood pressure 90 tablet 2  . azithromycin (ZITHROMAX) 500 MG tablet Take 1 tablet (500 mg total) by mouth daily. 5 tablet 0  . fluticasone (FLONASE) 50 MCG/ACT nasal spray Place 1 spray into both nostrils  daily. 11.1 mL 3  . Guaifenesin (MUCINEX MAXIMUM STRENGTH) 1200 MG TB12 Take 1,200 mg by mouth at bedtime.     No facility-administered medications prior to visit.    Allergies  Allergen Reactions  . Pramipexole Other (See Comments)    She cannot walk or focus in anything.   Marland Kitchen Requip [Ropinirole] Shortness Of Breath  . Lisinopril Cough  ROS Review of Systems  Constitutional: Negative.   HENT: Negative.   Eyes: Negative.   Respiratory: Negative.   Cardiovascular: Negative.   Gastrointestinal: Negative.   Endocrine: Negative.   Genitourinary: Negative.   Musculoskeletal: Positive for arthralgias.  Skin: Negative.   Neurological: Negative.   Psychiatric/Behavioral: Negative.       Objective:    Physical Exam  Constitutional: She appears well-developed and well-nourished.  HENT:  Head: Normocephalic and atraumatic.  Eyes: Pupils are equal, round, and reactive to light. Conjunctivae and EOM are normal.  Cardiovascular: Normal rate, regular rhythm, normal heart sounds and intact distal pulses.  Pulmonary/Chest: Effort normal and breath sounds normal.  Musculoskeletal:     Right shoulder: Pain present.       Arms:     Cervical back: Normal range of motion and neck supple.     Comments: Right shoulder pain Flexion 60 degrees, Abduction 60 degrees IR and ER 60 degrees  Vitals reviewed.   BP 120/80 (BP Location: Left Arm, Patient Position: Sitting)   Pulse 70   Temp 98 F (36.7 C) (Temporal)   Resp 18   Ht 5' 2.5" (1.588 m)   Wt 162 lb (73.5 kg)   BMI 29.16 kg/m  Wt Readings from Last 3 Encounters:  11/16/19 162 lb (73.5 kg)  10/19/19 162 lb (73.5 kg)  09/26/19 159 lb 12.8 oz (72.5 kg)     Health Maintenance Due  Topic Date Due  . FOOT EXAM  Never done  . OPHTHALMOLOGY EXAM  Never done  . COVID-19 Vaccine (1) Never done  . MAMMOGRAM  03/29/2019    There are no preventive care reminders to display for this patient.  No results found for: TSH Lab  Results  Component Value Date   WBC 6.4 09/06/2019   HGB 13.8 09/06/2019   HCT 42.1 09/06/2019   MCV 90 09/06/2019   PLT 350 09/06/2019   Lab Results  Component Value Date   NA 139 09/06/2019   K 4.4 09/06/2019   CO2 27 09/06/2019   GLUCOSE 93 09/06/2019   BUN 11 09/06/2019   CREATININE 0.65 09/06/2019   BILITOT 0.3 09/06/2019   ALKPHOS 69 09/06/2019   AST 21 09/06/2019   ALT 18 09/06/2019   PROT 7.1 09/06/2019   ALBUMIN 4.3 09/06/2019   CALCIUM 9.8 09/06/2019   ANIONGAP 11 02/11/2018   Lab Results  Component Value Date   CHOL 170 09/06/2019   Lab Results  Component Value Date   HDL 64 09/06/2019   Lab Results  Component Value Date   LDLCALC 90 09/06/2019   Lab Results  Component Value Date   TRIG 87 09/06/2019   Lab Results  Component Value Date   CHOLHDL 2.7 09/06/2019   Lab Results  Component Value Date   HGBA1C 6.0 (H) 09/06/2019      Assessment & Plan:   Problem List Items Addressed This Visit      Musculoskeletal and Integument   Adhesive capsulitis of right shoulder - Primary    Patient has a long history of right shoulder pain , she has developed adhesive capsulitis.  Plan is to inject and start home PT.        After consent was obtained, using sterile technique the right shoulder was prepped and plain Lidocaine 1% was used as local anesthetic. The joint was entered and 0 ml's of na colored fluid .  Steroid 80 mg and 3 ml plain Lidocaine was then injected and the needle  withdrawn.  The procedure was well tolerated.  The patient is asked to continue to rest the joint for a few more days before resuming regular activities.  It may be more painful for the first 1-2 days.  Watch for fever, or increased swelling or persistent pain in the joint. Call or return to clinic prn if such symptoms occur or there is failure to improve as anticipated.  No orders of the defined types were placed in this encounter.   Follow-up: Return next chronic visit.     Reinaldo Meeker, MD

## 2019-11-16 NOTE — Patient Instructions (Signed)
Shoulder Exercises Ask your health care provider which exercises are safe for you. Do exercises exactly as told by your health care provider and adjust them as directed. It is normal to feel mild stretching, pulling, tightness, or discomfort as you do these exercises. Stop right away if you feel sudden pain or your pain gets worse. Do not begin these exercises until told by your health care provider. Stretching exercises External rotation and abduction This exercise is sometimes called corner stretch. This exercise rotates your arm outward (external rotation) and moves your arm out from your body (abduction). 1. Stand in a doorway with one of your feet slightly in front of the other. This is called a staggered stance. If you cannot reach your forearms to the door frame, stand facing a corner of a room. 2. Choose one of the following positions as told by your health care provider: ? Place your hands and forearms on the door frame above your head. ? Place your hands and forearms on the door frame at the height of your head. ? Place your hands on the door frame at the height of your elbows. 3. Slowly move your weight onto your front foot until you feel a stretch across your chest and in the front of your shoulders. Keep your head and chest upright and keep your abdominal muscles tight. 4. Hold for __________ seconds. 5. To release the stretch, shift your weight to your back foot. Repeat __________ times. Complete this exercise __________ times a day. Extension, standing 1. Stand and hold a broomstick, a cane, or a similar object behind your back. ? Your hands should be a little wider than shoulder width apart. ? Your palms should face away from your back. 2. Keeping your elbows straight and your shoulder muscles relaxed, move the stick away from your body until you feel a stretch in your shoulders (extension). ? Avoid shrugging your shoulders while you move the stick. Keep your shoulder blades tucked  down toward the middle of your back. 3. Hold for __________ seconds. 4. Slowly return to the starting position. Repeat __________ times. Complete this exercise __________ times a day. Range-of-motion exercises Pendulum  1. Stand near a wall or a surface that you can hold onto for balance. 2. Bend at the waist and let your left / right arm hang straight down. Use your other arm to support you. Keep your back straight and do not lock your knees. 3. Relax your left / right arm and shoulder muscles, and move your hips and your trunk so your left / right arm swings freely. Your arm should swing because of the motion of your body, not because you are using your arm or shoulder muscles. 4. Keep moving your hips and trunk so your arm swings in the following directions, as told by your health care provider: ? Side to side. ? Forward and backward. ? In clockwise and counterclockwise circles. 5. Continue each motion for __________ seconds, or for as long as told by your health care provider. 6. Slowly return to the starting position. Repeat __________ times. Complete this exercise __________ times a day. Shoulder flexion, standing  1. Stand and hold a broomstick, a cane, or a similar object. Place your hands a little more than shoulder width apart on the object. Your left / right hand should be palm up, and your other hand should be palm down. 2. Keep your elbow straight and your shoulder muscles relaxed. Push the stick up with your healthy arm to   raise your left / right arm in front of your body, and then over your head until you feel a stretch in your shoulder (flexion). ? Avoid shrugging your shoulder while you raise your arm. Keep your shoulder blade tucked down toward the middle of your back. 3. Hold for __________ seconds. 4. Slowly return to the starting position. Repeat __________ times. Complete this exercise __________ times a day. Shoulder abduction, standing 1. Stand and hold a broomstick,  a cane, or a similar object. Place your hands a little more than shoulder width apart on the object. Your left / right hand should be palm up, and your other hand should be palm down. 2. Keep your elbow straight and your shoulder muscles relaxed. Push the object across your body toward your left / right side. Raise your left / right arm to the side of your body (abduction) until you feel a stretch in your shoulder. ? Do not raise your arm above shoulder height unless your health care provider tells you to do that. ? If directed, raise your arm over your head. ? Avoid shrugging your shoulder while you raise your arm. Keep your shoulder blade tucked down toward the middle of your back. 3. Hold for __________ seconds. 4. Slowly return to the starting position. Repeat __________ times. Complete this exercise __________ times a day. Internal rotation  1. Place your left / right hand behind your back, palm up. 2. Use your other hand to dangle an exercise band, a towel, or a similar object over your shoulder. Grasp the band with your left / right hand so you are holding on to both ends. 3. Gently pull up on the band until you feel a stretch in the front of your left / right shoulder. The movement of your arm toward the center of your body is called internal rotation. ? Avoid shrugging your shoulder while you raise your arm. Keep your shoulder blade tucked down toward the middle of your back. 4. Hold for __________ seconds. 5. Release the stretch by letting go of the band and lowering your hands. Repeat __________ times. Complete this exercise __________ times a day. Strengthening exercises External rotation  1. Sit in a stable chair without armrests. 2. Secure an exercise band to a stable object at elbow height on your left / right side. 3. Place a soft object, such as a folded towel or a small pillow, between your left / right upper arm and your body to move your elbow about 4 inches (10 cm) away  from your side. 4. Hold the end of the exercise band so it is tight and there is no slack. 5. Keeping your elbow pressed against the soft object, slowly move your forearm out, away from your abdomen (external rotation). Keep your body steady so only your forearm moves. 6. Hold for __________ seconds. 7. Slowly return to the starting position. Repeat __________ times. Complete this exercise __________ times a day. Shoulder abduction  1. Sit in a stable chair without armrests, or stand up. 2. Hold a __________ weight in your left / right hand, or hold an exercise band with both hands. 3. Start with your arms straight down and your left / right palm facing in, toward your body. 4. Slowly lift your left / right hand out to your side (abduction). Do not lift your hand above shoulder height unless your health care provider tells you that this is safe. ? Keep your arms straight. ? Avoid shrugging your shoulder while you   do this movement. Keep your shoulder blade tucked down toward the middle of your back. 5. Hold for __________ seconds. 6. Slowly lower your arm, and return to the starting position. Repeat __________ times. Complete this exercise __________ times a day. Shoulder extension 1. Sit in a stable chair without armrests, or stand up. 2. Secure an exercise band to a stable object in front of you so it is at shoulder height. 3. Hold one end of the exercise band in each hand. Your palms should face each other. 4. Straighten your elbows and lift your hands up to shoulder height. 5. Step back, away from the secured end of the exercise band, until the band is tight and there is no slack. 6. Squeeze your shoulder blades together as you pull your hands down to the sides of your thighs (extension). Stop when your hands are straight down by your sides. Do not let your hands go behind your body. 7. Hold for __________ seconds. 8. Slowly return to the starting position. Repeat __________ times.  Complete this exercise __________ times a day. Shoulder row 1. Sit in a stable chair without armrests, or stand up. 2. Secure an exercise band to a stable object in front of you so it is at waist height. 3. Hold one end of the exercise band in each hand. Position your palms so that your thumbs are facing the ceiling (neutral position). 4. Bend each of your elbows to a 90-degree angle (right angle) and keep your upper arms at your sides. 5. Step back until the band is tight and there is no slack. 6. Slowly pull your elbows back behind you. 7. Hold for __________ seconds. 8. Slowly return to the starting position. Repeat __________ times. Complete this exercise __________ times a day. Shoulder press-ups  1. Sit in a stable chair that has armrests. Sit upright, with your feet flat on the floor. 2. Put your hands on the armrests so your elbows are bent and your fingers are pointing forward. Your hands should be about even with the sides of your body. 3. Push down on the armrests and use your arms to lift yourself off the chair. Straighten your elbows and lift yourself up as much as you comfortably can. ? Move your shoulder blades down, and avoid letting your shoulders move up toward your ears. ? Keep your feet on the ground. As you get stronger, your feet should support less of your body weight as you lift yourself up. 4. Hold for __________ seconds. 5. Slowly lower yourself back into the chair. Repeat __________ times. Complete this exercise __________ times a day. Wall push-ups  1. Stand so you are facing a stable wall. Your feet should be about one arm-length away from the wall. 2. Lean forward and place your palms on the wall at shoulder height. 3. Keep your feet flat on the floor as you bend your elbows and lean forward toward the wall. 4. Hold for __________ seconds. 5. Straighten your elbows to push yourself back to the starting position. Repeat __________ times. Complete this exercise  __________ times a day. This information is not intended to replace advice given to you by your health care provider. Make sure you discuss any questions you have with your health care provider. Document Revised: 11/05/2018 Document Reviewed: 08/13/2018 [employer] Patient Education  2020 [employer]  

## 2019-11-21 NOTE — Chronic Care Management (AMB) (Signed)
Followed-up with patient at her home about her medications and Patient assistance. Patent approved and signed the patient assistance form to be submitted for Myrbetriq. Pharmacist also counseled careivers on Mrs. Pha's medication regimen and adherence.   Sherre Poot, PharmD, [facility] Clinical Pharmacist [facility] [phone]

## 2019-11-24 ENCOUNTER — Other Ambulatory Visit: Payer: Self-pay | Admitting: Legal Medicine

## 2019-11-24 DIAGNOSIS — K219 Gastro-esophageal reflux disease without esophagitis: Secondary | ICD-10-CM

## 2019-12-01 ENCOUNTER — Other Ambulatory Visit: Payer: Self-pay | Admitting: Legal Medicine

## 2019-12-12 ENCOUNTER — Ambulatory Visit (INDEPENDENT_AMBULATORY_CARE_PROVIDER_SITE_OTHER): Payer: PPO | Admitting: Legal Medicine

## 2019-12-12 ENCOUNTER — Encounter: Payer: Self-pay | Admitting: Legal Medicine

## 2019-12-12 ENCOUNTER — Other Ambulatory Visit: Payer: Self-pay

## 2019-12-12 VITALS — BP 110/80 | HR 69 | Temp 97.2°F | Resp 16 | Ht 62.0 in

## 2019-12-12 DIAGNOSIS — I10 Essential (primary) hypertension: Secondary | ICD-10-CM | POA: Diagnosis not present

## 2019-12-12 DIAGNOSIS — G301 Alzheimer's disease with late onset: Secondary | ICD-10-CM

## 2019-12-12 DIAGNOSIS — G2581 Restless legs syndrome: Secondary | ICD-10-CM

## 2019-12-12 DIAGNOSIS — F419 Anxiety disorder, unspecified: Secondary | ICD-10-CM | POA: Diagnosis not present

## 2019-12-12 DIAGNOSIS — F0281 Dementia in other diseases classified elsewhere with behavioral disturbance: Secondary | ICD-10-CM | POA: Diagnosis not present

## 2019-12-12 DIAGNOSIS — E1142 Type 2 diabetes mellitus with diabetic polyneuropathy: Secondary | ICD-10-CM

## 2019-12-12 DIAGNOSIS — M810 Age-related osteoporosis without current pathological fracture: Secondary | ICD-10-CM

## 2019-12-12 DIAGNOSIS — F22 Delusional disorders: Secondary | ICD-10-CM | POA: Diagnosis not present

## 2019-12-12 DIAGNOSIS — N3946 Mixed incontinence: Secondary | ICD-10-CM | POA: Diagnosis not present

## 2019-12-12 DIAGNOSIS — F324 Major depressive disorder, single episode, in partial remission: Secondary | ICD-10-CM

## 2019-12-12 DIAGNOSIS — M7501 Adhesive capsulitis of right shoulder: Secondary | ICD-10-CM

## 2019-12-12 DIAGNOSIS — E782 Mixed hyperlipidemia: Secondary | ICD-10-CM

## 2019-12-12 MED ORDER — MIRTAZAPINE 30 MG PO TABS: 30.0000 mg | ORAL_TABLET | Freq: Every day | ORAL | 3 refills | Status: DC

## 2019-12-12 NOTE — Assessment & Plan Note (Signed)
Patient is stable MMSE today remains 24/30.  We discussed her caregiver complaints.

## 2019-12-12 NOTE — Assessment & Plan Note (Signed)
An individual care plan for diabetes was established and reinforced today.  The patient's status was assessed using clinical findings on exam, labs and diagnostic testing. Patient success at meeting goals based on disease specific evidence-based guidelines and found to be good controlled. Medications were assessed and patient's understanding of the medical issues , including barriers were assessed. Recommend adherence to a diabetic diet, a graduated exercise program, HgbA1c level is checked quarterly, and urine microalbumin performed yearly .  Annual mono-filament sensation testing performed. Lower blood pressure and control hyperlipidemia is important. Get annual eye exams and annual flu shots and smoking cessation discussed.  Self management goals were discussed. 

## 2019-12-12 NOTE — Assessment & Plan Note (Signed)
She is unable to move well.  Still in pain .  Last injection was on 11/16/2019 with 80mg  kenalog into joint.  Since it is not helping.  I needs a consult for other available treatments.  Refer t orthopedics for a consultation. Patient is able to ambulate with walker 10 feet.

## 2019-12-12 NOTE — Assessment & Plan Note (Signed)
Patient has restless legs at night, she is on medicines.

## 2019-12-12 NOTE — Assessment & Plan Note (Signed)
Plan of care was formulated today.  She is doing fair.  A plan of care was formulated using patient exam, tests and other sources to optimize care using evidence based information.  Recommend no smoking, no eating after supper, avoid fatty foods, elevate Head of bed, avoid tight fitting clothing.  Continue on calcium.

## 2019-12-12 NOTE — Assessment & Plan Note (Signed)

## 2019-12-12 NOTE — Assessment & Plan Note (Signed)
Patient continues to have periodic anxiety and some sundowing.  I increased mertazepam

## 2019-12-12 NOTE — Assessment & Plan Note (Signed)

## 2019-12-12 NOTE — Assessment & Plan Note (Signed)
Patient continues to have incontinence despite her medicines.

## 2019-12-12 NOTE — Assessment & Plan Note (Signed)
Patient's depression is controlled with no medicines at present.   Anhedonia better.  PHQ 9 was performed. An individual care plan was established or reinforced today.  The patient's disease status was assessed using clinical findings on exam, labs, and or other diagnostic testing to determine patient's success in meeting treatment goals based on disease specific evidence-based guidelines and found to be improving Recommendations include stay off medicines

## 2019-12-12 NOTE — Progress Notes (Signed)
Established Patient Office Visit  Subjective:  Patient ID: Sandra Lynch, female    DOB: 01/05/1935  Age: 85 y.o. MRN: [id number]  CC:  Chief Complaint  Patient presents with  . Hypertension  . Hyperlipidemia  . Depression    HPI [name] presents for chronic visit.  Patient has dementia Last MMSE 24/30 in 2020.  She is sundowning and at times aggressive. She is sleeping better now. Nighttime caregivers are complaining about her sundowning which she and granddaughter deny.  Patient is apparently belligerent with them. One caregiver can to office and gave [name] the behavioral complaints, telling us how difficult she can be.  We discussed and patient is ignorant of this complaints.  Patient presents for follow up of hypertension.  Patient tolerating valsartan well with side effects.  Patient was diagnosed with hypertension 2010 so has been treated for hypertension for 10 years.Patient is working on maintaining diet and exercise regimen and follows up as directed. Complication include none.  Patient presents with hyperlipidemia.  Compliance with treatment has been good; patient takes medicines as directed, maintains low cholesterol diet, follows up as directed, and maintains exercise regimen.  Patient is using pavastatin without problems.  Past Medical History:  Diagnosis Date  . Allergic rhinitis   . Anxiety   . Arthritis    "scattered in my joints" (02/11/2018)  . Cough   . Essential hypertension   . GERD (gastroesophageal reflux disease)   . Hyperlipidemia   . Hyponatremia   . Pneumonia    "as a child"  . Urge incontinence     Past Surgical History:  Procedure Laterality Date  . APPENDECTOMY    . BUNIONECTOMY WITH HAMMERTOE RECONSTRUCTION Right 02/11/2018  . CARDIOVASCULAR STRESS TEST     01/25/18 [facility]): No reversible defects, fixed defect of mod-size in the septal wall mid ventricle to base, hypokinetic septal wall from mid ventricle to base, LVEF 50%,  low risk  . HALLUX FUSION Right 02/11/2018   Procedure: HALLUX MPJ FUSION RIGHT FOOT;  Surgeon: [name], DPM;  Location: [place];  Service: Podiatry;  Laterality: Right;  . HAMMER TOE SURGERY Right 02/11/2018   Procedure: RIGHT HAMMER TOE CORRECTION SECOND, THIRD, FOURTH AND FIFTH;  Surgeon: [name], DPM;  Location: [place];  Service: Podiatry;  Laterality: Right;  . METATARSAL HEAD EXCISION Right 02/11/2018   Procedure: RIGHT PAN METATARSAL HEAD RESECTION;  Surgeon: [name], DPM;  Location: [employer];  Service: Podiatry;  Laterality: Right;  . TOTAL ABDOMINAL HYSTERECTOMY    . TRANSTHORACIC ECHOCARDIOGRAM     01/24/18 [facility]): LV systolic function normal, EF 55-60%, impaired relaxation, LA mildly dilated, trace-mild MR, trace TR    Family History  Problem Relation Age of Onset  . Pneumonia Mother   . Other Mother 18       Pneumonia  . Heart attack Father   . Hypertension Father   . Other Brother        Oncologist    Social History   Socioeconomic History  . Marital status: Widowed    Spouse name: Not on file  . Number of children: 2  . Years of education: Not on file  . Highest education level: Not on file  Occupational History  . Occupation: retired    Comment: Engineer, mining  Tobacco Use  . Smoking status: Never Smoker  . Smokeless tobacco: Never Used  Substance and Sexual Activity  . Alcohol use: Not Currently  Comment: 02/11/2018 "couple driinks/month"  . Drug use: Never  . Sexual activity: Not Currently  Other Topics Concern  . Not on file  Social History Narrative  . Not on file   Social Determinants of Health   Financial Resource Strain:   . Difficulty of Paying Living Expenses:   Food Insecurity: No Food Insecurity  . Worried About Charity fundraiser in the Last Year: Never true  . Ran Out of Food in the Last Year: Never true  Transportation Needs: No Transportation Needs  . Lack of Transportation (Medical): No  .  Lack of Transportation (Non-Medical): No  Physical Activity:   . Days of Exercise per Week:   . Minutes of Exercise per Session:   Stress:   . Feeling of Stress :   Social Connections:   . Frequency of Communication with Friends and Family:   . Frequency of Social Gatherings with Friends and Family:   . Attends Religious Services:   . Active Member of Clubs or Organizations:   . Attends Archivist Meetings:   Marland Kitchen Marital Status:   Intimate Partner Violence:   . Fear of Current or Ex-Partner:   . Emotionally Abused:   Marland Kitchen Physically Abused:   . Sexually Abused:     Outpatient Medications Prior to Visit  Medication Sig Dispense Refill  . albuterol (VENTOLIN HFA) 108 (90 Base) MCG/ACT inhaler Inhale 2 puffs into the lungs every 6 (six) hours as needed for wheezing or shortness of breath. 8 g 2  . alendronate (FOSAMAX) 70 MG tablet Take 1 tablet (70 mg total) by mouth once a week. 4 tablet 2  . ALPRAZolam (XANAX) 0.25 MG tablet Take 1-2 tablets (0.25-0.5 mg total) by mouth 2 (two) times daily as needed for anxiety. 90 tablet 1  . amLODipine (NORVASC) 5 MG tablet Take 1 tablet (5 mg total) by mouth every evening. 30 tablet 3  . aspirin EC 81 MG tablet Take 1 tablet by mouth daily.    . Calcium Carbonate-Vitamin D (CALTRATE 600+D PO) Take 1 tablet by mouth daily.    . fluticasone (FLONASE) 50 MCG/ACT nasal spray SPRAY 1 SPRAY INTO EACH NOSTRIL EVERY DAY 16 mL 3  . furosemide (LASIX) 20 MG tablet Take 1 tablet (20 mg total) by mouth in the morning. 30 tablet 3  . mirabegron ER (MYRBETRIQ) 25 MG TB24 tablet Take 1 tablet (25 mg total) by mouth daily. 90 tablet 2  . Multiple Vitamins-Minerals (CENTRUM SILVER 50+WOMEN PO) Take 1 tablet by mouth daily.     . naproxen sodium (ALEVE) 220 MG tablet Take 220 mg by mouth at bedtime.    . Omega-3 Fatty Acids (FISH OIL) 1200 MG CAPS Take 1 tablet by mouth daily.     Marland Kitchen omeprazole (PRILOSEC) 40 MG capsule TAKE 1 CAPSULE DAILY 90 capsule 2  .  pramoxine-hydrocortisone (PROCTOCREAM-HC) 1-1 % rectal cream Place rectally 3 (three) times daily. 30 g 2  . pravastatin (PRAVACHOL) 40 MG tablet Take 1 tablet (40 mg total) by mouth at bedtime. 30 tablet 3  . sodium chloride (OCEAN) 0.65 % SOLN nasal spray Place 1 spray into both nostrils as needed for congestion.    . traMADol (ULTRAM) 50 MG tablet Take 1 tablet (50 mg total) by mouth every 6 (six) hours as needed. 120 tablet 2  . valsartan (DIOVAN) 40 MG tablet Take 1 tablet (40 mg total) by mouth daily. for blood pressure 90 tablet 2  . mirtazapine (REMERON) 15 MG tablet  Take 1 tablet (15 mg total) by mouth at bedtime. 30 tablet 2   No facility-administered medications prior to visit.    Allergies  Allergen Reactions  . Pramipexole Other (See Comments)    She cannot walk or focus in anything.   Marland Kitchen Requip [Ropinirole] Shortness Of Breath  . Lisinopril Cough    ROS Review of Systems  Constitutional: Negative.   HENT: Negative.   Eyes: Negative.   Respiratory: Negative.   Cardiovascular: Negative.   Gastrointestinal: Negative.   Endocrine: Negative.   Genitourinary: Negative.   Musculoskeletal: Positive for arthralgias.  Neurological: Negative.   Psychiatric/Behavioral: Negative.       Objective:    Physical Exam  Constitutional: She appears well-developed and well-nourished.  HENT:  Head: Normocephalic and atraumatic.  Right Ear: External ear normal.  Left Ear: External ear normal.  Nose: Nose normal.  Mouth/Throat: Oropharynx is clear and moist.  Eyes: Pupils are equal, round, and reactive to light. Conjunctivae are normal.  Cardiovascular: Normal rate, regular rhythm, normal heart sounds and intact distal pulses.  Pulmonary/Chest: Effort normal and breath sounds normal.  Abdominal: Soft. Bowel sounds are normal.  Musculoskeletal:     Cervical back: Normal range of motion and neck supple.     Comments: Severe arthritis right shoulder and right knee- injections only  lasting one month.  Neurological:  Weakness legs and arms.  Skin: Skin is warm and dry.  Psychiatric: She has a normal mood and affect. Her behavior is normal.  Nursing note and vitals reviewed.   BP 110/80   Pulse 69   Temp (!) 97.2 F (36.2 C)   Resp 16   Ht 5\' 2"  (1.575 m)   SpO2 98%   BMI 29.63 kg/m  Wt Readings from Last 3 Encounters:  11/16/19 162 lb (73.5 kg)  10/19/19 162 lb (73.5 kg)  09/26/19 159 lb 12.8 oz (72.5 kg)     Health Maintenance Due  Topic Date Due  . FOOT EXAM  Never done  . OPHTHALMOLOGY EXAM  Never done  . COVID-19 Vaccine (1) Never done  . MAMMOGRAM  03/29/2019    There are no preventive care reminders to display for this patient.  No results found for: TSH Lab Results  Component Value Date   WBC 6.4 09/06/2019   HGB 13.8 09/06/2019   HCT 42.1 09/06/2019   MCV 90 09/06/2019   PLT 350 09/06/2019   Lab Results  Component Value Date   NA 139 09/06/2019   K 4.4 09/06/2019   CO2 27 09/06/2019   GLUCOSE 93 09/06/2019   BUN 11 09/06/2019   CREATININE 0.65 09/06/2019   BILITOT 0.3 09/06/2019   ALKPHOS 69 09/06/2019   AST 21 09/06/2019   ALT 18 09/06/2019   PROT 7.1 09/06/2019   ALBUMIN 4.3 09/06/2019   CALCIUM 9.8 09/06/2019   ANIONGAP 11 02/11/2018   Lab Results  Component Value Date   CHOL 170 09/06/2019   Lab Results  Component Value Date   HDL 64 09/06/2019   Lab Results  Component Value Date   LDLCALC 90 09/06/2019   Lab Results  Component Value Date   TRIG 87 09/06/2019   Lab Results  Component Value Date   CHOLHDL 2.7 09/06/2019   Lab Results  Component Value Date   HGBA1C 6.0 (H) 09/06/2019      Assessment & Plan:   Problem List Items Addressed This Visit      Cardiovascular and Mediastinum   Essential hypertension -  Primary (Chronic)    An individual hypertension care plan was established and reinforced today.  The patient's status was assessed using clinical findings on exam and labs or diagnostic  tests. The patient's success at meeting treatment goals on disease specific evidence-based guidelines and found to be well controlled. SELF MANAGEMENT: The patient and I together assessed ways to personally work towards obtaining the recommended goals. RECOMMENDATIONS: avoid decongestants found in common cold remedies, decrease consumption of alcohol, perform routine monitoring of BP with home BP cuff, exercise, reduction of dietary salt, take medicines as prescribed, try not to miss doses and quit smoking.  Regular exercise and maintaining a healthy weight is needed.  Stress reduction may help. A CLINICAL SUMMARY including written plan identify barriers to care unique to individual due to social or financial issues.  We attempt to mutually creat solutions for individual and family understanding.      Relevant Orders   CBC with Differential/Platelet   Comprehensive metabolic panel     Endocrine   Diabetic polyneuropathy associated with type 2 diabetes mellitus ([place])    An individual care plan for diabetes was established and reinforced today.  The patient's status was assessed using clinical findings on exam, labs and diagnostic testing. Patient success at meeting goals based on disease specific evidence-based guidelines and found to be good controlled. Medications were assessed and patient's understanding of the medical issues , including barriers were assessed. Recommend adherence to a diabetic diet, a graduated exercise program, HgbA1c level is checked quarterly, and urine microalbumin performed yearly .  Annual mono-filament sensation testing performed. Lower blood pressure and control hyperlipidemia is important. Get annual eye exams and annual flu shots and smoking cessation discussed.  Self management goals were discussed.      Relevant Medications   mirtazapine (REMERON) 30 MG tablet     Nervous and Auditory   Dementia of the Alzheimer's type, with late onset, with delusions ([place])  (Chronic)    Patient is stable MMSE today remains 24/30.  We discussed her caregiver complaints.      Relevant Medications   mirtazapine (REMERON) 30 MG tablet     Musculoskeletal and Integument   Osteoporosis (Chronic)    Plan of care was formulated today.  She is doing fair.  A plan of care was formulated using patient exam, tests and other sources to optimize care using evidence based information.  Recommend no smoking, no eating after supper, avoid fatty foods, elevate Head of bed, avoid tight fitting clothing.  Continue on calcium.      Adhesive capsulitis of right shoulder    She is unable to move well.  Still in pain .  Last injection was on 11/16/2019 with 80mg  kenalog into joint.  Since it is not helping.  I needs a consult for other available treatments.  Refer t orthopedics for a consultation. Patient is able to ambulate with walker 10 feet.      Relevant Orders   Ambulatory referral to Orthopedic Surgery     Other   Hyperlipidemia    AN INDIVIDUAL CARE PLAN for hyperlipidemia/ cholesterol was established and reinforced today.  The patient's status was assessed using clinical findings on exam, lab and other diagnostic tests. The patient's disease status was assessed based on evidence-based guidelines and found to be well controlled. MEDICATIONS were reviewed. SELF MANAGEMENT GOALS have been discussed and patient's success at attaining the goal of low cholesterol was assessed. RECOMMENDATION given include regular exercise 3 days a week and low  cholesterol/low fat diet. CLINICAL SUMMARY including written plan to identify barriers unique to the patient due to social or economic  reasons was discussed.      Relevant Orders   Lipid panel   Anxiety    Patient continues to have periodic anxiety and some sundowing.  I increased mertazepam      Relevant Medications   mirtazapine (REMERON) 30 MG tablet   Major depressive disorder with single episode, in partial remission ([place])     Patient's depression is controlled with no medicines at present.   Anhedonia better.  PHQ 9 was performed. An individual care plan was established or reinforced today.  The patient's disease status was assessed using clinical findings on exam, labs, and or other diagnostic testing to determine patient's success in meeting treatment goals based on disease specific evidence-based guidelines and found to be improving Recommendations include stay off medicines      Relevant Medications   mirtazapine (REMERON) 30 MG tablet   Mixed incontinence    Patient continues to have incontinence despite her medicines.      Restless leg    Patient has restless legs at night, she is on medicines.         Meds ordered this encounter  Medications  . mirtazapine (REMERON) 30 MG tablet    Sig: Take 1 tablet (30 mg total) by mouth at bedtime.    Dispense:  30 tablet    Refill:  3    Follow-up: Return in about 3 months (around 03/13/2020) for fasting.    Reinaldo Meeker, MD

## 2019-12-13 ENCOUNTER — Other Ambulatory Visit: Payer: Self-pay

## 2019-12-13 DIAGNOSIS — G301 Alzheimer's disease with late onset: Secondary | ICD-10-CM

## 2019-12-13 LAB — COMPREHENSIVE METABOLIC PANEL
ALT: 20 IU/L (ref 0–32)
AST: 20 IU/L (ref 0–40)
Albumin/Globulin Ratio: 1.5 (ref 1.2–2.2)
Albumin: 4.3 g/dL (ref 3.6–4.6)
Alkaline Phosphatase: 69 IU/L (ref 48–121)
BUN/Creatinine Ratio: 23 (ref 12–28)
BUN: 14 mg/dL (ref 8–27)
Bilirubin Total: 0.4 mg/dL (ref 0.0–1.2)
CO2: 26 mmol/L (ref 20–29)
Calcium: 9.5 mg/dL (ref 8.7–10.3)
Chloride: 92 mmol/L — ABNORMAL LOW (ref 96–106)
Creatinine, Ser: 0.61 mg/dL (ref 0.57–1.00)
GFR calc Af Amer: 98 mL/min/{1.73_m2} (ref >59)
GFR calc non Af Amer: 85 mL/min/{1.73_m2} (ref >59)
Globulin, Total: 2.8 g/dL (ref 1.5–4.5)
Glucose: 97 mg/dL (ref 65–99)
Potassium: 4.2 mmol/L (ref 3.5–5.2)
Sodium: 131 mmol/L — ABNORMAL LOW (ref 134–144)
Total Protein: 7.1 g/dL (ref 6.0–8.5)

## 2019-12-13 LAB — CBC WITH DIFFERENTIAL/PLATELET
Basophils Absolute: 0 10*3/uL (ref 0.0–0.2)
Basos: 0 %
EOS (ABSOLUTE): 0.1 10*3/uL (ref 0.0–0.4)
Eos: 1 %
Hematocrit: 41.6 % (ref 34.0–46.6)
Hemoglobin: 13.8 g/dL (ref 11.1–15.9)
Immature Grans (Abs): 0 10*3/uL (ref 0.0–0.1)
Immature Granulocytes: 0 %
Lymphocytes Absolute: 1.7 10*3/uL (ref 0.7–3.1)
Lymphs: 25 %
MCH: 30.2 pg (ref 26.6–33.0)
MCHC: 33.2 g/dL (ref 31.5–35.7)
MCV: 91 fL (ref 79–97)
Monocytes Absolute: 0.9 10*3/uL (ref 0.1–0.9)
Monocytes: 12 %
Neutrophils Absolute: 4.3 10*3/uL (ref 1.4–7.0)
Neutrophils: 62 %
Platelets: 293 10*3/uL (ref 150–450)
RBC: 4.57 x10E6/uL (ref 3.77–5.28)
RDW: 12.6 % (ref 11.7–15.4)
WBC: 7 10*3/uL (ref 3.4–10.8)

## 2019-12-13 LAB — LIPID PANEL
Chol/HDL Ratio: 2.7 ratio (ref 0.0–4.4)
Cholesterol, Total: 189 mg/dL (ref 100–199)
HDL: 69 mg/dL (ref >39)
LDL Chol Calc (NIH): 107 mg/dL — ABNORMAL HIGH (ref 0–99)
Triglycerides: 71 mg/dL (ref 0–149)
VLDL Cholesterol Cal: 13 mg/dL (ref 5–40)

## 2019-12-13 LAB — CARDIOVASCULAR RISK ASSESSMENT

## 2019-12-13 MED ORDER — MIRTAZAPINE 30 MG PO TABS: 30.0000 mg | ORAL_TABLET | Freq: Every day | ORAL | 3 refills | Status: DC

## 2019-12-13 NOTE — Progress Notes (Signed)
CBC normal, kidney and liver tests normal, LDL cholesterol 107,  lp

## 2019-12-15 ENCOUNTER — Other Ambulatory Visit: Payer: Self-pay | Admitting: Legal Medicine

## 2019-12-15 DIAGNOSIS — F0281 Dementia in other diseases classified elsewhere with behavioral disturbance: Secondary | ICD-10-CM

## 2019-12-15 DIAGNOSIS — F02818 Dementia in other diseases classified elsewhere, unspecified severity, with other behavioral disturbance: Secondary | ICD-10-CM

## 2019-12-15 MED ORDER — ALPRAZOLAM 0.25 MG PO TABS: 0.2500 mg | ORAL_TABLET | Freq: Two times a day (BID) | ORAL | 3 refills | Status: DC | PRN

## 2019-12-16 ENCOUNTER — Other Ambulatory Visit: Payer: Self-pay | Admitting: Legal Medicine

## 2020-01-05 ENCOUNTER — Other Ambulatory Visit: Payer: Self-pay | Admitting: Legal Medicine

## 2020-01-07 ENCOUNTER — Other Ambulatory Visit: Payer: Self-pay | Admitting: Legal Medicine

## 2020-01-07 DIAGNOSIS — G301 Alzheimer's disease with late onset: Secondary | ICD-10-CM

## 2020-01-25 ENCOUNTER — Other Ambulatory Visit: Payer: Self-pay

## 2020-01-26 ENCOUNTER — Ambulatory Visit: Payer: Self-pay | Admitting: Legal Medicine

## 2020-01-31 ENCOUNTER — Other Ambulatory Visit: Payer: Self-pay

## 2020-01-31 ENCOUNTER — Encounter: Payer: Self-pay | Admitting: Legal Medicine

## 2020-01-31 ENCOUNTER — Ambulatory Visit (INDEPENDENT_AMBULATORY_CARE_PROVIDER_SITE_OTHER): Payer: PPO | Admitting: Legal Medicine

## 2020-01-31 VITALS — BP 120/84 | HR 90 | Temp 98.1°F | Ht 62.0 in

## 2020-01-31 DIAGNOSIS — M79605 Pain in left leg: Secondary | ICD-10-CM | POA: Diagnosis not present

## 2020-01-31 DIAGNOSIS — I10 Essential (primary) hypertension: Secondary | ICD-10-CM

## 2020-01-31 DIAGNOSIS — M7989 Other specified soft tissue disorders: Secondary | ICD-10-CM | POA: Diagnosis not present

## 2020-01-31 DIAGNOSIS — I82492 Acute embolism and thrombosis of other specified deep vein of left lower extremity: Secondary | ICD-10-CM | POA: Diagnosis not present

## 2020-01-31 DIAGNOSIS — M7501 Adhesive capsulitis of right shoulder: Secondary | ICD-10-CM | POA: Diagnosis not present

## 2020-01-31 DIAGNOSIS — R600 Localized edema: Secondary | ICD-10-CM

## 2020-01-31 MED ORDER — FUROSEMIDE 40 MG PO TABS: 40.0000 mg | ORAL_TABLET | Freq: Every day | ORAL | 3 refills | Status: DC

## 2020-01-31 NOTE — Progress Notes (Signed)
Subjective:  Patient ID: Sandra Lynch, female    DOB: 1935/04/19  Age: 85 y.o. MRN: [id number]  Chief Complaint  Patient presents with  . Edema  . Shoulder Pain    HPI: peripheral edema left leg with pain , worse for 2 weeks.  Not eating salt and using TED hose. No dyspnea , not eating salt.  No chest pain  He has right shoulder adhesive capsulitis and last injection 10 weeks ago.    DM- she is not checking glucose at ll.   Current Outpatient Medications on File Prior to Visit  Medication Sig Dispense Refill  . albuterol (VENTOLIN HFA) 108 (90 Base) MCG/ACT inhaler Inhale 2 puffs into the lungs every 6 (six) hours as needed for wheezing or shortness of breath. 8 g 2  . alendronate (FOSAMAX) 70 MG tablet TAKE 1 TABLET BY MOUTH ONCE WEEKLY IN THE MORNING, AT LEAST 30 MIN BEFORE FIRST FOOD, BEVERAGE, OR MEDICATION OF DAY 12 tablet 2  . ALPRAZolam (XANAX) 0.25 MG tablet Take 1 tablet (0.25 mg total) by mouth 2 (two) times daily as needed for anxiety. 90 tablet 3  . amLODipine (NORVASC) 5 MG tablet Take 1 tablet (5 mg total) by mouth every evening. 30 tablet 3  . aspirin EC 81 MG tablet Take 1 tablet by mouth daily.    . Calcium Carbonate-Vitamin D (CALTRATE 600+D PO) Take 1 tablet by mouth daily.    . fluticasone (FLONASE) 50 MCG/ACT nasal spray SPRAY 1 SPRAY INTO EACH NOSTRIL EVERY DAY 16 mL 3  . mirabegron ER (MYRBETRIQ) 25 MG TB24 tablet Take 1 tablet (25 mg total) by mouth daily. 90 tablet 2  . mirtazapine (REMERON) 30 MG tablet TAKE 1 TABLET BY MOUTH AT BEDTIME. 90 tablet 2  . Multiple Vitamins-Minerals (CENTRUM SILVER 50+WOMEN PO) Take 1 tablet by mouth daily.     . naproxen sodium (ALEVE) 220 MG tablet Take 220 mg by mouth at bedtime.    . Omega-3 Fatty Acids (FISH OIL) 1200 MG CAPS Take 1 tablet by mouth daily.     Marland Kitchen omeprazole (PRILOSEC) 40 MG capsule TAKE 1 CAPSULE DAILY 90 capsule 2  . pramoxine-hydrocortisone (PROCTOCREAM-HC) 1-1 % rectal cream Place rectally 3 (three)  times daily. 30 g 2  . pravastatin (PRAVACHOL) 40 MG tablet TAKE 1 TABLET BY MOUTH EVERY DAY 90 tablet 2  . sodium chloride (OCEAN) 0.65 % SOLN nasal spray Place 1 spray into both nostrils as needed for congestion.    . traMADol (ULTRAM) 50 MG tablet Take 1 tablet (50 mg total) by mouth every 6 (six) hours as needed. 120 tablet 2  . valsartan (DIOVAN) 40 MG tablet Take 1 tablet (40 mg total) by mouth daily. for blood pressure 90 tablet 2   No current facility-administered medications on file prior to visit.   Past Medical History:  Diagnosis Date  . Allergic rhinitis   . Anxiety   . Arthritis    "scattered in my joints" (02/11/2018)  . Essential hypertension   . GERD (gastroesophageal reflux disease)   . Hyperlipidemia   . Hyponatremia   . Pneumonia    "as a child"   Past Surgical History:  Procedure Laterality Date  . APPENDECTOMY    . BUNIONECTOMY WITH HAMMERTOE RECONSTRUCTION Right 02/11/2018  . CARDIOVASCULAR STRESS TEST     01/25/18 [facility]): No reversible defects, fixed defect of mod-size in the septal wall mid ventricle to base, hypokinetic septal wall from mid ventricle to base, LVEF 50%,  low risk  . HALLUX FUSION Right 02/11/2018   Procedure: HALLUX MPJ FUSION RIGHT FOOT;  Surgeon: [name], DPM;  Location: [place];  Service: Podiatry;  Laterality: Right;  . HAMMER TOE SURGERY Right 02/11/2018   Procedure: RIGHT HAMMER TOE CORRECTION SECOND, THIRD, FOURTH AND FIFTH;  Surgeon: [name], DPM;  Location: [place];  Service: Podiatry;  Laterality: Right;  . METATARSAL HEAD EXCISION Right 02/11/2018   Procedure: RIGHT PAN METATARSAL HEAD RESECTION;  Surgeon: [name], DPM;  Location: [place];  Service: Podiatry;  Laterality: Right;  . TOTAL ABDOMINAL HYSTERECTOMY    . TRANSTHORACIC ECHOCARDIOGRAM     01/24/18 [facility]): LV systolic function normal, EF 55-60%, impaired relaxation, LA mildly dilated, trace-mild MR, trace TR    Family History  Problem  Relation Age of Onset  . Pneumonia Mother   . Other Mother 64       Pneumonia  . Heart attack Father   . Hypertension Father   . Other Brother        Oncologist   Social History   Socioeconomic History  . Marital status: Widowed    Spouse name: Not on file  . Number of children: 2  . Years of education: Not on file  . Highest education level: Not on file  Occupational History  . Occupation: retired    Comment: Engineer, mining  Tobacco Use  . Smoking status: Never Smoker  . Smokeless tobacco: Never Used  Vaping Use  . Vaping Use: Never used  Substance and Sexual Activity  . Alcohol use: Not Currently    Comment: 02/11/2018 "couple driinks/month"  . Drug use: Never  . Sexual activity: Not Currently  Other Topics Concern  . Not on file  Social History Narrative  . Not on file   Social Determinants of Health   Financial Resource Strain:   . Difficulty of Paying Living Expenses:   Food Insecurity: No Food Insecurity  . Worried About Charity fundraiser in the Last Year: Never true  . Ran Out of Food in the Last Year: Never true  Transportation Needs: No Transportation Needs  . Lack of Transportation (Medical): No  . Lack of Transportation (Non-Medical): No  Physical Activity:   . Days of Exercise per Week:   . Minutes of Exercise per Session:   Stress:   . Feeling of Stress :   Social Connections:   . Frequency of Communication with Friends and Family:   . Frequency of Social Gatherings with Friends and Family:   . Attends Religious Services:   . Active Member of Clubs or Organizations:   . Attends Archivist Meetings:   Marland Kitchen Marital Status:     Review of Systems  Constitutional: Negative.   HENT: Negative.   Eyes: Negative.   Respiratory: Negative.   Cardiovascular: Negative.   Gastrointestinal: Negative.   Endocrine: Negative.   Genitourinary: Negative.   Musculoskeletal: Positive for arthralgias.       Severe right shoulder pain    Neurological: Negative.   Psychiatric/Behavioral: Negative.      Objective:  BP 120/84   Pulse 90   Temp 98.1 F (36.7 C)   Ht 5\' 2"  (1.575 m)   SpO2 93%   BMI 29.63 kg/m   BP/Weight 01/31/2020 12/12/2019 2/45/8099  Systolic BP 833 825 053  Diastolic BP 84 80 80  Wt. (Lbs) - - 162  BMI 29.63 29.63 29.16    Physical Exam Vitals  reviewed.  Constitutional:      Appearance: Normal appearance.  HENT:     Head: Normocephalic and atraumatic.     Right Ear: Tympanic membrane normal.     Left Ear: Tympanic membrane normal.     Nose: Nose normal.     Mouth/Throat:     Mouth: Mucous membranes are dry.  Eyes:     Extraocular Movements: Extraocular movements intact.     Conjunctiva/sclera: Conjunctivae normal.     Pupils: Pupils are equal, round, and reactive to light.  Cardiovascular:     Rate and Rhythm: Normal rate and regular rhythm.     Pulses: Normal pulses.     Heart sounds: Normal heart sounds.  Pulmonary:     Effort: Pulmonary effort is normal.     Breath sounds: Normal breath sounds.  Abdominal:     General: Abdomen is flat. Bowel sounds are normal.     Palpations: Abdomen is soft.  Musculoskeletal:        General: Normal range of motion.     Cervical back: Normal range of motion and neck supple.  Skin:    General: Skin is warm and dry.     Capillary Refill: Capillary refill takes less than 2 seconds.  Neurological:     General: No focal deficit present.     Mental Status: She is alert.       Lab Results  Component Value Date   WBC 7.0 12/12/2019   HGB 13.8 12/12/2019   HCT 41.6 12/12/2019   PLT 293 12/12/2019   GLUCOSE 97 12/12/2019   CHOL 189 12/12/2019   TRIG 71 12/12/2019   HDL 69 12/12/2019   LDLCALC 107 (H) 12/12/2019   ALT 20 12/12/2019   AST 20 12/12/2019   NA 131 (L) 12/12/2019   K 4.2 12/12/2019   CL 92 (L) 12/12/2019   CREATININE 0.61 12/12/2019   BUN 14 12/12/2019   CO2 26 12/12/2019   HGBA1C 6.0 (H) 09/06/2019       Assessment & Plan:   1. Adhesive capsulitis of right shoulder The right shoulder was injected with kenalog After consent was obtained, using sterile technique the right shoulder was prepped and Ethyl Chloridewas used as local anesthetic. The joint was entered and   Steroid 80 mg and 3 ml plain Lidocaine was then injected and the needle withdrawn.  The procedure was well tolerated.  The patient is asked to continue to rest the joint for a few more days before resuming regular activities.  It may be more painful for the first 1-2 days.  Watch for fever, or increased swelling or persistent pain in the joint. Call or return to clinic prn if such symptoms occur or there is failure to improve as anticipated.   2. Acute deep vein thrombosis (DVT) of other specified vein of left lower extremity (HCC) - VAS [place] LOWER EXTREMITY VENOUS (DVT); Future Left lower leg is swollen and tender, venous dopplers are performed, the leg is wrapped  3. Essential hypertension - CBC with Differential/Platelet - Comprehensive metabolic panel - furosemide (LASIX) 40 MG tablet; Take 1 tablet (40 mg total) by mouth daily.  Dispense: 30 tablet; Refill: 3 An individual hypertension care plan was established and reinforced today.  The patient's status was assessed using clinical findings on exam and labs or diagnostic tests. The patient's success at meeting treatment goals on disease specific evidence-based guidelines and found to be well controlled. SELF MANAGEMENT: The patient and I together assessed ways to  personally work towards obtaining the recommended goals. RECOMMENDATIONS: avoid decongestants found in common cold remedies, decrease consumption of alcohol, perform routine monitoring of BP with home BP cuff, exercise, reduction of dietary salt, take medicines as prescribed, try not to miss doses and quit smoking.  Regular exercise and maintaining a healthy weight is needed.  Stress reduction may help. A CLINICAL  SUMMARY including written plan identify barriers to care unique to individual due to social or financial issues.  We attempt to mutually creat solutions for individual and family understanding.  4. Pedal edema Lasix was increased to 40mg  in AM qd    Meds ordered this encounter  Medications  . furosemide (LASIX) 40 MG tablet    Sig: Take 1 tablet (40 mg total) by mouth daily.    Dispense:  30 tablet    Refill:  3    Orders Placed This Encounter  Procedures  . CBC with Differential/Platelet  . Comprehensive metabolic panel  . VAS [place] LOWER EXTREMITY VENOUS (DVT)     Follow-up: Return in about 1 week (around 02/07/2020) for edema.  An After Visit Summary was printed and given to the patient.  [place] [phone]

## 2020-02-01 ENCOUNTER — Other Ambulatory Visit: Payer: Self-pay

## 2020-02-01 DIAGNOSIS — I82492 Acute embolism and thrombosis of other specified deep vein of left lower extremity: Secondary | ICD-10-CM

## 2020-02-07 ENCOUNTER — Other Ambulatory Visit: Payer: Self-pay

## 2020-02-07 ENCOUNTER — Ambulatory Visit (INDEPENDENT_AMBULATORY_CARE_PROVIDER_SITE_OTHER): Payer: PPO | Admitting: Legal Medicine

## 2020-02-07 ENCOUNTER — Encounter: Payer: Self-pay | Admitting: Legal Medicine

## 2020-02-07 VITALS — BP 126/80 | HR 78 | Temp 98.1°F | Resp 18 | Ht 62.0 in

## 2020-02-07 DIAGNOSIS — R600 Localized edema: Secondary | ICD-10-CM

## 2020-02-07 NOTE — Chronic Care Management (AMB) (Signed)
Chronic Care Management Pharmacy  Name: Sandra Lynch  MRN: [id number] DOB: May 05, 1935  Chief Complaint/ HPI  Sandra Lynch,  85 y.o. , female presents for their Initial CCM visit with the clinical pharmacist via telephone due to COVID-19 Pandemic.  PCP : Sandra Anes, MD  Their chronic conditions include: HTN, Afib, DM, Dementia, mixed incontinence, Osteoporosis, Restless Leg, Anxiety, HLD, Major Depressive Disorder.  Office Visits: 02/07/2020 - Edema - continue to use ACE wrap and furosemide.  01/31/2020 - Steroid injection for shoulder. Ultrasound ordered to rule out lower extremity DVT. Furosemide increased to 40 mg - order submitted.  12/12/2019 - CBC normal, kidney and liver tests normal, LDL cholesterol 107. Mirtazapine 30 mg refilled.  11/16/2019 - Adhesive capsulitis of right shoulder - steroid injection in shoulder. Referral for home PT.  10/19/2019 - cough and wheezing x 1 week. Short-term azithromycin and albuterol inhlaer prn.  09/26/2019 - Knee pain - steroid shot in knee 09/19/2019 - d/c ropinirole per patient.  09/06/2019 - added ropinirole for RLS. Changed omeprazole from 20-40 mg daily. 06/26/2020 - Knee injection 06/07/2020 - hemorrhoids given cream.   Consult Visit:n/a  Medications: Outpatient Encounter Medications as of 02/08/2020  Medication Sig   albuterol (VENTOLIN HFA) 108 (90 Base) MCG/ACT inhaler Inhale 2 puffs into the lungs every 6 (six) hours as needed for wheezing or shortness of breath.   alendronate (FOSAMAX) 70 MG tablet TAKE 1 TABLET BY MOUTH ONCE WEEKLY IN THE MORNING, AT LEAST 30 MIN BEFORE FIRST FOOD, BEVERAGE, OR MEDICATION OF DAY   ALPRAZolam (XANAX) 0.25 MG tablet Take 1 tablet (0.25 mg total) by mouth 2 (two) times daily as needed for anxiety.   amLODipine (NORVASC) 5 MG tablet Take 1 tablet (5 mg total) by mouth every evening.   aspirin EC 81 MG tablet Take 1 tablet by mouth daily.   Calcium Carbonate-Vitamin D  (CALTRATE 600+D PO) Take 1 tablet by mouth daily.   fluticasone (FLONASE) 50 MCG/ACT nasal spray SPRAY 1 SPRAY INTO EACH NOSTRIL EVERY DAY   furosemide (LASIX) 40 MG tablet Take 1 tablet (40 mg total) by mouth daily.   mirabegron ER (MYRBETRIQ) 25 MG TB24 tablet Take 1 tablet (25 mg total) by mouth daily.   mirtazapine (REMERON) 30 MG tablet TAKE 1 TABLET BY MOUTH AT BEDTIME.   Multiple Vitamins-Minerals (CENTRUM SILVER 50+WOMEN PO) Take 1 tablet by mouth daily.    naproxen sodium (ALEVE) 220 MG tablet Take 220 mg by mouth at bedtime.   Omega-3 Fatty Acids (FISH OIL) 1200 MG CAPS Take 1 tablet by mouth daily.    omeprazole (PRILOSEC) 40 MG capsule TAKE 1 CAPSULE DAILY   pramoxine-hydrocortisone (PROCTOCREAM-HC) 1-1 % rectal cream Place rectally 3 (three) times daily.   pravastatin (PRAVACHOL) 40 MG tablet TAKE 1 TABLET BY MOUTH EVERY DAY   sodium chloride (OCEAN) 0.65 % SOLN nasal spray Place 1 spray into both nostrils as needed for congestion.   traMADol (ULTRAM) 50 MG tablet Take 1 tablet (50 mg total) by mouth every 6 (six) hours as needed.   valsartan (DIOVAN) 40 MG tablet Take 1 tablet (40 mg total) by mouth daily. for blood pressure   No facility-administered encounter medications on file as of 02/08/2020.   Allergies  Allergen Reactions   Pramipexole Other (See Comments)    She cannot walk or focus in anything.    Requip [Ropinirole] Shortness Of Breath   Lisinopril Cough    SDOH Screenings   Alcohol Screen: Low Risk  Last Alcohol Screening Score (AUDIT): 0  Depression (PHQ2-9): Low Risk    PHQ-2 Score: 0  Financial Resource Strain:    Difficulty of Paying Living Expenses:   Food Insecurity: No Food Insecurity   Worried About Charity fundraiser in the Last Year: Never true   Ran Out of Food in the Last Year: Never true  Housing: Low Risk    Last Housing Risk Score: 0  Physical Activity:    Days of Exercise per Week:    Minutes of Exercise per  Session:   Social Connections:    Frequency of Communication with Friends and Family:    Frequency of Social Gatherings with Friends and Family:    Attends Religious Services:    Active Member of Clubs or Organizations:    Attends Music therapist:    Marital Status:   Stress:    Feeling of Stress :   Tobacco Use: Low Risk    Smoking Tobacco Use: Never Smoker   Smokeless Tobacco Use: Never Used  Transportation Needs: No Data processing manager (Medical): No   Lack of Transportation (Non-Medical): No     Current Diagnosis/Assessment:  Goals Addressed            This Visit's Progress    Pharmacy Care Plan       CARE PLAN ENTRY  Current Barriers:   Chronic Disease Management support, education, and care coordination needs related to HTN, HLD, and DMII  Pharmacist Clinical Goal(s):   Ensure safety, efficacy, and affordability of medications  Recommend maintaining BP <140/90 mmHg   Goal Cholesterol: TC <200, LDL <100, TG <150 HDL >50 mg/dL  Goal Hemoglobin A1C <7%.   Interventions:  Comprehensive medication review performed.  Pharmacist is assisting patient with medication management.   Pharmacist discussed sodium in patient's diet and areas that could help manage swelling.   Discussed elevating legs and continuing ACE wrap as Dr. Henrene Lynch recommended.   Patient Self Care Activities:   Recommend engaging in moderate intensity exercise 5 times each week.   Recommend following the [employer] guidance of incorporating fruits, vegetables, whole-grains, low-fat dairy products, lean meats, legumes and nuts for optimal health.  Recommend keeping sodium intake less than 1500 mg/day.   Recommend visiting your eye doctor for a dilated eye exam annually.   Recommend receiving annual flu shot each Fall.  Recommend checking feet daily and having a foot exam annually.   Please see past updates related to this goal by clicking  on the "Past Updates" button in the selected goal       COMPLETED: Sandra Lynch (see longitudinal plan of care for additional care plan information)  Current Barriers:   Financial Barriers: patient has HTA insurance and reports copay for Myrbetriq is cost prohibitive at this time  Myrbetriq for incontinence:  Patient enters the doughnut hole each year. Her copay is also expensive for this medication each month.   Pharmacist Clinical Goal(s):   Over the next 30 days, patient will work with PharmD and providers to relieve medication access concerns  Interventions:  Comprehensive medication review completed; medication list updated in electronic medical record.   Inter-disciplinary care team collaboration (see longitudinal plan of care)  Myrbetriq: Patient applied to determine if income/out of pocket spend criteria for this medication's patient assistance program. Reviewed application process. Patient will provide proof of income, out of pocket spend report, and  will sign application. Will collaborate with primary care provider Dr. Henrene Lynch for their portion of application. Once completed, will submit to [place] patient assistance program.   Patient Self Care Activities:   Patient will provide necessary portions of application   Patient is not eligible for PAP - goal completed  Please see past updates related to this goal by clicking on the "Past Updates" button in the selected goal         Diabetes   Recent Relevant Labs: Lab Results  Component Value Date/Time   HGBA1C 6.0 (H) 09/06/2019 12:31 PM     Checking BG: Never  Patient is currently controlled on the following medications: diet controlled  We discussed: diet and exercise extensively  Plan  Continue control with diet and exercise,  Hypertension/Afib   Office blood pressures are  BP Readings from Last 3 Encounters:  02/07/20 126/80  01/31/20 120/84  12/12/19  110/80    Patient has failed these meds in the past: lisinopril Patient is currently controlledon the following medications: valsartan, furosemide, amlodipine  Patient checks BP at home infrequently  Patient home BP readings are ranging: not really checking  We discussed diet and exercise extensively. Patient indicates that her caregivers fix her meat and vegetables or meat and salads for meals. She enjoys an occasional milk shake but otherwise eats healthy.  Update 02/08/2020 - Eats 2 meals daily - late breakfast and dinner each day.May have a small snack of a cupcake or treat in the afternoon. Drinks 2 cups of coffee, 1 soft drink daily and water the rest of the time. Usual breakfast is an egg, bacon and toast with or without oatmeal. Eats a good supper: meat and vegetables/salad. She has cut back on salt. We discussed elevating legs while sitting in recliner and continuing to use Ace wrap.   Plan  Continue current medications   Hyperlipidemia   Lipid Panel     Component Value Date/Time   CHOL 189 12/12/2019 1228   TRIG 71 12/12/2019 1228   HDL 69 12/12/2019 1228   CHOLHDL 2.7 12/12/2019 1228   LDLCALC 107 (H) 12/12/2019 1228   LABVLDL 13 12/12/2019 1228     The ASCVD Risk score (Goff DC Jr., et al., 2013) failed to calculate for the following reasons:   The 2013 ASCVD risk score is only valid for ages 8 to 77   The patient has a prior MI or stroke diagnosis   Patient has failed these meds in past: none indicated Patient is currently controlled on the following medications: pravastatin 40 mg tablet, omega 3 1200 mg capsules  We discussed:  diet and exercise extensively   Update 02/08/2020 - Discussed healthy diet and reduced fat sources in diet to help control cholesterol. Patient indicates vegetables and fiber are a daily part of her diet.   Plan  Continue current medications   Osteoporosis   Last Dexa scan reported in chart: 06/14/2018  Osteopenia / Osteoporosis    Last DEXA Scan: 06/14/2018  T-Score femoral neck: -1.8  T-Score forearm radius: -2.5   No results found for: VD25OH Patient is a candidate for pharmacologic treatment due to T-Score < -2.5 in forearm radius.   Patient has failed these meds in past: n/a Patient is currently controlled on the following medications:  alendronate 70 mg, calcium carbonate 600 with d daily   We discussed:  Recommend [phone] units of vitamin D daily. Recommend 1200 mg of calcium daily from dietary and supplemental sources. Counseled on oral bisphosphonate  administration: take in the morning, 30 minutes prior to food with 6-8 oz of water. Do not lie down for at least 30 minutes after taking. Recommend weight-bearing and muscle strengthening exercises for building and maintaining bone density.  Plan  Continue current medications    and  Anxiety/Depression:   Patient has failed these meds in past: clonazepam Patient is currently controlled on the following medications: alprazolam, mirtazapine  We discussed:  Patient is only taking alprazolam at night right now.    Update 02/08/2020 - Patient reports that she is resting well.   Plan  Continue current medications   Gastritis   Patient has failed these meds in past: omeprazole 20 mg daily prn Patient is currently controlled on the following medications: omeprazole 40 mg capsule daily  We discussed:  diet and exercise extensively  Plan  Continue current medications   Mixed incontinence   Patient has failed these meds in past: none noted by patient Patient is currently controlled on the following medications: Myrbetriq 25 mg  We discussed:  Patient indicates that medication is expensive and driving her in the doughnut hole.   Plan Continue current medications   Health Maintenance   Patient is currently controlled on the following medications:  Centrum Silver 50+ Women's - general health Proctocream-HC - hemorrhoids [place] spray/Flonase -  prn nasal congestion Mucinex 1200 mg - pm for congestion Alleve and tramadol - knee pain at bedtime   We discussed:  Patient overall pleased with medications.   Plan  Continue current medications  Vaccines   Reviewed and discussed patient's vaccination history.    Immunization History  Administered Date(s) Administered   Influenza,trivalent, recombinat, inj, PF 05/06/2010, 06/14/2010, 05/12/2011, 05/18/2012, 04/06/2015, 05/09/2016, 06/02/2017   Influenza-Unspecified 04/27/2018   Pneumococcal Conjugate-13 10/10/2013   Pneumococcal Polysaccharide-23 11/05/2010   Tdap 03/10/2011    Plan  Recommended patient receive annual flu vaccine in office.  Medication Management   Pt uses [facility] for all medications Caregivers fill up her pill box Pt endorses excellent compliance  We discussed: Caregivers are administering medications and organizing them. Patient is interested in synchronization and delivery.  Plan  Verbal consent obtained for [facility] enhanced pharmacy services (medication synchronization, adherence packaging, delivery coordination). A medication sync plan was created to allow patient to get all medications delivered once every 30 to 90 days per patient preference. Patient understands they have freedom to choose pharmacy and clinical pharmacist will coordinate care between all prescribers and [facility].  Follow up: 3 months

## 2020-02-07 NOTE — Progress Notes (Signed)
Subjective:  Patient ID: Sandra Lynch, female    DOB: March 18, 1935  Age: 85 y.o. MRN: [id number]  Chief Complaint  Patient presents with  . Edema    HPI:  Pedal edema, she is using lasix 40mg  and ACE bandage for let leg. No DVT. She is having no leg pain and her right shoulder is feeling improved.   Current Outpatient Medications on File Prior to Visit  Medication Sig Dispense Refill  . albuterol (VENTOLIN HFA) 108 (90 Base) MCG/ACT inhaler Inhale 2 puffs into the lungs every 6 (six) hours as needed for wheezing or shortness of breath. 8 g 2  . alendronate (FOSAMAX) 70 MG tablet TAKE 1 TABLET BY MOUTH ONCE WEEKLY IN THE MORNING, AT LEAST 30 MIN BEFORE FIRST FOOD, BEVERAGE, OR MEDICATION OF DAY 12 tablet 2  . ALPRAZolam (XANAX) 0.25 MG tablet Take 1 tablet (0.25 mg total) by mouth 2 (two) times daily as needed for anxiety. 90 tablet 3  . amLODipine (NORVASC) 5 MG tablet Take 1 tablet (5 mg total) by mouth every evening. 30 tablet 3  . aspirin EC 81 MG tablet Take 1 tablet by mouth daily.    . Calcium Carbonate-Vitamin D (CALTRATE 600+D PO) Take 1 tablet by mouth daily.    . fluticasone (FLONASE) 50 MCG/ACT nasal spray SPRAY 1 SPRAY INTO EACH NOSTRIL EVERY DAY 16 mL 3  . furosemide (LASIX) 40 MG tablet Take 1 tablet (40 mg total) by mouth daily. 30 tablet 3  . mirabegron ER (MYRBETRIQ) 25 MG TB24 tablet Take 1 tablet (25 mg total) by mouth daily. 90 tablet 2  . mirtazapine (REMERON) 30 MG tablet TAKE 1 TABLET BY MOUTH AT BEDTIME. 90 tablet 2  . Multiple Vitamins-Minerals (CENTRUM SILVER 50+WOMEN PO) Take 1 tablet by mouth daily.     . naproxen sodium (ALEVE) 220 MG tablet Take 220 mg by mouth at bedtime.    . Omega-3 Fatty Acids (FISH OIL) 1200 MG CAPS Take 1 tablet by mouth daily.     Marland Kitchen omeprazole (PRILOSEC) 40 MG capsule TAKE 1 CAPSULE DAILY 90 capsule 2  . pramoxine-hydrocortisone (PROCTOCREAM-HC) 1-1 % rectal cream Place rectally 3 (three) times daily. 30 g 2  . pravastatin  (PRAVACHOL) 40 MG tablet TAKE 1 TABLET BY MOUTH EVERY DAY 90 tablet 2  . sodium chloride (OCEAN) 0.65 % SOLN nasal spray Place 1 spray into both nostrils as needed for congestion.    . traMADol (ULTRAM) 50 MG tablet Take 1 tablet (50 mg total) by mouth every 6 (six) hours as needed. 120 tablet 2  . valsartan (DIOVAN) 40 MG tablet Take 1 tablet (40 mg total) by mouth daily. for blood pressure 90 tablet 2   No current facility-administered medications on file prior to visit.   Past Medical History:  Diagnosis Date  . Allergic rhinitis   . Anxiety   . Arthritis    "scattered in my joints" (02/11/2018)  . Essential hypertension   . GERD (gastroesophageal reflux disease)   . Hyperlipidemia   . Hyponatremia   . Pneumonia    "as a child"   Past Surgical History:  Procedure Laterality Date  . APPENDECTOMY    . BUNIONECTOMY WITH HAMMERTOE RECONSTRUCTION Right 02/11/2018  . CARDIOVASCULAR STRESS TEST     01/25/18 [facility]): No reversible defects, fixed defect of mod-size in the septal wall mid ventricle to base, hypokinetic septal wall from mid ventricle to base, LVEF 50%, low risk  . HALLUX FUSION Right 02/11/2018  Procedure: HALLUX MPJ FUSION RIGHT FOOT;  Surgeon: [name], DPM;  Location: [place];  Service: Podiatry;  Laterality: Right;  . HAMMER TOE SURGERY Right 02/11/2018   Procedure: RIGHT HAMMER TOE CORRECTION SECOND, THIRD, FOURTH AND FIFTH;  Surgeon: [name], DPM;  Location: [place];  Service: Podiatry;  Laterality: Right;  . METATARSAL HEAD EXCISION Right 02/11/2018   Procedure: RIGHT PAN METATARSAL HEAD RESECTION;  Surgeon: [name], DPM;  Location: [place];  Service: Podiatry;  Laterality: Right;  . TOTAL ABDOMINAL HYSTERECTOMY    . TRANSTHORACIC ECHOCARDIOGRAM     01/24/18 [facility]): LV systolic function normal, EF 55-60%, impaired relaxation, LA mildly dilated, trace-mild MR, trace TR    Family History  Problem Relation Age of Onset  . Pneumonia  Mother   . Other Mother 24       Pneumonia  . Heart attack Father   . Hypertension Father   . Other Brother        Oncologist   Social History   Socioeconomic History  . Marital status: Widowed    Spouse name: Not on file  . Number of children: 2  . Years of education: Not on file  . Highest education level: Not on file  Occupational History  . Occupation: retired    Comment: Engineer, mining  Tobacco Use  . Smoking status: Never Smoker  . Smokeless tobacco: Never Used  Vaping Use  . Vaping Use: Never used  Substance and Sexual Activity  . Alcohol use: Not Currently    Comment: 02/11/2018 "couple driinks/month"  . Drug use: Never  . Sexual activity: Not Currently  Other Topics Concern  . Not on file  Social History Narrative  . Not on file   Social Determinants of Health   Financial Resource Strain:   . Difficulty of Paying Living Expenses:   Food Insecurity: No Food Insecurity  . Worried About Charity fundraiser in the Last Year: Never true  . Ran Out of Food in the Last Year: Never true  Transportation Needs: No Transportation Needs  . Lack of Transportation (Medical): No  . Lack of Transportation (Non-Medical): No  Physical Activity:   . Days of Exercise per Week:   . Minutes of Exercise per Session:   Stress:   . Feeling of Stress :   Social Connections:   . Frequency of Communication with Friends and Family:   . Frequency of Social Gatherings with Friends and Family:   . Attends Religious Services:   . Active Member of Clubs or Organizations:   . Attends Archivist Meetings:   Marland Kitchen Marital Status:     Review of Systems  Constitutional: Negative.   HENT: Negative.   Eyes: Negative.   Respiratory: Negative.   Cardiovascular: Negative.   Gastrointestinal: Negative.   Genitourinary: Negative.   Musculoskeletal: Positive for arthralgias.  Neurological: Negative.      Objective:  BP 126/80   Pulse 78   Temp 98.1 F (36.7 C)    Resp 18   Ht 5\' 2"  (1.575 m)   SpO2 96%   BMI 29.63 kg/m   BP/Weight 02/07/2020 01/31/2020 3/54/6568  Systolic BP 127 517 001  Diastolic BP 80 84 80  Wt. (Lbs) - - -  BMI 29.63 29.63 29.63    Physical Exam Vitals reviewed.  Cardiovascular:     Rate and Rhythm: Normal rate and regular rhythm.     Pulses: Normal pulses.  Heart sounds: Normal heart sounds.  Pulmonary:     Effort: Pulmonary effort is normal.     Breath sounds: Normal breath sounds.  Musculoskeletal:     Right lower leg: No edema.     Left lower leg: No edema.       Lab Results  Component Value Date   WBC 7.0 12/12/2019   HGB 13.8 12/12/2019   HCT 41.6 12/12/2019   PLT 293 12/12/2019   GLUCOSE 97 12/12/2019   CHOL 189 12/12/2019   TRIG 71 12/12/2019   HDL 69 12/12/2019   LDLCALC 107 (H) 12/12/2019   ALT 20 12/12/2019   AST 20 12/12/2019   NA 131 (L) 12/12/2019   K 4.2 12/12/2019   CL 92 (L) 12/12/2019   CREATININE 0.61 12/12/2019   BUN 14 12/12/2019   CO2 26 12/12/2019   HGBA1C 6.0 (H) 09/06/2019      Assessment & Plan:   1. Pedal edema - Basic Metabolic Panel Continue to use fusosemide and Ace wraping of leg.     Orders Placed This Encounter  Procedures  . Basic Metabolic Panel     Follow-up: No follow-ups on file.  An After Visit Summary was printed and given to the patient.  [place] [phone]

## 2020-02-08 ENCOUNTER — Ambulatory Visit: Payer: PPO

## 2020-02-08 DIAGNOSIS — I10 Essential (primary) hypertension: Secondary | ICD-10-CM

## 2020-02-08 DIAGNOSIS — E782 Mixed hyperlipidemia: Secondary | ICD-10-CM

## 2020-02-08 LAB — BASIC METABOLIC PANEL
BUN/Creatinine Ratio: 25 (ref 12–28)
BUN: 16 mg/dL (ref 8–27)
CO2: 27 mmol/L (ref 20–29)
Calcium: 10 mg/dL (ref 8.7–10.3)
Chloride: 95 mmol/L — ABNORMAL LOW (ref 96–106)
Creatinine, Ser: 0.63 mg/dL (ref 0.57–1.00)
GFR calc Af Amer: 97 mL/min/{1.73_m2} (ref >59)
GFR calc non Af Amer: 84 mL/min/{1.73_m2} (ref >59)
Glucose: 93 mg/dL (ref 65–99)
Potassium: 4.4 mmol/L (ref 3.5–5.2)
Sodium: 138 mmol/L (ref 134–144)

## 2020-02-08 NOTE — Patient Instructions (Signed)
Visit Information  Goals Addressed            This Visit's Progress   . Pharmacy Care Plan       CARE PLAN ENTRY  Current Barriers:  . Chronic Disease Management support, education, and care coordination needs related to HTN, HLD, and DMII  Pharmacist Clinical Goal(s):  . Ensure safety, efficacy, and affordability of medications . Recommend maintaining BP <140/90 mmHg  . Goal Cholesterol: TC <200, LDL <100, TG <150 HDL >50 mg/dL . Goal Hemoglobin A1C <7%.   Interventions: . Comprehensive medication review performed. . Pharmacist is assisting patient with medication management.  . Pharmacist discussed sodium in patient's diet and areas that could help manage swelling.  . Discussed elevating legs and continuing ACE wrap as Dr. Henrene Pastor recommended.   Patient Self Care Activities:  . Recommend engaging in moderate intensity exercise 5 times each week.  . Recommend following the Dash diet guidance of incorporating fruits, vegetables, whole-grains, low-fat dairy products, lean meats, legumes and nuts for optimal health. . Recommend keeping sodium intake less than 1500 mg/day.  . Recommend visiting your eye doctor for a dilated eye exam annually.  . Recommend receiving annual flu shot each Fall. . Recommend checking feet daily and having a foot exam annually.   Please see past updates related to this goal by clicking on the "Past Updates" button in the selected goal      . COMPLETED: [place] (see longitudinal plan of care for additional care plan information)  Current Barriers:  . Financial Barriers: patient has HTA insurance and reports copay for Myrbetriq is cost prohibitive at this time . Myrbetriq for incontinence:  Patient enters the doughnut hole each year. Her copay is also expensive for this medication each month.   Pharmacist Clinical Goal(s):  Marland Kitchen Over the next 30 days, patient will work with PharmD and providers to  relieve medication access concerns  Interventions: . Comprehensive medication review completed; medication list updated in electronic medical record.  Bertram Savin care team collaboration (see longitudinal plan of care) . Myrbetriq: Patient applied to determine if income/out of pocket spend criteria for this medication's patient assistance program. Reviewed application process. Patient will provide proof of income, out of pocket spend report, and will sign application. Will collaborate with primary care provider Dr. Henrene Pastor for their portion of application. Once completed, will submit to [facility] patient assistance program.   Patient Self Care Activities:  . Patient will provide necessary portions of application  . Patient is not eligible for PAP - goal completed  Please see past updates related to this goal by clicking on the "Past Updates" button in the selected goal         The patient verbalized understanding of instructions provided today and declined a print copy of patient instruction materials.   Telephone follow up appointment with pharmacy team member scheduled for:07/2020  Sara Beth Yoselyn Mcglade, PharmD, [facility] Clinical Pharmacist [facility] [phone] (office) [phone] (mobile) Edema  Edema is when you have too much fluid in your body or under your skin. Edema may make your legs, feet, and ankles swell up. Swelling is also common in looser tissues, like around your eyes. This is a common condition. It gets more common as you get older. There are many possible causes of edema. Eating too much salt (sodium) and being on your feet or sitting for a long time can cause edema in your  legs, feet, and ankles. Hot weather may make edema worse. Edema is usually painless. Your skin may look swollen or shiny. Follow these instructions at home:  Keep the swollen body part raised (elevated) above the level of your heart when you are sitting or lying down.  Do not sit  still or stand for a long time.  Do not wear tight clothes. Do not wear garters on your upper legs.  Exercise your legs. This can help the swelling go down.  Wear elastic bandages or support stockings as told by your doctor.  Eat a low-salt (low-sodium) diet to reduce fluid as told by your doctor.  Depending on the cause of your swelling, you may need to limit how much fluid you drink (fluid restriction).  Take over-the-counter and prescription medicines only as told by your doctor. Contact a doctor if:  Treatment is not working.  You have heart, liver, or kidney disease and have symptoms of edema.  You have sudden and unexplained weight gain. Get help right away if:  You have shortness of breath or chest pain.  You cannot breathe when you lie down.  You have pain, redness, or warmth in the swollen areas.  You have heart, liver, or kidney disease and get edema all of a sudden.  You have a fever and your symptoms get worse all of a sudden. Summary  Edema is when you have too much fluid in your body or under your skin.  Edema may make your legs, feet, and ankles swell up. Swelling is also common in looser tissues, like around your eyes.  Raise (elevate) the swollen body part above the level of your heart when you are sitting or lying down.  Follow your doctor's instructions about diet and how much fluid you can drink (fluid restriction). This information is not intended to replace advice given to you by your health care provider. Make sure you discuss any questions you have with your health care provider. Document Revised: 07/17/2017 Document Reviewed: 08/01/2016 Elsevier Patient Education  2020 [employer].

## 2020-02-08 NOTE — Progress Notes (Signed)
Kidney and potassium normal lp

## 2020-02-10 ENCOUNTER — Other Ambulatory Visit: Payer: Self-pay | Admitting: Legal Medicine

## 2020-02-10 DIAGNOSIS — R600 Localized edema: Secondary | ICD-10-CM

## 2020-02-16 ENCOUNTER — Other Ambulatory Visit: Payer: Self-pay

## 2020-02-16 ENCOUNTER — Encounter: Payer: Self-pay | Admitting: Legal Medicine

## 2020-02-16 ENCOUNTER — Ambulatory Visit (INDEPENDENT_AMBULATORY_CARE_PROVIDER_SITE_OTHER): Payer: PPO | Admitting: Legal Medicine

## 2020-02-16 VITALS — BP 112/68 | HR 86 | Temp 98.2°F | Resp 16

## 2020-02-16 DIAGNOSIS — M1711 Unilateral primary osteoarthritis, right knee: Secondary | ICD-10-CM

## 2020-02-16 NOTE — Progress Notes (Signed)
Subjective:  Patient ID: Sandra Lynch, female    DOB: 05/23/1935  Age: 85 y.o. MRN: [id number]  Chief Complaint  Patient presents with  . Right Knee Pain    HPI: osteoarthrits right knee , chronic and stage 4.  She remains in wheelchair but she is having progressive pain in right knee.  She has been injected 3 months ago. She is usually in wheel chair but walks short distances.  She needs pain control in knees.  She is on a surgical candidate.   Current Outpatient Medications on File Prior to Visit  Medication Sig Dispense Refill  . albuterol (VENTOLIN HFA) 108 (90 Base) MCG/ACT inhaler Inhale 2 puffs into the lungs every 6 (six) hours as needed for wheezing or shortness of breath. 8 g 2  . alendronate (FOSAMAX) 70 MG tablet TAKE 1 TABLET BY MOUTH ONCE WEEKLY IN THE MORNING, AT LEAST 30 MIN BEFORE FIRST FOOD, BEVERAGE, OR MEDICATION OF DAY 12 tablet 2  . ALPRAZolam (XANAX) 0.25 MG tablet Take 1 tablet (0.25 mg total) by mouth 2 (two) times daily as needed for anxiety. 90 tablet 3  . amLODipine (NORVASC) 5 MG tablet Take 1 tablet (5 mg total) by mouth every evening. 30 tablet 3  . aspirin EC 81 MG tablet Take 1 tablet by mouth daily.    . Calcium Carbonate-Vitamin D (CALTRATE 600+D PO) Take 1 tablet by mouth daily.    . fluticasone (FLONASE) 50 MCG/ACT nasal spray SPRAY 1 SPRAY INTO EACH NOSTRIL EVERY DAY 16 mL 3  . furosemide (LASIX) 20 MG tablet TAKE 1 TABLET (20 MG) BY ORAL ROUTE 1 TIMES PER DAY 90 tablet 2  . furosemide (LASIX) 40 MG tablet Take 1 tablet (40 mg total) by mouth daily. 30 tablet 3  . mirabegron ER (MYRBETRIQ) 25 MG TB24 tablet Take 1 tablet (25 mg total) by mouth daily. 90 tablet 2  . mirtazapine (REMERON) 30 MG tablet TAKE 1 TABLET BY MOUTH AT BEDTIME. 90 tablet 2  . Multiple Vitamins-Minerals (CENTRUM SILVER 50+WOMEN PO) Take 1 tablet by mouth daily.     . naproxen sodium (ALEVE) 220 MG tablet Take 220 mg by mouth at bedtime.    . Omega-3 Fatty Acids (FISH OIL) 1200  MG CAPS Take 1 tablet by mouth daily.     Marland Kitchen omeprazole (PRILOSEC) 40 MG capsule TAKE 1 CAPSULE DAILY 90 capsule 2  . pramoxine-hydrocortisone (PROCTOCREAM-HC) 1-1 % rectal cream Place rectally 3 (three) times daily. 30 g 2  . pravastatin (PRAVACHOL) 40 MG tablet TAKE 1 TABLET BY MOUTH EVERY DAY 90 tablet 2  . sodium chloride (OCEAN) 0.65 % SOLN nasal spray Place 1 spray into both nostrils as needed for congestion.    . traMADol (ULTRAM) 50 MG tablet Take 1 tablet (50 mg total) by mouth every 6 (six) hours as needed. 120 tablet 2  . valsartan (DIOVAN) 40 MG tablet Take 1 tablet (40 mg total) by mouth daily. for blood pressure 90 tablet 2   No current facility-administered medications on file prior to visit.   Past Medical History:  Diagnosis Date  . Allergic rhinitis   . Anxiety   . Arthritis    "scattered in my joints" (02/11/2018)  . Essential hypertension   . GERD (gastroesophageal reflux disease)   . Hyperlipidemia   . Hyponatremia   . Pneumonia    "as a child"   Past Surgical History:  Procedure Laterality Date  . APPENDECTOMY    . BUNIONECTOMY WITH HAMMERTOE  RECONSTRUCTION Right 02/11/2018  . CARDIOVASCULAR STRESS TEST     01/25/18 [facility]): No reversible defects, fixed defect of mod-size in the septal wall mid ventricle to base, hypokinetic septal wall from mid ventricle to base, LVEF 50%, low risk  . HALLUX FUSION Right 02/11/2018   Procedure: HALLUX MPJ FUSION RIGHT FOOT;  Surgeon: [name], DPM;  Location: [place];  Service: Podiatry;  Laterality: Right;  . HAMMER TOE SURGERY Right 02/11/2018   Procedure: RIGHT HAMMER TOE CORRECTION SECOND, THIRD, FOURTH AND FIFTH;  Surgeon: [name], DPM;  Location: [place];  Service: Podiatry;  Laterality: Right;  . METATARSAL HEAD EXCISION Right 02/11/2018   Procedure: RIGHT PAN METATARSAL HEAD RESECTION;  Surgeon: [name], DPM;  Location: [place];  Service: Podiatry;  Laterality: Right;  . TOTAL ABDOMINAL  HYSTERECTOMY    . TRANSTHORACIC ECHOCARDIOGRAM     01/24/18 [facility]): LV systolic function normal, EF 55-60%, impaired relaxation, LA mildly dilated, trace-mild MR, trace TR    Family History  Problem Relation Age of Onset  . Pneumonia Mother   . Other Mother 24       Pneumonia  . Heart attack Father   . Hypertension Father   . Other Brother        Oncologist   Social History   Socioeconomic History  . Marital status: Widowed    Spouse name: Not on file  . Number of children: 2  . Years of education: Not on file  . Highest education level: Not on file  Occupational History  . Occupation: retired    Comment: Engineer, mining  Tobacco Use  . Smoking status: Never Smoker  . Smokeless tobacco: Never Used  Vaping Use  . Vaping Use: Never used  Substance and Sexual Activity  . Alcohol use: Not Currently    Comment: 02/11/2018 "couple driinks/month"  . Drug use: Never  . Sexual activity: Not Currently  Other Topics Concern  . Not on file  Social History Narrative  . Not on file   Social Determinants of Health   Financial Resource Strain:   . Difficulty of Paying Living Expenses:   Food Insecurity: No Food Insecurity  . Worried About Charity fundraiser in the Last Year: Never true  . Ran Out of Food in the Last Year: Never true  Transportation Needs: No Transportation Needs  . Lack of Transportation (Medical): No  . Lack of Transportation (Non-Medical): No  Physical Activity:   . Days of Exercise per Week:   . Minutes of Exercise per Session:   Stress:   . Feeling of Stress :   Social Connections:   . Frequency of Communication with Friends and Family:   . Frequency of Social Gatherings with Friends and Family:   . Attends Religious Services:   . Active Member of Clubs or Organizations:   . Attends Archivist Meetings:   Marland Kitchen Marital Status:     Review of Systems  Constitutional: Negative.   HENT: Negative.   Eyes: Negative.     Cardiovascular: Negative.   Genitourinary: Negative.   Musculoskeletal: Positive for arthralgias.  Skin: Negative.   Neurological:       Can take a few steps only, otherwise in wheel chair     Objective:  BP 112/68   Pulse 86   Temp 98.2 F (36.8 C)   Resp 16   SpO2 93%   BP/Weight 02/16/2020 6/62/9476 11/28/6501  Systolic BP 546 568  229  Diastolic BP 68 80 84  Wt. (Lbs) - - -  BMI - 29.63 29.63    Physical Exam Vitals reviewed.  Constitutional:      Appearance: Normal appearance.  Eyes:     Extraocular Movements: Extraocular movements intact.     Conjunctiva/sclera: Conjunctivae normal.     Pupils: Pupils are equal, round, and reactive to light.  Cardiovascular:     Rate and Rhythm: Normal rate and regular rhythm.     Pulses: Normal pulses.     Heart sounds: Normal heart sounds.  Pulmonary:     Effort: Pulmonary effort is normal.     Breath sounds: Normal breath sounds.  Musculoskeletal:        General: Swelling and tenderness present.     Comments: Pain and deformity right knee.  Minimal joint space, negative McMurray and Lachman, ROM 90 degrees, she is not a candidate for TKA.  Skin:    Capillary Refill: Capillary refill takes less than 2 seconds.  Neurological:     Mental Status: She is alert.     Motor: Weakness present.       Lab Results  Component Value Date   WBC 7.0 12/12/2019   HGB 13.8 12/12/2019   HCT 41.6 12/12/2019   PLT 293 12/12/2019   GLUCOSE 93 02/07/2020   CHOL 189 12/12/2019   TRIG 71 12/12/2019   HDL 69 12/12/2019   LDLCALC 107 (H) 12/12/2019   ALT 20 12/12/2019   AST 20 12/12/2019   NA 138 02/07/2020   K 4.4 02/07/2020   CL 95 (L) 02/07/2020   CREATININE 0.63 02/07/2020   BUN 16 02/07/2020   CO2 27 02/07/2020   HGBA1C 6.0 (H) 09/06/2019      Assessment & Plan:   1. Primary osteoarthritis of right knee  The right knee was treated with Kenalog injection with pain relief  After consent was obtained, using sterile  technique the right knee was prepped and Ethyl chloride  was used as local anesthetic. The joint was entered and no fluid obtained and Steroid 80 mg and 3 ml plain Lidocaine was then injected and the needle withdrawn.  The procedure was well tolerated.  The patient is asked to continue to rest the joint for a few more days before resuming regular activities.  It may be more painful for the first 1-2 days.  Watch for fever, or increased swelling or persistent pain in the joint. Call or return to clinic prn if such symptoms occur or there is failure to improve as anticipated.       Follow-up: Return if symptoms worsen or fail to improve.  An After Visit Summary was printed and given to the patient.  [place] [phone]

## 2020-02-16 NOTE — Patient Instructions (Signed)
Journal for Nurse Practitioners, 15(4), 263-267. Retrieved May 03, 2018 from [email or web address].[email or web address]">  Knee Exercises Ask your health care provider which exercises are safe for you. Do exercises exactly as told by your health care provider and adjust them as directed. It is normal to feel mild stretching, pulling, tightness, or discomfort as you do these exercises. Stop right away if you feel sudden pain or your pain gets worse. Do not begin these exercises until told by your health care provider. Stretching and range-of-motion exercises These exercises warm up your muscles and joints and improve the movement and flexibility of your knee. These exercises also help to relieve pain and swelling. Knee extension, prone 1. Lie on your abdomen (prone position) on a bed. 2. Place your left / right knee just beyond the edge of the surface so your knee is not on the bed. You can put a towel under your left / right thigh just above your kneecap for comfort. 3. Relax your leg muscles and allow gravity to straighten your knee (extension). You should feel a stretch behind your left / right knee. 4. Hold this position for __________ seconds. 5. Scoot up so your knee is supported between repetitions. Repeat __________ times. Complete this exercise __________ times a day. Knee flexion, active  1. Lie on your back with both legs straight. If this causes back discomfort, bend your left / right knee so your foot is flat on the floor. 2. Slowly slide your left / right heel back toward your buttocks. Stop when you feel a gentle stretch in the front of your knee or thigh (flexion). 3. Hold this position for __________ seconds. 4. Slowly slide your left / right heel back to the starting position. Repeat __________ times. Complete this exercise __________ times a day. Quadriceps stretch, prone  1. Lie on your abdomen on a firm surface, such as a bed or padded floor. 2. Bend your left / right knee and hold  your ankle. If you cannot reach your ankle or pant leg, loop a belt around your foot and grab the belt instead. 3. Gently pull your heel toward your buttocks. Your knee should not slide out to the side. You should feel a stretch in the front of your thigh and knee (quadriceps). 4. Hold this position for __________ seconds. Repeat __________ times. Complete this exercise __________ times a day. Hamstring, supine 1. Lie on your back (supine position). 2. Loop a belt or towel over the ball of your left / right foot. The ball of your foot is on the walking surface, right under your toes. 3. Straighten your left / right knee and slowly pull on the belt to raise your leg until you feel a gentle stretch behind your knee (hamstring). ? Do not let your knee bend while you do this. ? Keep your other leg flat on the floor. 4. Hold this position for __________ seconds. Repeat __________ times. Complete this exercise __________ times a day. Strengthening exercises These exercises build strength and endurance in your knee. Endurance is the ability to use your muscles for a long time, even after they get tired. Quadriceps, isometric This exercise stretches the muscles in front of your thigh (quadriceps) without moving your knee joint (isometric). 1. Lie on your back with your left / right leg extended and your other knee bent. Put a rolled towel or small pillow under your knee if told by your health care provider. 2. Slowly tense the muscles in the front of your left /   right thigh. You should see your kneecap slide up toward your hip or see increased dimpling just above the knee. This motion will push the back of the knee toward the floor. 3. For __________ seconds, hold the muscle as tight as you can without increasing your pain. 4. Relax the muscles slowly and completely. Repeat __________ times. Complete this exercise __________ times a day. Straight leg raises This exercise stretches the muscles in front  of your thigh (quadriceps) and the muscles that move your hips (hip flexors). 1. Lie on your back with your left / right leg extended and your other knee bent. 2. Tense the muscles in the front of your left / right thigh. You should see your kneecap slide up or see increased dimpling just above the knee. Your thigh may even shake a bit. 3. Keep these muscles tight as you raise your leg 4-6 inches (10-15 cm) off the floor. Do not let your knee bend. 4. Hold this position for __________ seconds. 5. Keep these muscles tense as you lower your leg. 6. Relax your muscles slowly and completely after each repetition. Repeat __________ times. Complete this exercise __________ times a day. Hamstring, isometric 1. Lie on your back on a firm surface. 2. Bend your left / right knee about __________ degrees. 3. Dig your left / right heel into the surface as if you are trying to pull it toward your buttocks. Tighten the muscles in the back of your thighs (hamstring) to "dig" as hard as you can without increasing any pain. 4. Hold this position for __________ seconds. 5. Release the tension gradually and allow your muscles to relax completely for __________ seconds after each repetition. Repeat __________ times. Complete this exercise __________ times a day. Hamstring curls If told by your health care provider, do this exercise while wearing ankle weights. Begin with __________ lb weights. Then increase the weight by 1 lb (0.5 kg) increments. Do not wear ankle weights that are more than __________ lb. 1. Lie on your abdomen with your legs straight. 2. Bend your left / right knee as far as you can without feeling pain. Keep your hips flat against the floor. 3. Hold this position for __________ seconds. 4. Slowly lower your leg to the starting position. Repeat __________ times. Complete this exercise __________ times a day. Squats This exercise strengthens the muscles in front of your thigh and knee  (quadriceps). 1. Stand in front of a table, with your feet and knees pointing straight ahead. You may rest your hands on the table for balance but not for support. 2. Slowly bend your knees and lower your hips like you are going to sit in a chair. ? Keep your weight over your heels, not over your toes. ? Keep your lower legs upright so they are parallel with the table legs. ? Do not let your hips go lower than your knees. ? Do not bend lower than told by your health care provider. ? If your knee pain increases, do not bend as low. 3. Hold the squat position for __________ seconds. 4. Slowly push with your legs to return to standing. Do not use your hands to pull yourself to standing. Repeat __________ times. Complete this exercise __________ times a day. Wall slides This exercise strengthens the muscles in front of your thigh and knee (quadriceps). 1. Lean your back against a smooth wall or door, and walk your feet out 18-24 inches (46-61 cm) from it. 2. Place your feet hip-width apart. 3.   Slowly slide down the wall or door until your knees bend __________ degrees. Keep your knees over your heels, not over your toes. Keep your knees in line with your hips. 4. Hold this position for __________ seconds. Repeat __________ times. Complete this exercise __________ times a day. Straight leg raises This exercise strengthens the muscles that rotate the leg at the hip and move it away from your body (hip abductors). 1. Lie on your side with your left / right leg in the top position. Lie so your head, shoulder, knee, and hip line up. You may bend your bottom knee to help you keep your balance. 2. Roll your hips slightly forward so your hips are stacked directly over each other and your left / right knee is facing forward. 3. Leading with your heel, lift your top leg 4-6 inches (10-15 cm). You should feel the muscles in your outer hip lifting. ? Do not let your foot drift forward. ? Do not let your knee  roll toward the ceiling. 4. Hold this position for __________ seconds. 5. Slowly return your leg to the starting position. 6. Let your muscles relax completely after each repetition. Repeat __________ times. Complete this exercise __________ times a day. Straight leg raises This exercise stretches the muscles that move your hips away from the front of the pelvis (hip extensors). 1. Lie on your abdomen on a firm surface. You can put a pillow under your hips if that is more comfortable. 2. Tense the muscles in your buttocks and lift your left / right leg about 4-6 inches (10-15 cm). Keep your knee straight as you lift your leg. 3. Hold this position for __________ seconds. 4. Slowly lower your leg to the starting position. 5. Let your leg relax completely after each repetition. Repeat __________ times. Complete this exercise __________ times a day. This information is not intended to replace advice given to you by your health care provider. Make sure you discuss any questions you have with your health care provider. Document Revised: 05/04/2018 Document Reviewed: 05/04/2018 Elsevier Patient Education  2020 [employer]  

## 2020-02-17 ENCOUNTER — Ambulatory Visit: Payer: Self-pay | Admitting: Legal Medicine

## 2020-03-01 ENCOUNTER — Ambulatory Visit (INDEPENDENT_AMBULATORY_CARE_PROVIDER_SITE_OTHER): Payer: PPO | Admitting: Legal Medicine

## 2020-03-01 ENCOUNTER — Encounter: Payer: Self-pay | Admitting: Legal Medicine

## 2020-03-01 ENCOUNTER — Other Ambulatory Visit: Payer: Self-pay

## 2020-03-01 VITALS — BP 122/60 | HR 93 | Temp 98.0°F | Resp 17 | Ht 62.0 in | Wt 162.0 lb

## 2020-03-01 DIAGNOSIS — M1712 Unilateral primary osteoarthritis, left knee: Secondary | ICD-10-CM | POA: Diagnosis not present

## 2020-03-01 DIAGNOSIS — M170 Bilateral primary osteoarthritis of knee: Secondary | ICD-10-CM | POA: Diagnosis not present

## 2020-03-01 DIAGNOSIS — M1711 Unilateral primary osteoarthritis, right knee: Secondary | ICD-10-CM

## 2020-03-01 NOTE — Progress Notes (Signed)
Subjective:  Patient ID: Sandra Lynch, female    DOB: [birth year]  Age: 85 y.o. MRN: [id number]  Chief Complaint  Patient presents with  . Knee Pain    Both knees pain    HDQ:[id number] both knees.  This is a chronic condition and due to cardiac problems is not a candidate for TKA.  She is in a whee chair and can take a few steps. She requests knee injections.   Current Outpatient Medications on File Prior to Visit  Medication Sig Dispense Refill  . albuterol (VENTOLIN HFA) 108 (90 Base) MCG/ACT inhaler Inhale 2 puffs into the lungs every 6 (six) hours as needed for wheezing or shortness of breath. 8 g 2  . alendronate (FOSAMAX) 70 MG tablet TAKE 1 TABLET BY MOUTH ONCE WEEKLY IN THE MORNING, AT LEAST 30 MIN BEFORE FIRST FOOD, BEVERAGE, OR MEDICATION OF DAY 12 tablet 2  . ALPRAZolam (XANAX) 0.25 MG tablet Take 1 tablet (0.25 mg total) by mouth 2 (two) times daily as needed for anxiety. 90 tablet 3  . amLODipine (NORVASC) 5 MG tablet Take 1 tablet (5 mg total) by mouth every evening. 30 tablet 3  . aspirin EC 81 MG tablet Take 1 tablet by mouth daily.    . Calcium Carbonate-Vitamin D (CALTRATE 600+D PO) Take 1 tablet by mouth daily.    . fluticasone (FLONASE) 50 MCG/ACT nasal spray SPRAY 1 SPRAY INTO EACH NOSTRIL EVERY DAY 16 mL 3  . furosemide (LASIX) 40 MG tablet Take 1 tablet (40 mg total) by mouth daily. 30 tablet 3  . mirabegron ER (MYRBETRIQ) 25 MG TB24 tablet Take 1 tablet (25 mg total) by mouth daily. 90 tablet 2  . mirtazapine (REMERON) 30 MG tablet TAKE 1 TABLET BY MOUTH AT BEDTIME. 90 tablet 2  . Multiple Vitamins-Minerals (CENTRUM SILVER 50+WOMEN PO) Take 1 tablet by mouth daily.     . naproxen sodium (ALEVE) 220 MG tablet Take 220 mg by mouth at bedtime.    . Omega-3 Fatty Acids (FISH OIL) 1200 MG CAPS Take 1 tablet by mouth daily.     Marland Kitchen omeprazole (PRILOSEC) 40 MG capsule TAKE 1 CAPSULE DAILY 90 capsule 2  . pramoxine-hydrocortisone (PROCTOCREAM-HC) 1-1 % rectal cream  Place rectally 3 (three) times daily. 30 g 2  . pravastatin (PRAVACHOL) 40 MG tablet TAKE 1 TABLET BY MOUTH EVERY DAY 90 tablet 2  . sodium chloride (OCEAN) 0.65 % SOLN nasal spray Place 1 spray into both nostrils as needed for congestion.    . traMADol (ULTRAM) 50 MG tablet Take 1 tablet (50 mg total) by mouth every 6 (six) hours as needed. 120 tablet 2  . valsartan (DIOVAN) 40 MG tablet Take 1 tablet (40 mg total) by mouth daily. for blood pressure 90 tablet 2   No current facility-administered medications on file prior to visit.   Past Medical History:  Diagnosis Date  . Allergic rhinitis   . Anxiety   . Arthritis    "scattered in my joints" (02/11/2018)  . Essential hypertension   . GERD (gastroesophageal reflux disease)   . Hyperlipidemia   . Hyponatremia   . Pneumonia    "as a child"   Past Surgical History:  Procedure Laterality Date  . APPENDECTOMY    . BUNIONECTOMY WITH HAMMERTOE RECONSTRUCTION Right 02/11/2018  . CARDIOVASCULAR STRESS TEST     01/25/18 [facility] No reversible defects, fixed defect of mod-size in the septal wall mid ventricle to base, hypokinetic septal wall from mid  ventricle to base, LVEF 50%, low risk  . HALLUX FUSION Right 02/11/2018   Procedure: HALLUX MPJ FUSION RIGHT FOOT;  Surgeon: [name], DPM;  Location: [place];  Service: Podiatry;  Laterality: Right;  . HAMMER TOE SURGERY Right 02/11/2018   Procedure: RIGHT HAMMER TOE CORRECTION SECOND, THIRD, FOURTH AND FIFTH;  Surgeon: [name], DPM;  Location: [place];  Service: Podiatry;  Laterality: Right;  . METATARSAL HEAD EXCISION Right 02/11/2018   Procedure: RIGHT PAN METATARSAL HEAD RESECTION;  Surgeon: [name], DPM;  Location: [place];  Service: Podiatry;  Laterality: Right;  . TOTAL ABDOMINAL HYSTERECTOMY    . TRANSTHORACIC ECHOCARDIOGRAM     01/24/18 [facility]): LV systolic function normal, EF 55-60%, impaired relaxation, LA mildly dilated, trace-mild MR, trace TR      Family History  Problem Relation Age of Onset  . Pneumonia Mother   . Other Mother 44       Pneumonia  . Heart attack Father   . Hypertension Father   . Other Brother        Oncologist   Social History   Socioeconomic History  . Marital status: Widowed    Spouse name: Not on file  . Number of children: 2  . Years of education: Not on file  . Highest education level: Not on file  Occupational History  . Occupation: retired    Comment: Engineer, mining  Tobacco Use  . Smoking status: Never Smoker  . Smokeless tobacco: Never Used  Vaping Use  . Vaping Use: Never used  Substance and Sexual Activity  . Alcohol use: Not Currently    Comment: 02/11/2018 "couple driinks/month"  . Drug use: Never  . Sexual activity: Not Currently  Other Topics Concern  . Not on file  Social History Narrative  . Not on file   Social Determinants of Health   Financial Resource Strain:   . Difficulty of Paying Living Expenses:   Food Insecurity: No Food Insecurity  . Worried About Charity fundraiser in the Last Year: Never true  . Ran Out of Food in the Last Year: Never true  Transportation Needs: No Transportation Needs  . Lack of Transportation (Medical): No  . Lack of Transportation (Non-Medical): No  Physical Activity:   . Days of Exercise per Week:   . Minutes of Exercise per Session:   Stress:   . Feeling of Stress :   Social Connections:   . Frequency of Communication with Friends and Family:   . Frequency of Social Gatherings with Friends and Family:   . Attends Religious Services:   . Active Member of Clubs or Organizations:   . Attends Archivist Meetings:   Marland Kitchen Marital Status:     Review of Systems  Constitutional: Negative.   HENT: Negative.   Respiratory: Negative.   Cardiovascular: Negative.   Gastrointestinal: Negative.   Genitourinary: Negative.   Musculoskeletal: Positive for arthralgias.  Neurological: Positive for weakness.      Objective:  BP 122/60   Pulse 93   Temp 98 F (36.7 C) (Temporal)   Resp 17   Ht 5\' 2"  (1.575 m)   Wt 162 lb (73.5 kg)   SpO2 97%   BMI 29.63 kg/m   BP/Weight 03/01/2020 02/16/2020 0/24/0973  Systolic BP 532 992 426  Diastolic BP 60 68 80  Wt. (Lbs) 162 - -  BMI 29.63 - 29.63    Physical Exam Vitals reviewed.  Constitutional:  Appearance: Normal appearance. She is obese.  Cardiovascular:     Rate and Rhythm: Normal rate and regular rhythm.     Pulses: Normal pulses.     Heart sounds: Normal heart sounds.  Pulmonary:     Effort: Pulmonary effort is normal.     Breath sounds: Normal breath sounds.  Musculoskeletal:     Right knee: Swelling, deformity and crepitus present. Decreased range of motion. Tenderness present over the medial joint line and lateral joint line.     Instability Tests: Anterior drawer test negative. Medial McMurray test negative and lateral McMurray test negative.     Left knee: Swelling, deformity and crepitus present. Decreased range of motion. Tenderness present over the medial joint line and lateral joint line.     Instability Tests: Anterior drawer test positive. Lateral McMurray test positive. Medial McMurray test negative.       Legs:     Comments: severe osteoarthritis both knees  Neurological:     Mental Status: She is alert.     Diabetic Foot Exam - Simple   No data filed       Lab Results  Component Value Date   WBC 7.0 12/12/2019   HGB 13.8 12/12/2019   HCT 41.6 12/12/2019   PLT 293 12/12/2019   GLUCOSE 93 02/07/2020   CHOL 189 12/12/2019   TRIG 71 12/12/2019   HDL 69 12/12/2019   LDLCALC 107 (H) 12/12/2019   ALT 20 12/12/2019   AST 20 12/12/2019   NA 138 02/07/2020   K 4.4 02/07/2020   CL 95 (L) 02/07/2020   CREATININE 0.63 02/07/2020   BUN 16 02/07/2020   CO2 27 02/07/2020   HGBA1C 6.0 (H) 09/06/2019      Assessment & Plan:   1. Primary osteoarthritis of both knees  Patient has severe osteoarthritis  both knees with pr0gressive pain in knee instability.  Both knees injected   After consent was obtained, using sterile technique the both knees was prepped and plain Ethyl Chloride was used as local anesthetic. The joint was entered 160 Steroid total, 80mg  per knee mg and 6 ml plain Lidocaine , 3 cc each knee, was then injected and the needle withdrawn.  The procedure was well tolerated.  The patient is asked to continue to rest the joint for a few more days before resuming regular activities.  It may be more painful for the first 1-2 days.  Watch for fever, or increased swelling or persistent pain in the joint. Call or return to clinic prn if such symptoms occur or there is failure to improve as anticipated.       Follow-up: Return if symptoms worsen or fail to improve.  An After Visit Summary was printed and given to the patient.  [place] [phone]

## 2020-03-11 ENCOUNTER — Other Ambulatory Visit: Payer: Self-pay | Admitting: Legal Medicine

## 2020-03-20 ENCOUNTER — Other Ambulatory Visit: Payer: Self-pay

## 2020-03-20 ENCOUNTER — Ambulatory Visit (INDEPENDENT_AMBULATORY_CARE_PROVIDER_SITE_OTHER): Payer: PPO | Admitting: Legal Medicine

## 2020-03-20 ENCOUNTER — Encounter: Payer: Self-pay | Admitting: Legal Medicine

## 2020-03-20 VITALS — BP 130/80 | HR 85 | Temp 98.0°F | Resp 16 | Ht 62.0 in | Wt 155.0 lb

## 2020-03-20 DIAGNOSIS — E782 Mixed hyperlipidemia: Secondary | ICD-10-CM | POA: Diagnosis not present

## 2020-03-20 DIAGNOSIS — E1142 Type 2 diabetes mellitus with diabetic polyneuropathy: Secondary | ICD-10-CM

## 2020-03-20 DIAGNOSIS — G629 Polyneuropathy, unspecified: Secondary | ICD-10-CM

## 2020-03-20 DIAGNOSIS — N3946 Mixed incontinence: Secondary | ICD-10-CM

## 2020-03-20 DIAGNOSIS — E871 Hypo-osmolality and hyponatremia: Secondary | ICD-10-CM

## 2020-03-20 DIAGNOSIS — M810 Age-related osteoporosis without current pathological fracture: Secondary | ICD-10-CM | POA: Diagnosis not present

## 2020-03-20 DIAGNOSIS — G301 Alzheimer's disease with late onset: Secondary | ICD-10-CM | POA: Diagnosis not present

## 2020-03-20 DIAGNOSIS — K2960 Other gastritis without bleeding: Secondary | ICD-10-CM | POA: Diagnosis not present

## 2020-03-20 DIAGNOSIS — F0281 Dementia in other diseases classified elsewhere with behavioral disturbance: Secondary | ICD-10-CM

## 2020-03-20 DIAGNOSIS — G2581 Restless legs syndrome: Secondary | ICD-10-CM

## 2020-03-20 DIAGNOSIS — F22 Delusional disorders: Secondary | ICD-10-CM

## 2020-03-20 DIAGNOSIS — M1711 Unilateral primary osteoarthritis, right knee: Secondary | ICD-10-CM

## 2020-03-20 DIAGNOSIS — F324 Major depressive disorder, single episode, in partial remission: Secondary | ICD-10-CM

## 2020-03-20 DIAGNOSIS — I10 Essential (primary) hypertension: Secondary | ICD-10-CM

## 2020-03-20 DIAGNOSIS — F02818 Dementia in other diseases classified elsewhere, unspecified severity, with other behavioral disturbance: Secondary | ICD-10-CM

## 2020-03-20 NOTE — Progress Notes (Addendum)
Subjective:  Patient ID: Sandra Lynch, female    DOB: 05-21-1935  Age: 85 y.o. MRN: [id number]  Chief Complaint  Patient presents with  . Hyperlipidemia  . Hypertension  . Diabetes    HPI: chronic visit  Patient presents for follow up of hypertension.  Patient tolerating valsartan well with side effects.  Patient was diagnosed with hypertension 2010 so has been treated for hypertension for 10 years.Patient is working on maintaining diet and exercise regimen and follows up as directed. Complication include none.  Patient present with type 2 diabetes.  Specifically, this is type 2, noninsulin requiring diabetes, complicated by polyneuropathy.  Compliance with treatment has been good; patient take medicines as directed, maintains diet and exercise regimen, follows up as directed, and is keeping glucose diary.  Date of  diagnosis 2010.  Depression screen has been performed.Tobacco screen nonsmoker. Current medicines for diabetes none.  Patient is on valsartan for renal protection and pravastatin for cholesterol control.  Patient performs foot exams daily and last ophthalmologic exam was none.  Patient presents with hyperlipidemia.  Compliance with treatment has been good; patient takes medicines as directed, maintains low cholesterol diet, follows up as directed, and maintains exercise regimen.  Patient is using pravastatin without problems.   Current Outpatient Medications on File Prior to Visit  Medication Sig Dispense Refill  . albuterol (VENTOLIN HFA) 108 (90 Base) MCG/ACT inhaler Inhale 2 puffs into the lungs every 6 (six) hours as needed for wheezing or shortness of breath. 8 g 2  . alendronate (FOSAMAX) 70 MG tablet TAKE 1 TABLET BY MOUTH ONCE WEEKLY IN THE MORNING, AT LEAST 30 MIN BEFORE FIRST FOOD, BEVERAGE, OR MEDICATION OF DAY 12 tablet 2  . ALPRAZolam (XANAX) 0.25 MG tablet Take 1 tablet (0.25 mg total) by mouth 2 (two) times daily as needed for anxiety. 90 tablet 3  . amLODipine  (NORVASC) 5 MG tablet Take 1 tablet (5 mg total) by mouth every evening. 30 tablet 3  . aspirin EC 81 MG tablet Take 1 tablet by mouth daily.    . Calcium Carbonate-Vitamin D (CALTRATE 600+D PO) Take 1 tablet by mouth daily.    . fluticasone (FLONASE) 50 MCG/ACT nasal spray SPRAY 1 SPRAY INTO EACH NOSTRIL EVERY DAY 16 mL 3  . furosemide (LASIX) 40 MG tablet Take 1 tablet (40 mg total) by mouth daily. 30 tablet 3  . mirabegron ER (MYRBETRIQ) 25 MG TB24 tablet Take 1 tablet (25 mg total) by mouth daily. 90 tablet 2  . mirtazapine (REMERON) 30 MG tablet TAKE 1 TABLET BY MOUTH AT BEDTIME. 90 tablet 2  . Multiple Vitamins-Minerals (CENTRUM SILVER 50+WOMEN PO) Take 1 tablet by mouth daily.     . naproxen sodium (ALEVE) 220 MG tablet Take 220 mg by mouth at bedtime.    . Omega-3 Fatty Acids (FISH OIL) 1200 MG CAPS Take 1 tablet by mouth daily.     Marland Kitchen omeprazole (PRILOSEC) 40 MG capsule TAKE 1 CAPSULE DAILY 90 capsule 2  . pramoxine-hydrocortisone (PROCTOCREAM-HC) 1-1 % rectal cream Place rectally 3 (three) times daily. 30 g 2  . pravastatin (PRAVACHOL) 40 MG tablet TAKE 1 TABLET BY MOUTH EVERY DAY 90 tablet 2  . sodium chloride (OCEAN) 0.65 % SOLN nasal spray Place 1 spray into both nostrils as needed for congestion.    . traMADol (ULTRAM) 50 MG tablet Take 1 tablet (50 mg total) by mouth every 6 (six) hours as needed. 120 tablet 2  . valsartan (DIOVAN) 40 MG  tablet Take 1 tablet (40 mg total) by mouth daily. for blood pressure 90 tablet 2   No current facility-administered medications on file prior to visit.   Past Medical History:  Diagnosis Date  . Allergic rhinitis   . Anxiety   . Arthritis    "scattered in my joints" (02/11/2018)  . Essential hypertension   . GERD (gastroesophageal reflux disease)   . Hyperlipidemia   . Hyponatremia   . Pneumonia    "as a child"   Past Surgical History:  Procedure Laterality Date  . APPENDECTOMY    . BUNIONECTOMY WITH HAMMERTOE RECONSTRUCTION Right  02/11/2018  . CARDIOVASCULAR STRESS TEST     01/25/18 [facility]): No reversible defects, fixed defect of mod-size in the septal wall mid ventricle to base, hypokinetic septal wall from mid ventricle to base, LVEF 50%, low risk  . HALLUX FUSION Right 02/11/2018   Procedure: HALLUX MPJ FUSION RIGHT FOOT;  Surgeon: [name], DPM;  Location: [place];  Service: Podiatry;  Laterality: Right;  . HAMMER TOE SURGERY Right 02/11/2018   Procedure: RIGHT HAMMER TOE CORRECTION SECOND, THIRD, FOURTH AND FIFTH;  Surgeon: [name], DPM;  Location: [place];  Service: Podiatry;  Laterality: Right;  . METATARSAL HEAD EXCISION Right 02/11/2018   Procedure: RIGHT PAN METATARSAL HEAD RESECTION;  Surgeon: [name], DPM;  Location: [place];  Service: Podiatry;  Laterality: Right;  . TOTAL ABDOMINAL HYSTERECTOMY    . TRANSTHORACIC ECHOCARDIOGRAM     01/24/18 [facility]): LV systolic function normal, EF 55-60%, impaired relaxation, LA mildly dilated, trace-mild MR, trace TR    Family History  Problem Relation Age of Onset  . Pneumonia Mother   . Other Mother 69       Pneumonia  . Heart attack Father   . Hypertension Father   . Other Brother        Oncologist   Social History   Socioeconomic History  . Marital status: Widowed    Spouse name: Not on file  . Number of children: 2  . Years of education: Not on file  . Highest education level: Not on file  Occupational History  . Occupation: retired    Comment: Engineer, mining  Tobacco Use  . Smoking status: Never Smoker  . Smokeless tobacco: Never Used  Vaping Use  . Vaping Use: Never used  Substance and Sexual Activity  . Alcohol use: Not Currently    Comment: 02/11/2018 "couple driinks/month"  . Drug use: Never  . Sexual activity: Not Currently  Other Topics Concern  . Not on file  Social History Narrative  . Not on file   Social Determinants of Health   Financial Resource Strain:   . Difficulty of Paying Living  Expenses: Not on file  Food Insecurity: No Food Insecurity  . Worried About Charity fundraiser in the Last Year: Never true  . Ran Out of Food in the Last Year: Never true  Transportation Needs: No Transportation Needs  . Lack of Transportation (Medical): No  . Lack of Transportation (Non-Medical): No  Physical Activity:   . Days of Exercise per Week: Not on file  . Minutes of Exercise per Session: Not on file  Stress:   . Feeling of Stress : Not on file  Social Connections:   . Frequency of Communication with Friends and Family: Not on file  . Frequency of Social Gatherings with Friends and Family: Not on file  . Attends Religious Services:  Not on file  . Active Member of Clubs or Organizations: Not on file  . Attends Archivist Meetings: Not on file  . Marital Status: Not on file    Review of Systems  Constitutional: Negative.   HENT: Negative.   Eyes: Negative.   Respiratory: Negative.  Negative for cough, choking and chest tightness.   Cardiovascular: Negative for chest pain.  Gastrointestinal: Negative.  Negative for abdominal pain.  Endocrine: Negative.   Genitourinary: Positive for difficulty urinating and dysuria.  Musculoskeletal: Positive for arthralgias.  Neurological: Negative.   Psychiatric/Behavioral: Negative.      Objective:  BP 130/80   Pulse 85   Temp 98 F (36.7 C)   Resp 16   Ht 5\' 2"  (1.575 m)   Wt 155 lb (70.3 kg)   SpO2 97%   BMI 28.35 kg/m   BP/Weight 03/20/2020 03/01/2020 5/39/7673  Systolic BP 419 379 024  Diastolic BP 80 60 68  Wt. (Lbs) 155 162 -  BMI 28.35 29.63 -    Physical Exam Vitals reviewed.  Constitutional:      Appearance: Normal appearance. She is obese.  HENT:     Head: Normocephalic and atraumatic.     Right Ear: Tympanic membrane, ear canal and external ear normal.     Left Ear: Tympanic membrane, ear canal and external ear normal.     Nose: Nose normal.     Mouth/Throat:     Mouth: Mucous membranes are  moist.     Pharynx: Oropharynx is clear.  Eyes:     Extraocular Movements: Extraocular movements intact.     Conjunctiva/sclera: Conjunctivae normal.     Pupils: Pupils are equal, round, and reactive to light.  Cardiovascular:     Rate and Rhythm: Normal rate and regular rhythm.     Pulses: Normal pulses.     Heart sounds: Normal heart sounds.  Pulmonary:     Effort: Pulmonary effort is normal.     Breath sounds: Normal breath sounds.  Abdominal:     General: Abdomen is flat. Bowel sounds are normal.     Palpations: Abdomen is soft.  Musculoskeletal:        General: Normal range of motion.     Cervical back: Normal range of motion.     Comments: Severe arthritis right knee , pain on ROM  Skin:    General: Skin is warm and dry.  Neurological:     General: No focal deficit present.     Mental Status: She is alert.     Diabetic Foot Exam - Simple   Simple Foot Form Diabetic Foot exam was performed with the following findings: Yes 03/20/2020 11:44 AM  Visual Inspection See comments: Yes Sensation Testing See comments: Yes Pulse Check Posterior Tibialis and Dorsalis pulse intact bilaterally: Yes Comments Bunions both feet with crossover of toes.      Lab Results  Component Value Date   WBC 7.0 12/12/2019   HGB 13.8 12/12/2019   HCT 41.6 12/12/2019   PLT 293 12/12/2019   GLUCOSE 93 02/07/2020   CHOL 189 12/12/2019   TRIG 71 12/12/2019   HDL 69 12/12/2019   LDLCALC 107 (H) 12/12/2019   ALT 20 12/12/2019   AST 20 12/12/2019   NA 138 02/07/2020   K 4.4 02/07/2020   CL 95 (L) 02/07/2020   CREATININE 0.63 02/07/2020   BUN 16 02/07/2020   CO2 27 02/07/2020   HGBA1C 6.0 (H) 09/06/2019      Assessment &  Plan:   1. Essential hypertension - CBC with Differential/Platelet - Comprehensive metabolic panel An individual hypertension care plan was established and reinforced today.  The patient's status was assessed using clinical findings on exam and labs or  diagnostic tests. The patient's success at meeting treatment goals on disease specific evidence-based guidelines and found to be well controlled. SELF MANAGEMENT: The patient and I together assessed ways to personally work towards obtaining the recommended goals. RECOMMENDATIONS: avoid decongestants found in common cold remedies, decrease consumption of alcohol, perform routine monitoring of BP with home BP cuff, exercise, reduction of dietary salt, take medicines as prescribed, try not to miss doses and quit smoking.  Regular exercise and maintaining a healthy weight is needed.  Stress reduction may help. A CLINICAL SUMMARY including written plan identify barriers to care unique to individual due to social or financial issues.  We attempt to mutually creat solutions for individual and family understanding.  2. Mixed hyperlipidemia - Lipid panel AN INDIVIDUAL CARE PLAN for hyperlipidemia/ cholesterol was established and reinforced today.  The patient's status was assessed using clinical findings on exam, lab and other diagnostic tests. The patient's disease status was assessed based on evidence-based guidelines and found to be well controlled. MEDICATIONS were reviewed. SELF MANAGEMENT GOALS have been discussed and patient's success at attaining the goal of low cholesterol was assessed. RECOMMENDATION given include regular exercise 3 days a week and low cholesterol/low fat diet. CLINICAL SUMMARY including written plan to identify barriers unique to the patient due to social or economic  reasons was discussed.  3. Diabetic polyneuropathy associated with type 2 diabetes mellitus (HCC) - Hemoglobin A1c An individual care plan for diabetes was established and reinforced today.  The patient's status was assessed using clinical findings on exam, labs and diagnostic testing. Patient success at meeting goals based on disease specific evidence-based guidelines and found to be good controlled. Medications were  assessed and patient's understanding of the medical issues , including barriers were assessed. Recommend adherence to a diabetic diet, a graduated exercise program, HgbA1c level is checked quarterly, and urine microalbumin performed yearly .  Annual mono-filament sensation testing performed. Lower blood pressure and control hyperlipidemia is important. Get annual eye exams and annual flu shots and smoking cessation discussed.  Self management goals were discussed.  4. Major depressive disorder with single episode, in partial remission ([place]) Patient's depression is controlled with none.   Anhedonia better.  PHQ 9 was not perfored score na. An individual care plan was established or reinforced today.  The patient's disease status was assessed using clinical findings on exam, labs, and or other diagnostic testing to determine patient's success in meeting treatment goals based on disease specific evidence-based guidelines and found to be improving Recommendations include no medicines  5. Gastritis medicamentosa AN INDIVIDUAL CARE PLAN gastritis was established and reinforced today.  The patient's status was assessed using clinical findings on exam, labs, and other diagnostic testing. Patient's success at meeting treatment goals based on disease specific evidence-bassed guidelines and found to be in good control. RECOMMENDATIONS include maintain present medicines and treatment.  6. Polyneuropathy From DM with numbness both feet  7. Mixed incontinence Patient uses diaper and myrbetric  8. Dementia of the Alzheimer's type, with late onset, with delusions ([place]) Patient's dementia is stable MMSE remains 28/30.  He remains dependent with supervision for medicines.  An individual plan was formulated for this patient based on patient history and physical exam, x-rays an other tests.  Evidence based decisions made.  9. Restless leg AN INDIVIDUAL CARE PLAN restless leg was established and reinforced today.   The patient's status was assessed using clinical findings on exam, labs, and other diagnostic testing. Patient's success at meeting treatment goals based on disease specific evidence-bassed guidelines and found to be in good control. RECOMMENDATIONS include maintaining present medicines and treatment.   10. Age-related osteoporosis without current pathological fracture AN INDIVIDUAL CARE PLAN osteoporosis was established and reinforced today.  The patient's status was assessed using clinical findings on exam, labs, and other diagnostic testing. Patient's success at meeting treatment goals based on disease specific evidence-bassed guidelines and found to be in fair control. RECOMMENDATIONS include maintaining present medicines and treatment.   Primary osteoarthritis of right knee iNJECT RIGHT KNEE I offered referral to orthopedics but patient wants to try another kenalog shot.  We discussed at length. After consent was obtained, using sterile technique the right knee was prepped and Ethyl Choride was used as local anesthetic. The joint was entered and no fluid obtained and  Steroid 80 mg and 3 ml plain Lidocaine was then injected and the needle withdrawn.  The procedure was well tolerated.  The patient is asked to continue to rest the joint for a few more days before resuming regular activities.  It may be more painful for the first 1-2 days.  Watch for fever, or increased swelling or persistent pain in the joint. Call or return to clinic prn if such symptoms occur or there is failure to improve as anticipated.     Orders Placed This Encounter  Procedures  . CBC with Differential/Platelet  . Comprehensive metabolic panel  . Lipid panel  . Hemoglobin A1c     Follow-up: Return in about 4 months (around 07/20/2020) for fasting.  An After Visit Summary was printed and given to the patient.  [place] [phone]

## 2020-03-21 LAB — LIPID PANEL
Chol/HDL Ratio: 2.8 ratio (ref 0.0–4.4)
Cholesterol, Total: 210 mg/dL — ABNORMAL HIGH (ref 100–199)
HDL: 74 mg/dL (ref >39)
LDL Chol Calc (NIH): 124 mg/dL — ABNORMAL HIGH (ref 0–99)
Triglycerides: 66 mg/dL (ref 0–149)
VLDL Cholesterol Cal: 12 mg/dL (ref 5–40)

## 2020-03-21 LAB — CBC WITH DIFFERENTIAL/PLATELET
Basophils Absolute: 0 10*3/uL (ref 0.0–0.2)
Basos: 0 %
EOS (ABSOLUTE): 0.1 10*3/uL (ref 0.0–0.4)
Eos: 1 %
Hematocrit: 41.8 % (ref 34.0–46.6)
Hemoglobin: 14 g/dL (ref 11.1–15.9)
Immature Grans (Abs): 0 10*3/uL (ref 0.0–0.1)
Immature Granulocytes: 0 %
Lymphocytes Absolute: 1.4 10*3/uL (ref 0.7–3.1)
Lymphs: 17 %
MCH: 30.9 pg (ref 26.6–33.0)
MCHC: 33.5 g/dL (ref 31.5–35.7)
MCV: 92 fL (ref 79–97)
Monocytes Absolute: 0.9 10*3/uL (ref 0.1–0.9)
Monocytes: 10 %
Neutrophils Absolute: 6.1 10*3/uL (ref 1.4–7.0)
Neutrophils: 72 %
Platelets: 277 10*3/uL (ref 150–450)
RBC: 4.53 x10E6/uL (ref 3.77–5.28)
RDW: 12.6 % (ref 11.7–15.4)
WBC: 8.5 10*3/uL (ref 3.4–10.8)

## 2020-03-21 LAB — COMPREHENSIVE METABOLIC PANEL
ALT: 23 IU/L (ref 0–32)
AST: 18 IU/L (ref 0–40)
Albumin/Globulin Ratio: 1.8 (ref 1.2–2.2)
Albumin: 4.4 g/dL (ref 3.6–4.6)
Alkaline Phosphatase: 70 IU/L (ref 48–121)
BUN/Creatinine Ratio: 31 — ABNORMAL HIGH (ref 12–28)
BUN: 22 mg/dL (ref 8–27)
Bilirubin Total: 0.4 mg/dL (ref 0.0–1.2)
CO2: 29 mmol/L (ref 20–29)
Calcium: 9.9 mg/dL (ref 8.7–10.3)
Chloride: 93 mmol/L — ABNORMAL LOW (ref 96–106)
Creatinine, Ser: 0.7 mg/dL (ref 0.57–1.00)
GFR calc Af Amer: 93 mL/min/{1.73_m2} (ref >59)
GFR calc non Af Amer: 81 mL/min/{1.73_m2} (ref >59)
Globulin, Total: 2.4 g/dL (ref 1.5–4.5)
Glucose: 99 mg/dL (ref 65–99)
Potassium: 4.3 mmol/L (ref 3.5–5.2)
Sodium: 137 mmol/L (ref 134–144)
Total Protein: 6.8 g/dL (ref 6.0–8.5)

## 2020-03-21 LAB — HEMOGLOBIN A1C
Est. average glucose Bld gHb Est-mCnc: 131 mg/dL
Hgb A1c MFr Bld: 6.2 % — ABNORMAL HIGH (ref 4.8–5.6)

## 2020-03-21 LAB — CARDIOVASCULAR RISK ASSESSMENT

## 2020-03-21 NOTE — Progress Notes (Signed)
CBC normal, kidney tests ok, liver tests normal, LDL-124 high, recommend change to crestor 20mg  lp

## 2020-03-21 NOTE — Progress Notes (Signed)
A1c 6.2 OK lp

## 2020-04-08 ENCOUNTER — Other Ambulatory Visit: Payer: Self-pay | Admitting: Legal Medicine

## 2020-04-08 DIAGNOSIS — I10 Essential (primary) hypertension: Secondary | ICD-10-CM

## 2020-04-16 ENCOUNTER — Ambulatory Visit (INDEPENDENT_AMBULATORY_CARE_PROVIDER_SITE_OTHER): Payer: PPO | Admitting: Legal Medicine

## 2020-04-16 ENCOUNTER — Encounter: Payer: Self-pay | Admitting: Legal Medicine

## 2020-04-16 ENCOUNTER — Other Ambulatory Visit: Payer: Self-pay

## 2020-04-16 VITALS — BP 120/70 | Temp 96.5°F | Resp 17 | Ht 62.0 in | Wt 162.0 lb

## 2020-04-16 DIAGNOSIS — L84 Corns and callosities: Secondary | ICD-10-CM | POA: Diagnosis not present

## 2020-04-16 DIAGNOSIS — M6281 Muscle weakness (generalized): Secondary | ICD-10-CM

## 2020-04-16 DIAGNOSIS — M1711 Unilateral primary osteoarthritis, right knee: Secondary | ICD-10-CM | POA: Diagnosis not present

## 2020-04-16 NOTE — Progress Notes (Signed)
Subjective:  Patient ID: Sandra Lynch, female    DOB: 26-Aug-1934  Age: 85 y.o. MRN: [id number]  Chief Complaint  Patient presents with  . Knee Pain    Pain on right knee    HPI: patient fell on knee one month ago onto right knee.  She has not walked since.  She is using bedside commode.  She is weak. Needs at least 1+ assistance and her right knee gives out.  She is in wheel chair now.   Current Outpatient Medications on File Prior to Visit  Medication Sig Dispense Refill  . albuterol (VENTOLIN HFA) 108 (90 Base) MCG/ACT inhaler Inhale 2 puffs into the lungs every 6 (six) hours as needed for wheezing or shortness of breath. 8 g 2  . alendronate (FOSAMAX) 70 MG tablet TAKE 1 TABLET BY MOUTH ONCE WEEKLY IN THE MORNING, AT LEAST 30 MIN BEFORE FIRST FOOD, BEVERAGE, OR MEDICATION OF DAY 12 tablet 2  . ALPRAZolam (XANAX) 0.25 MG tablet Take 1 tablet (0.25 mg total) by mouth 2 (two) times daily as needed for anxiety. 90 tablet 3  . amLODipine (NORVASC) 5 MG tablet Take 1 tablet (5 mg total) by mouth every evening. 30 tablet 3  . aspirin EC 81 MG tablet Take 1 tablet by mouth daily.    . Calcium Carbonate-Vitamin D (CALTRATE 600+D PO) Take 1 tablet by mouth daily.    . fluticasone (FLONASE) 50 MCG/ACT nasal spray SPRAY 1 SPRAY INTO EACH NOSTRIL EVERY DAY 16 mL 3  . furosemide (LASIX) 40 MG tablet TAKE 1 TABLET BY MOUTH EVERY DAY 90 tablet 2  . mirabegron ER (MYRBETRIQ) 25 MG TB24 tablet Take 1 tablet (25 mg total) by mouth daily. 90 tablet 2  . mirtazapine (REMERON) 30 MG tablet TAKE 1 TABLET BY MOUTH AT BEDTIME. 90 tablet 2  . Multiple Vitamins-Minerals (CENTRUM SILVER 50+WOMEN PO) Take 1 tablet by mouth daily.     . naproxen sodium (ALEVE) 220 MG tablet Take 220 mg by mouth at bedtime.    . Omega-3 Fatty Acids (FISH OIL) 1200 MG CAPS Take 1 tablet by mouth daily.     Marland Kitchen omeprazole (PRILOSEC) 40 MG capsule TAKE 1 CAPSULE DAILY 90 capsule 2  . pramoxine-hydrocortisone (PROCTOCREAM-HC) 1-1 %  rectal cream Place rectally 3 (three) times daily. 30 g 2  . pravastatin (PRAVACHOL) 40 MG tablet TAKE 1 TABLET BY MOUTH EVERY DAY 90 tablet 2  . sodium chloride (OCEAN) 0.65 % SOLN nasal spray Place 1 spray into both nostrils as needed for congestion.    . traMADol (ULTRAM) 50 MG tablet Take 1 tablet (50 mg total) by mouth every 6 (six) hours as needed. 120 tablet 2  . valsartan (DIOVAN) 40 MG tablet Take 1 tablet (40 mg total) by mouth daily. for blood pressure 90 tablet 2   No current facility-administered medications on file prior to visit.   Past Medical History:  Diagnosis Date  . Anxiety   . Arthritis    "scattered in my joints" (02/11/2018)  . Essential hypertension   . Hyperlipidemia   . Hyponatremia    Past Surgical History:  Procedure Laterality Date  . APPENDECTOMY    . BUNIONECTOMY WITH HAMMERTOE RECONSTRUCTION Right 02/11/2018  . CARDIOVASCULAR STRESS TEST     01/25/18 [facility]): No reversible defects, fixed defect of mod-size in the septal wall mid ventricle to base, hypokinetic septal wall from mid ventricle to base, LVEF 50%, low risk  . HALLUX FUSION Right 02/11/2018  Procedure: HALLUX MPJ FUSION RIGHT FOOT;  Surgeon: [name], DPM;  Location: [place];  Service: Podiatry;  Laterality: Right;  . HAMMER TOE SURGERY Right 02/11/2018   Procedure: RIGHT HAMMER TOE CORRECTION SECOND, THIRD, FOURTH AND FIFTH;  Surgeon: [name], DPM;  Location: [place];  Service: Podiatry;  Laterality: Right;  . METATARSAL HEAD EXCISION Right 02/11/2018   Procedure: RIGHT PAN METATARSAL HEAD RESECTION;  Surgeon: [name], DPM;  Location: [place];  Service: Podiatry;  Laterality: Right;  . TOTAL ABDOMINAL HYSTERECTOMY    . TRANSTHORACIC ECHOCARDIOGRAM     01/24/18 [facility]): LV systolic function normal, EF 55-60%, impaired relaxation, LA mildly dilated, trace-mild MR, trace TR    Family History  Problem Relation Age of Onset  . Pneumonia Mother   . Other Mother 14        Pneumonia  . Heart attack Father   . Hypertension Father   . Other Brother        Oncologist   Social History   Socioeconomic History  . Marital status: Widowed    Spouse name: Not on file  . Number of children: 2  . Years of education: Not on file  . Highest education level: Not on file  Occupational History  . Occupation: retired    Comment: Engineer, mining  Tobacco Use  . Smoking status: Never Smoker  . Smokeless tobacco: Never Used  Vaping Use  . Vaping Use: Never used  Substance and Sexual Activity  . Alcohol use: Not Currently    Comment: 02/11/2018 "couple driinks/month"  . Drug use: Never  . Sexual activity: Not Currently  Other Topics Concern  . Not on file  Social History Narrative  . Not on file   Social Determinants of Health   Financial Resource Strain:   . Difficulty of Paying Living Expenses: Not on file  Food Insecurity: No Food Insecurity  . Worried About Charity fundraiser in the Last Year: Never true  . Ran Out of Food in the Last Year: Never true  Transportation Needs: No Transportation Needs  . Lack of Transportation (Medical): No  . Lack of Transportation (Non-Medical): No  Physical Activity:   . Days of Exercise per Week: Not on file  . Minutes of Exercise per Session: Not on file  Stress:   . Feeling of Stress : Not on file  Social Connections:   . Frequency of Communication with Friends and Family: Not on file  . Frequency of Social Gatherings with Friends and Family: Not on file  . Attends Religious Services: Not on file  . Active Member of Clubs or Organizations: Not on file  . Attends Archivist Meetings: Not on file  . Marital Status: Not on file    Review of Systems  Constitutional: Negative.   HENT: Negative.   Respiratory: Negative.   Cardiovascular: Positive for leg swelling.  Gastrointestinal: Negative.   Genitourinary: Negative.   Musculoskeletal: Positive for arthralgias (both knees).    Neurological: Positive for tremors.  Psychiatric/Behavioral: Negative.      Objective:  BP 120/70   Temp (!) 96.5 F (35.8 C)   Resp 17   Ht 5\' 2"  (1.575 m)   Wt 162 lb (73.5 kg)   BMI 29.63 kg/m   BP/Weight 04/16/2020 9/62/9528 10/27/3242  Systolic BP 010 272 536  Diastolic BP 70 80 60  Wt. (Lbs) 162 155 162  BMI 29.63 28.35 29.63    Physical Exam  Vitals reviewed.  Constitutional:      Appearance: Normal appearance.  Eyes:     Extraocular Movements: Extraocular movements intact.     Conjunctiva/sclera: Conjunctivae normal.     Pupils: Pupils are equal, round, and reactive to light.  Cardiovascular:     Rate and Rhythm: Normal rate and regular rhythm.     Pulses: Normal pulses.     Heart sounds: Normal heart sounds.  Pulmonary:     Breath sounds: Normal breath sounds.  Abdominal:     General: Abdomen is flat. Bowel sounds are normal.  Neurological:     Mental Status: She is alert.     Motor: Weakness present.       Lab Results  Component Value Date   WBC 8.5 03/20/2020   HGB 14.0 03/20/2020   HCT 41.8 03/20/2020   PLT 277 03/20/2020   GLUCOSE 99 03/20/2020   CHOL 210 (H) 03/20/2020   TRIG 66 03/20/2020   HDL 74 03/20/2020   LDLCALC 124 (H) 03/20/2020   ALT 23 03/20/2020   AST 18 03/20/2020   NA 137 03/20/2020   K 4.3 03/20/2020   CL 93 (L) 03/20/2020   CREATININE 0.70 03/20/2020   BUN 22 03/20/2020   CO2 29 03/20/2020   HGBA1C 6.2 (H) 03/20/2020      Assessment & Plan:   1. Primary osteoarthritis of right knee She is weak in right leg and has trouble bearing weight.  We will set up physical therapy for strengthening. Continue to get her to walk.  The corn on left foot was pared down so it was not tender      Follow-up: Return in about 1 month (around 05/16/2020) for check strength.  An After Visit Summary was printed and given to the patient.  [place] [phone]

## 2020-04-23 DIAGNOSIS — M2041 Other hammer toe(s) (acquired), right foot: Secondary | ICD-10-CM | POA: Diagnosis not present

## 2020-04-23 DIAGNOSIS — Z7982 Long term (current) use of aspirin: Secondary | ICD-10-CM | POA: Diagnosis not present

## 2020-04-23 DIAGNOSIS — M170 Bilateral primary osteoarthritis of knee: Secondary | ICD-10-CM | POA: Diagnosis not present

## 2020-04-23 DIAGNOSIS — M1611 Unilateral primary osteoarthritis, right hip: Secondary | ICD-10-CM | POA: Diagnosis not present

## 2020-04-23 DIAGNOSIS — E785 Hyperlipidemia, unspecified: Secondary | ICD-10-CM | POA: Diagnosis not present

## 2020-04-23 DIAGNOSIS — M216X1 Other acquired deformities of right foot: Secondary | ICD-10-CM | POA: Diagnosis not present

## 2020-04-23 DIAGNOSIS — I4821 Permanent atrial fibrillation: Secondary | ICD-10-CM | POA: Diagnosis not present

## 2020-04-23 DIAGNOSIS — M7071 Other bursitis of hip, right hip: Secondary | ICD-10-CM | POA: Diagnosis not present

## 2020-04-23 DIAGNOSIS — Z7951 Long term (current) use of inhaled steroids: Secondary | ICD-10-CM | POA: Diagnosis not present

## 2020-04-23 DIAGNOSIS — M2011 Hallux valgus (acquired), right foot: Secondary | ICD-10-CM | POA: Diagnosis not present

## 2020-04-23 DIAGNOSIS — Z9181 History of falling: Secondary | ICD-10-CM | POA: Diagnosis not present

## 2020-04-23 DIAGNOSIS — F324 Major depressive disorder, single episode, in partial remission: Secondary | ICD-10-CM | POA: Diagnosis not present

## 2020-04-23 DIAGNOSIS — E1142 Type 2 diabetes mellitus with diabetic polyneuropathy: Secondary | ICD-10-CM | POA: Diagnosis not present

## 2020-04-23 DIAGNOSIS — G301 Alzheimer's disease with late onset: Secondary | ICD-10-CM | POA: Diagnosis not present

## 2020-04-23 DIAGNOSIS — N3946 Mixed incontinence: Secondary | ICD-10-CM | POA: Diagnosis not present

## 2020-04-23 DIAGNOSIS — M8949 Other hypertrophic osteoarthropathy, multiple sites: Secondary | ICD-10-CM | POA: Diagnosis not present

## 2020-04-23 DIAGNOSIS — F419 Anxiety disorder, unspecified: Secondary | ICD-10-CM | POA: Diagnosis not present

## 2020-04-23 DIAGNOSIS — Z791 Long term (current) use of non-steroidal anti-inflammatories (NSAID): Secondary | ICD-10-CM | POA: Diagnosis not present

## 2020-04-23 DIAGNOSIS — I10 Essential (primary) hypertension: Secondary | ICD-10-CM | POA: Diagnosis not present

## 2020-04-23 DIAGNOSIS — M7501 Adhesive capsulitis of right shoulder: Secondary | ICD-10-CM | POA: Diagnosis not present

## 2020-04-23 DIAGNOSIS — M810 Age-related osteoporosis without current pathological fracture: Secondary | ICD-10-CM | POA: Diagnosis not present

## 2020-04-23 DIAGNOSIS — F0280 Dementia in other diseases classified elsewhere without behavioral disturbance: Secondary | ICD-10-CM | POA: Diagnosis not present

## 2020-04-27 DIAGNOSIS — E1142 Type 2 diabetes mellitus with diabetic polyneuropathy: Secondary | ICD-10-CM | POA: Diagnosis not present

## 2020-04-27 DIAGNOSIS — M170 Bilateral primary osteoarthritis of knee: Secondary | ICD-10-CM | POA: Diagnosis not present

## 2020-04-27 DIAGNOSIS — I4821 Permanent atrial fibrillation: Secondary | ICD-10-CM | POA: Diagnosis not present

## 2020-04-29 ENCOUNTER — Other Ambulatory Visit: Payer: Self-pay | Admitting: Legal Medicine

## 2020-04-29 DIAGNOSIS — M2041 Other hammer toe(s) (acquired), right foot: Secondary | ICD-10-CM

## 2020-05-02 ENCOUNTER — Other Ambulatory Visit: Payer: Self-pay

## 2020-05-02 ENCOUNTER — Other Ambulatory Visit: Payer: Self-pay | Admitting: Legal Medicine

## 2020-05-02 DIAGNOSIS — M2011 Hallux valgus (acquired), right foot: Secondary | ICD-10-CM

## 2020-05-02 DIAGNOSIS — M1712 Unilateral primary osteoarthritis, left knee: Secondary | ICD-10-CM

## 2020-05-02 MED ORDER — TRAMADOL HCL 50 MG PO TABS
50.0000 mg | ORAL_TABLET | Freq: Four times a day (QID) | ORAL | 2 refills | Status: AC | PRN
Start: 2020-05-02 — End: ?

## 2020-05-02 MED ORDER — TRAMADOL HCL 50 MG PO TABS: 50.0000 mg | ORAL_TABLET | Freq: Four times a day (QID) | ORAL | 2 refills | Status: DC | PRN

## 2020-05-02 MED ORDER — TRAMADOL HCL 50 MG PO TABS
50.0000 mg | ORAL_TABLET | Freq: Four times a day (QID) | ORAL | 2 refills | Status: DC | PRN
Start: 2020-05-02 — End: 2020-05-02

## 2020-05-10 ENCOUNTER — Telehealth: Payer: Self-pay

## 2020-05-10 NOTE — Chronic Care Management (AMB) (Signed)
Chronic Care Management Pharmacy Assistant   Name: Sandra Lynch  MRN: [id number] DOB: May 15, 1935  Reason for Encounter: Disease State/ Hypertension and Lipids Adherence Call.  Patient Questions:  1.  Have you seen any other providers since your last visit? Yes, OV- 02/16/20 Dr. Reinaldo Meeker (PCP), 03/01/20 Dr. Reinaldo Meeker (PCP), 03/20/20 Dr. Reinaldo Meeker (PCP), 04/16/20  Dr. Reinaldo Meeker (PCP).    2.  Any changes in your medicines or health? Yes, OV 03/01/20 Dr. Reinaldo Meeker (PCP) stopped Furosemide 40 mg.   PCP : Lillard Anes, MD  Allergies:   Allergies  Allergen Reactions  . Pramipexole Other (See Comments)    She cannot walk or focus in anything.   Marland Kitchen Requip [Ropinirole] Shortness Of Breath  . Lisinopril Cough    Medications: Outpatient Encounter Medications as of 05/10/2020  Medication Sig  . albuterol (VENTOLIN HFA) 108 (90 Base) MCG/ACT inhaler Inhale 2 puffs into the lungs every 6 (six) hours as needed for wheezing or shortness of breath.  Marland Kitchen alendronate (FOSAMAX) 70 MG tablet TAKE 1 TABLET BY MOUTH ONCE WEEKLY IN THE MORNING, AT LEAST 30 MIN BEFORE FIRST FOOD, BEVERAGE, OR MEDICATION OF DAY  . ALPRAZolam (XANAX) 0.25 MG tablet Take 1 tablet (0.25 mg total) by mouth 2 (two) times daily as needed for anxiety.  Marland Kitchen amLODipine (NORVASC) 5 MG tablet Take 1 tablet (5 mg total) by mouth every evening.  Marland Kitchen aspirin EC 81 MG tablet Take 1 tablet by mouth daily.  . Calcium Carbonate-Vitamin D (CALTRATE 600+D PO) Take 1 tablet by mouth daily.  . fluticasone (FLONASE) 50 MCG/ACT nasal spray SPRAY 1 SPRAY INTO EACH NOSTRIL EVERY DAY  . furosemide (LASIX) 40 MG tablet TAKE 1 TABLET BY MOUTH EVERY DAY  . mirabegron ER (MYRBETRIQ) 25 MG TB24 tablet Take 1 tablet (25 mg total) by mouth daily.  . mirtazapine (REMERON) 30 MG tablet TAKE 1 TABLET BY MOUTH AT BEDTIME.  . Multiple Vitamins-Minerals (CENTRUM SILVER 50+WOMEN PO) Take 1 tablet by mouth daily.   . naproxen sodium  (ALEVE) 220 MG tablet Take 220 mg by mouth at bedtime.  . Omega-3 Fatty Acids (FISH OIL) 1200 MG CAPS Take 1 tablet by mouth daily.   Marland Kitchen omeprazole (PRILOSEC) 40 MG capsule TAKE 1 CAPSULE DAILY  . pramoxine-hydrocortisone (PROCTOCREAM-HC) 1-1 % rectal cream Place rectally 3 (three) times daily.  . pravastatin (PRAVACHOL) 40 MG tablet TAKE 1 TABLET BY MOUTH EVERY DAY  . sodium chloride (OCEAN) 0.65 % SOLN nasal spray Place 1 spray into both nostrils as needed for congestion.  . traMADol (ULTRAM) 50 MG tablet Take 1 tablet (50 mg total) by mouth every 6 (six) hours as needed.  . valsartan (DIOVAN) 40 MG tablet Take 1 tablet (40 mg total) by mouth daily. for blood pressure   No facility-administered encounter medications on file as of 05/10/2020.    Current Diagnosis: Patient Active Problem List   Diagnosis Date Noted  . Corn 04/16/2020  . Pedal edema 01/31/2020  . Plantar wart of left foot 10/19/2019  . Degenerative arthritis of right knee 09/19/2019  . Adhesive capsulitis of right shoulder 09/06/2019  . Restless leg 09/06/2019  . Longstanding persistent atrial fibrillation ([place]) 09/05/2019  . Osteoporosis 09/05/2019  . Dementia of the Alzheimer's type, with late onset, with delusions ([place]) 09/05/2019  . Degenerative arthritis of left knee 09/05/2019  . S/P foot surgery, right 02/11/2018  . Essential hypertension 02/11/2018  . Hyperlipidemia 02/11/2018  . Acquired hallux valgus of  right foot   . Adhesive capsulitis of hip, right   . Metatarsal deformity, right   . Metatarsalgia of right foot   . Hammertoe of right foot   . Hyponatremia 02/21/2017  . Gait disorder 05/09/2016  . Cystocele 09/28/2015  . Diabetic polyneuropathy associated with type 2 diabetes mellitus ([place]) 09/28/2015  . Gastritis medicamentosa 09/28/2015  . History of breast lump/mass excision 09/28/2015  . Major depressive disorder with single episode, in partial remission ([place]) 09/28/2015  . Mixed incontinence  09/28/2015  . Polyneuropathy 09/28/2015  . Primary osteoarthritis involving multiple joints 09/28/2015  . Primary osteoarthritis of right hip 09/28/2015    Comprehensive medication review performed; Spoke to patient regarding cholesterol  Lipid Panel    Component Value Date/Time   CHOL 210 (H) 03/20/2020 1153   TRIG 66 03/20/2020 1153   HDL 74 03/20/2020 1153   LDLCALC 124 (H) 03/20/2020 1153    10-year ASCVD risk score: The ASCVD Risk score Mikey Bussing DC Jr., et al., 2013) failed to calculate for the following reasons:   The 2013 ASCVD risk score is only valid for ages 4 to 61   The patient has a prior MI or stroke diagnosis  . Current antihyperlipidemic regimen:   Pravastatin 40 mg tablet   Omega 3 1200 mg capsules . Previous antihyperlipidemic medications tried: None identified. . ASCVD risk enhancing conditions: age >4, DM, HTN, CKD, CHF, current smoker- None identified.  . What recent interventions/DTPs have been made by any provider to improve Cholesterol control since last CPP Visit: Patient is taking medications as prescribes and is following diet and, exercise instructions.  . Any recent hospitalizations or ED visits since last visit with CPP? No . What diet changes have been made to improve Cholesterol?  o Patient states that she is eating very well. . What exercise is being done to improve Cholesterol?  o Patient states that she is doing limited activity with caregiver twice a week with caregiver.   Adherence Review: Does the patient have >5 day gap between last estimated fill dates? No  Reviewed chart prior to disease state call. Spoke with patient regarding BP  Recent Office Vitals: BP Readings from Last 3 Encounters:  04/16/20 120/70  03/20/20 130/80  03/01/20 122/60   Pulse Readings from Last 3 Encounters:  03/20/20 85  03/01/20 93  02/16/20 86    Wt Readings from Last 3 Encounters:  04/16/20 162 lb (73.5 kg)  03/20/20 155 lb (70.3 kg)  03/01/20 162 lb  (73.5 kg)     Kidney Function Lab Results  Component Value Date/Time   CREATININE 0.70 03/20/2020 11:53 AM   CREATININE 0.63 02/07/2020 03:24 PM   GFRNONAA 81 03/20/2020 11:53 AM   GFRAA 93 03/20/2020 11:53 AM    BMP Latest Ref Rng & Units 03/20/2020 02/07/2020 12/12/2019  Glucose 65 - 99 mg/dL 99 93 97  BUN 8 - 27 mg/dL 22 16 14   Creatinine 0.57 - 1.00 mg/dL 0.70 0.63 0.61  BUN/Creat Ratio 12 - 28 31(H) 25 23  Sodium 134 - 144 mmol/L 137 138 131(L)  Potassium 3.5 - 5.2 mmol/L 4.3 4.4 4.2  Chloride 96 - 106 mmol/L 93(L) 95(L) 92(L)  CO2 20 - 29 mmol/L 29 27 26   Calcium 8.7 - 10.3 mg/dL 9.9 10.0 9.5    . Current antihypertensive regimen:         Valsartan-40 mg         Furosemide-40 mg         Amlodipine-  5mg  . How often are you checking your Blood Pressure? Patient states that she is not checking blood pressures at home. When caregiver comes twice a week to do excerises she will check blood pressure.  . Current home BP readings: Patient states that she is not sure and does not keep track.  . What recent interventions/DTPs have been made by any provider to improve Blood Pressure control since last CPP Visit: Patient states that she is taking medications as prescribes and is following diet and, exercise as instructed.  . Any recent hospitalizations or ED visits since last visit with CPP? No . What diet changes have been made to improve Blood Pressure Control?  o Patient states that she is not using salt at all in any foods. . What exercise is being done to improve your Blood Pressure Control? Patient states that she is doing limited activity with caregiver twice a week with caregiver.    Adherence Review: Is the patient currently on ACE/ARB medication? Yes, Valsartan 40 mg. Does the patient have >5 day gap between last estimated fill dates? No  Goals Addressed   None     Follow-Up:  Pharmacist Review   Sherre Poot, CPP. Notified.  [facility] Pharmacist  Assistant  [phone]

## 2020-05-14 ENCOUNTER — Encounter: Payer: Self-pay | Admitting: Legal Medicine

## 2020-05-14 ENCOUNTER — Ambulatory Visit (INDEPENDENT_AMBULATORY_CARE_PROVIDER_SITE_OTHER): Payer: PPO | Admitting: Legal Medicine

## 2020-05-14 ENCOUNTER — Other Ambulatory Visit: Payer: Self-pay

## 2020-05-14 VITALS — BP 118/70 | HR 62 | Temp 97.2°F

## 2020-05-14 DIAGNOSIS — L84 Corns and callosities: Secondary | ICD-10-CM

## 2020-05-14 DIAGNOSIS — M19071 Primary osteoarthritis, right ankle and foot: Secondary | ICD-10-CM | POA: Diagnosis not present

## 2020-05-14 NOTE — Progress Notes (Addendum)
Subjective:  Patient ID: Sandra Lynch, female    DOB: 20-Dec-1934  Age: 85 y.o. MRN: [id number]  Chief Complaint  Patient presents with  . Osteoarthritis    R Knee FU    HPI: patient is getting physical therapy.  And strength is better.  No change in walking.  She has foot pain in right foot and had multiple surgeries in it. Over 4 years ago and was in [facility] for rehab but never gained ambulatory status.  patient was admitted to SNF 4 years a go but decided to go home.  He has help but is is dependent for all her IADL. Current Outpatient Medications on File Prior to Visit  Medication Sig Dispense Refill  . albuterol (VENTOLIN HFA) 108 (90 Base) MCG/ACT inhaler Inhale 2 puffs into the lungs every 6 (six) hours as needed for wheezing or shortness of breath. 8 g 2  . alendronate (FOSAMAX) 70 MG tablet TAKE 1 TABLET BY MOUTH ONCE WEEKLY IN THE MORNING, AT LEAST 30 MIN BEFORE FIRST FOOD, BEVERAGE, OR MEDICATION OF DAY 12 tablet 2  . ALPRAZolam (XANAX) 0.25 MG tablet Take 1 tablet (0.25 mg total) by mouth 2 (two) times daily as needed for anxiety. 90 tablet 3  . amLODipine (NORVASC) 5 MG tablet Take 1 tablet (5 mg total) by mouth every evening. 30 tablet 3  . aspirin EC 81 MG tablet Take 1 tablet by mouth daily.    . Calcium Carbonate-Vitamin D (CALTRATE 600+D PO) Take 1 tablet by mouth daily.    . fluticasone (FLONASE) 50 MCG/ACT nasal spray SPRAY 1 SPRAY INTO EACH NOSTRIL EVERY DAY 16 mL 3  . furosemide (LASIX) 40 MG tablet TAKE 1 TABLET BY MOUTH EVERY DAY 90 tablet 2  . mirabegron ER (MYRBETRIQ) 25 MG TB24 tablet Take 1 tablet (25 mg total) by mouth daily. 90 tablet 2  . mirtazapine (REMERON) 30 MG tablet TAKE 1 TABLET BY MOUTH AT BEDTIME. 90 tablet 2  . Multiple Vitamins-Minerals (CENTRUM SILVER 50+WOMEN PO) Take 1 tablet by mouth daily.     . naproxen sodium (ALEVE) 220 MG tablet Take 220 mg by mouth at bedtime.    . Omega-3 Fatty Acids (FISH OIL) 1200 MG CAPS Take 1 tablet by mouth  daily.     Marland Kitchen omeprazole (PRILOSEC) 40 MG capsule TAKE 1 CAPSULE DAILY 90 capsule 2  . pramoxine-hydrocortisone (PROCTOCREAM-HC) 1-1 % rectal cream Place rectally 3 (three) times daily. 30 g 2  . pravastatin (PRAVACHOL) 40 MG tablet TAKE 1 TABLET BY MOUTH EVERY DAY 90 tablet 2  . traMADol (ULTRAM) 50 MG tablet Take 1 tablet (50 mg total) by mouth every 6 (six) hours as needed. 120 tablet 2  . valsartan (DIOVAN) 40 MG tablet Take 1 tablet (40 mg total) by mouth daily. for blood pressure 90 tablet 2   No current facility-administered medications on file prior to visit.   Past Medical History:  Diagnosis Date  . Anxiety   . Arthritis    "scattered in my joints" (02/11/2018)  . Essential hypertension   . Hyperlipidemia   . Hyponatremia    Past Surgical History:  Procedure Laterality Date  . APPENDECTOMY    . BUNIONECTOMY WITH HAMMERTOE RECONSTRUCTION Right 02/11/2018  . CARDIOVASCULAR STRESS TEST     01/25/18 [facility]): No reversible defects, fixed defect of mod-size in the septal wall mid ventricle to base, hypokinetic septal wall from mid ventricle to base, LVEF 50%, low risk  . HALLUX FUSION Right 02/11/2018  Procedure: HALLUX MPJ FUSION RIGHT FOOT;  Surgeon: [name], DPM;  Location: [facility];  Service: Podiatry;  Laterality: Right;  . HAMMER TOE SURGERY Right 02/11/2018   Procedure: RIGHT HAMMER TOE CORRECTION SECOND, THIRD, FOURTH AND FIFTH;  Surgeon: [name], DPM;  Location: [facility];  Service: Podiatry;  Laterality: Right;  . METATARSAL HEAD EXCISION Right 02/11/2018   Procedure: RIGHT PAN METATARSAL HEAD RESECTION;  Surgeon: [name], DPM;  Location: [place];  Service: Podiatry;  Laterality: Right;  . TOTAL ABDOMINAL HYSTERECTOMY    . TRANSTHORACIC ECHOCARDIOGRAM     01/24/18 [facility]): LV systolic function normal, EF 55-60%, impaired relaxation, LA mildly dilated, trace-mild MR, trace TR    Family History  Problem Relation Age of Onset  . Pneumonia  Mother   . Other Mother 51       Pneumonia  . Heart attack Father   . Hypertension Father   . Other Brother        Oncologist   Social History   Socioeconomic History  . Marital status: Widowed    Spouse name: Not on file  . Number of children: 2  . Years of education: Not on file  . Highest education level: Not on file  Occupational History  . Occupation: retired    Comment: Engineer, mining  Tobacco Use  . Smoking status: Never Smoker  . Smokeless tobacco: Never Used  Vaping Use  . Vaping Use: Never used  Substance and Sexual Activity  . Alcohol use: Not Currently    Comment: 02/11/2018 "couple driinks/month"  . Drug use: Never  . Sexual activity: Not Currently  Other Topics Concern  . Not on file  Social History Narrative  . Not on file   Social Determinants of Health   Financial Resource Strain:   . Difficulty of Paying Living Expenses: Not on file  Food Insecurity: No Food Insecurity  . Worried About Charity fundraiser in the Last Year: Never true  . Ran Out of Food in the Last Year: Never true  Transportation Needs: No Transportation Needs  . Lack of Transportation (Medical): No  . Lack of Transportation (Non-Medical): No  Physical Activity:   . Days of Exercise per Week: Not on file  . Minutes of Exercise per Session: Not on file  Stress:   . Feeling of Stress : Not on file  Social Connections:   . Frequency of Communication with Friends and Family: Not on file  . Frequency of Social Gatherings with Friends and Family: Not on file  . Attends Religious Services: Not on file  . Active Member of Clubs or Organizations: Not on file  . Attends Archivist Meetings: Not on file  . Marital Status: Not on file    Review of Systems  Constitutional: Negative.   HENT: Negative.   Eyes: Negative.   Respiratory: Negative.   Cardiovascular: Negative.   Genitourinary: Negative.   Musculoskeletal: Positive for arthralgias (right foot).  Skin:  Negative.   Neurological: Negative.   Psychiatric/Behavioral: Negative.      Objective:  BP 118/70 (BP Location: Left Arm, Patient Position: Sitting, Cuff Size: Normal)   Pulse 62   Temp (!) 97.2 F (36.2 C) (Temporal)   SpO2 93%   BP/Weight 05/14/2020 04/16/2020 9/56/3875  Systolic BP 643 329 518  Diastolic BP 70 70 80  Wt. (Lbs) - 162 155  BMI - 29.63 28.35    Physical Exam Constitutional:  Appearance: Normal appearance.     Comments: patient s wheel chair  Eyes:     Extraocular Movements: Extraocular movements intact.     Pupils: Pupils are equal, round, and reactive to light.  Cardiovascular:     Rate and Rhythm: Normal rate and regular rhythm.     Pulses: Normal pulses.     Heart sounds: Normal heart sounds.  Pulmonary:     Effort: Pulmonary effort is normal.     Breath sounds: Normal breath sounds.  Musculoskeletal:     Cervical back: Normal range of motion and neck supple.     Comments: Right foot is swollen and has deformity of midfoot.  She is unable to bear weight.  She ha [place] in hips, shoulders and elbows and has repeated steroid injections.  I have been trying to space these out. They are only temporary.  Skin:    Comments: Callus left foot- pared down so as not to hurt.  Neurological:     General: No focal deficit present.     Mental Status: She is alert and oriented to person, place, and time.       Lab Results  Component Value Date   WBC 8.5 03/20/2020   HGB 14.0 03/20/2020   HCT 41.8 03/20/2020   PLT 277 03/20/2020   GLUCOSE 99 03/20/2020   CHOL 210 (H) 03/20/2020   TRIG 66 03/20/2020   HDL 74 03/20/2020   LDLCALC 124 (H) 03/20/2020   ALT 23 03/20/2020   AST 18 03/20/2020   NA 137 03/20/2020   K 4.3 03/20/2020   CL 93 (L) 03/20/2020   CREATININE 0.70 03/20/2020   BUN 22 03/20/2020   CO2 29 03/20/2020   HGBA1C 6.2 (H) 03/20/2020      Assessment & Plan:   1. Arthrosis of foot, right - Ambulatory referral to  Podiatry Patient has deformity right foot and will refer to her past surgeons.  2. Callus left foot The callus was pared sharply , her caregivers can do this , I gave them scalpel I used.   Patient was seen today in her wheelchair with her caregiver.  We discussed her overall condition and with review patient is dependent for all her ADLs and IADLs.  She is not cooperating well with physical therapy and has not gained any strength from this.  We discussed getting an orthopedic consult to try to avoid recurrent steroid injections which she wants every time she comes in.  I tried to explain that it can cause further damage.  I declined to inject her elbow at this particular time and wanted to get further information about this.  Patient did have me tear her callus which I recommended a caregiver can do this.  Will refer to a podiatrist to help better foot care.  Patient is overall doing poorly as far as functionality and is continued to lose weight and become more dependent.  Patient was not happy that I would not inject her at the present time.  I tried to explain the reasoning.  Patient's last injection to her right knee was 04/16/2020 and she has had injections at least every 3 months to settle joint.  I am concerned about chronic damage to the joint as well as chronic steroid effects systemically.  We did discuss the possibility of skilled nursing which patient rejected.  Patient went out dissatisfied despite trying to discuss things with her.  We will follow up as scheduled with this point.   Orders Placed  This Encounter  Procedures  . Ambulatory referral to Podiatry     Follow-up: Return in about 3 months (around 08/14/2020) for fasting.  An After Visit Summary was printed and given to the patient.  [place] [phone]

## 2020-05-16 ENCOUNTER — Ambulatory Visit: Payer: PPO | Admitting: Legal Medicine

## 2020-05-21 ENCOUNTER — Telehealth: Payer: Self-pay

## 2020-05-21 NOTE — Telephone Encounter (Signed)
If she is not hurt, we shouldn't have to see lp

## 2020-05-21 NOTE — Telephone Encounter (Signed)
Dr. Henrene Pastor please advise.  [name] called stating that today at her visit the pt reported falling 10/18/2021just today. She fell getting up to get on the bedside commode. Pt is bruised from breast around trunk on right side. Pt denies SOB, pain or discomfort. Pt need appointment? Pt was seen in office 10/18 but not sure if fall happened before or after-tried calling pt left message to call back.

## 2020-05-21 NOTE — Telephone Encounter (Signed)
Pt was contacted and stated that she is doing ok other than being bruised really bad. She states she has an appointment to be checked up on an doesn't want to make an appointment at this time.

## 2020-05-23 DIAGNOSIS — M1611 Unilateral primary osteoarthritis, right hip: Secondary | ICD-10-CM | POA: Diagnosis not present

## 2020-05-23 DIAGNOSIS — D492 Neoplasm of unspecified behavior of bone, soft tissue, and skin: Secondary | ICD-10-CM | POA: Diagnosis not present

## 2020-05-23 DIAGNOSIS — I4821 Permanent atrial fibrillation: Secondary | ICD-10-CM | POA: Diagnosis not present

## 2020-05-23 DIAGNOSIS — M2041 Other hammer toe(s) (acquired), right foot: Secondary | ICD-10-CM | POA: Diagnosis not present

## 2020-05-23 DIAGNOSIS — R590 Localized enlarged lymph nodes: Secondary | ICD-10-CM | POA: Diagnosis not present

## 2020-05-23 DIAGNOSIS — E785 Hyperlipidemia, unspecified: Secondary | ICD-10-CM | POA: Diagnosis not present

## 2020-05-23 DIAGNOSIS — M7071 Other bursitis of hip, right hip: Secondary | ICD-10-CM | POA: Diagnosis not present

## 2020-05-23 DIAGNOSIS — R221 Localized swelling, mass and lump, neck: Secondary | ICD-10-CM | POA: Diagnosis not present

## 2020-05-23 DIAGNOSIS — I6523 Occlusion and stenosis of bilateral carotid arteries: Secondary | ICD-10-CM | POA: Diagnosis not present

## 2020-05-23 DIAGNOSIS — E038 Other specified hypothyroidism: Secondary | ICD-10-CM | POA: Diagnosis not present

## 2020-05-23 DIAGNOSIS — Z23 Encounter for immunization: Secondary | ICD-10-CM | POA: Diagnosis not present

## 2020-05-23 DIAGNOSIS — I10 Essential (primary) hypertension: Secondary | ICD-10-CM | POA: Diagnosis not present

## 2020-05-23 DIAGNOSIS — F419 Anxiety disorder, unspecified: Secondary | ICD-10-CM | POA: Diagnosis not present

## 2020-05-23 DIAGNOSIS — M8949 Other hypertrophic osteoarthropathy, multiple sites: Secondary | ICD-10-CM | POA: Diagnosis not present

## 2020-05-23 DIAGNOSIS — E1142 Type 2 diabetes mellitus with diabetic polyneuropathy: Secondary | ICD-10-CM | POA: Diagnosis not present

## 2020-05-23 DIAGNOSIS — M810 Age-related osteoporosis without current pathological fracture: Secondary | ICD-10-CM | POA: Diagnosis not present

## 2020-05-23 DIAGNOSIS — Z9181 History of falling: Secondary | ICD-10-CM | POA: Diagnosis not present

## 2020-05-23 DIAGNOSIS — F0280 Dementia in other diseases classified elsewhere without behavioral disturbance: Secondary | ICD-10-CM | POA: Diagnosis not present

## 2020-05-23 DIAGNOSIS — M2011 Hallux valgus (acquired), right foot: Secondary | ICD-10-CM | POA: Diagnosis not present

## 2020-05-23 DIAGNOSIS — Z7982 Long term (current) use of aspirin: Secondary | ICD-10-CM | POA: Diagnosis not present

## 2020-05-23 DIAGNOSIS — G301 Alzheimer's disease with late onset: Secondary | ICD-10-CM | POA: Diagnosis not present

## 2020-05-23 DIAGNOSIS — Z79899 Other long term (current) drug therapy: Secondary | ICD-10-CM | POA: Diagnosis not present

## 2020-05-23 DIAGNOSIS — M216X1 Other acquired deformities of right foot: Secondary | ICD-10-CM | POA: Diagnosis not present

## 2020-05-23 DIAGNOSIS — M170 Bilateral primary osteoarthritis of knee: Secondary | ICD-10-CM | POA: Diagnosis not present

## 2020-05-23 DIAGNOSIS — M7501 Adhesive capsulitis of right shoulder: Secondary | ICD-10-CM | POA: Diagnosis not present

## 2020-05-23 DIAGNOSIS — R229 Localized swelling, mass and lump, unspecified: Secondary | ICD-10-CM | POA: Diagnosis not present

## 2020-05-23 DIAGNOSIS — R011 Cardiac murmur, unspecified: Secondary | ICD-10-CM | POA: Diagnosis not present

## 2020-05-23 DIAGNOSIS — N3946 Mixed incontinence: Secondary | ICD-10-CM | POA: Diagnosis not present

## 2020-05-23 DIAGNOSIS — Z791 Long term (current) use of non-steroidal anti-inflammatories (NSAID): Secondary | ICD-10-CM | POA: Diagnosis not present

## 2020-05-23 DIAGNOSIS — F324 Major depressive disorder, single episode, in partial remission: Secondary | ICD-10-CM | POA: Diagnosis not present

## 2020-05-23 DIAGNOSIS — R0989 Other specified symptoms and signs involving the circulatory and respiratory systems: Secondary | ICD-10-CM | POA: Diagnosis not present

## 2020-05-23 DIAGNOSIS — E782 Mixed hyperlipidemia: Secondary | ICD-10-CM | POA: Diagnosis not present

## 2020-05-23 DIAGNOSIS — Z7951 Long term (current) use of inhaled steroids: Secondary | ICD-10-CM | POA: Diagnosis not present

## 2020-05-24 DIAGNOSIS — R0989 Other specified symptoms and signs involving the circulatory and respiratory systems: Secondary | ICD-10-CM | POA: Diagnosis not present

## 2020-05-24 DIAGNOSIS — D492 Neoplasm of unspecified behavior of bone, soft tissue, and skin: Secondary | ICD-10-CM | POA: Diagnosis not present

## 2020-05-24 DIAGNOSIS — I6523 Occlusion and stenosis of bilateral carotid arteries: Secondary | ICD-10-CM | POA: Diagnosis not present

## 2020-05-24 DIAGNOSIS — R590 Localized enlarged lymph nodes: Secondary | ICD-10-CM | POA: Diagnosis not present

## 2020-05-24 DIAGNOSIS — R229 Localized swelling, mass and lump, unspecified: Secondary | ICD-10-CM | POA: Diagnosis not present

## 2020-05-28 DIAGNOSIS — I700 Atherosclerosis of aorta: Secondary | ICD-10-CM | POA: Diagnosis not present

## 2020-05-28 DIAGNOSIS — E042 Nontoxic multinodular goiter: Secondary | ICD-10-CM | POA: Diagnosis not present

## 2020-05-28 DIAGNOSIS — I714 Abdominal aortic aneurysm, without rupture: Secondary | ICD-10-CM | POA: Diagnosis not present

## 2020-05-28 DIAGNOSIS — R221 Localized swelling, mass and lump, neck: Secondary | ICD-10-CM | POA: Diagnosis not present

## 2020-05-31 ENCOUNTER — Ambulatory Visit (INDEPENDENT_AMBULATORY_CARE_PROVIDER_SITE_OTHER): Payer: PPO | Admitting: Podiatry

## 2020-05-31 DIAGNOSIS — Z5329 Procedure and treatment not carried out because of patient's decision for other reasons: Secondary | ICD-10-CM

## 2020-05-31 NOTE — Progress Notes (Signed)
No show for appt. 

## 2020-06-01 ENCOUNTER — Ambulatory Visit: Payer: PPO

## 2020-06-13 ENCOUNTER — Other Ambulatory Visit: Payer: Self-pay | Admitting: Legal Medicine

## 2020-06-13 DIAGNOSIS — F0281 Dementia in other diseases classified elsewhere with behavioral disturbance: Secondary | ICD-10-CM

## 2020-06-13 DIAGNOSIS — F02818 Dementia in other diseases classified elsewhere, unspecified severity, with other behavioral disturbance: Secondary | ICD-10-CM

## 2020-06-14 ENCOUNTER — Other Ambulatory Visit: Payer: Self-pay | Admitting: Legal Medicine

## 2020-06-15 DIAGNOSIS — E782 Mixed hyperlipidemia: Secondary | ICD-10-CM | POA: Diagnosis not present

## 2020-06-15 DIAGNOSIS — E038 Other specified hypothyroidism: Secondary | ICD-10-CM | POA: Diagnosis not present

## 2020-06-15 DIAGNOSIS — I10 Essential (primary) hypertension: Secondary | ICD-10-CM | POA: Diagnosis not present

## 2020-06-23 DIAGNOSIS — M150 Primary generalized (osteo)arthritis: Secondary | ICD-10-CM | POA: Diagnosis not present

## 2020-06-23 DIAGNOSIS — I10 Essential (primary) hypertension: Secondary | ICD-10-CM | POA: Diagnosis not present

## 2020-06-23 DIAGNOSIS — J449 Chronic obstructive pulmonary disease, unspecified: Secondary | ICD-10-CM | POA: Diagnosis not present

## 2020-06-23 DIAGNOSIS — E782 Mixed hyperlipidemia: Secondary | ICD-10-CM | POA: Diagnosis not present

## 2020-06-23 DIAGNOSIS — E038 Other specified hypothyroidism: Secondary | ICD-10-CM | POA: Diagnosis not present

## 2020-06-25 DIAGNOSIS — G4700 Insomnia, unspecified: Secondary | ICD-10-CM | POA: Diagnosis not present

## 2020-06-25 DIAGNOSIS — M25571 Pain in right ankle and joints of right foot: Secondary | ICD-10-CM | POA: Diagnosis not present

## 2020-06-25 DIAGNOSIS — F419 Anxiety disorder, unspecified: Secondary | ICD-10-CM | POA: Diagnosis not present

## 2020-06-25 DIAGNOSIS — K219 Gastro-esophageal reflux disease without esophagitis: Secondary | ICD-10-CM | POA: Diagnosis not present

## 2020-06-25 DIAGNOSIS — E782 Mixed hyperlipidemia: Secondary | ICD-10-CM | POA: Diagnosis not present

## 2020-06-25 DIAGNOSIS — J449 Chronic obstructive pulmonary disease, unspecified: Secondary | ICD-10-CM | POA: Diagnosis not present

## 2020-06-25 DIAGNOSIS — Z79899 Other long term (current) drug therapy: Secondary | ICD-10-CM | POA: Diagnosis not present

## 2020-06-25 DIAGNOSIS — M150 Primary generalized (osteo)arthritis: Secondary | ICD-10-CM | POA: Diagnosis not present

## 2020-06-25 DIAGNOSIS — R0789 Other chest pain: Secondary | ICD-10-CM | POA: Diagnosis not present

## 2020-06-25 DIAGNOSIS — I10 Essential (primary) hypertension: Secondary | ICD-10-CM | POA: Diagnosis not present

## 2020-06-25 DIAGNOSIS — M79671 Pain in right foot: Secondary | ICD-10-CM | POA: Diagnosis not present

## 2020-06-25 DIAGNOSIS — M8589 Other specified disorders of bone density and structure, multiple sites: Secondary | ICD-10-CM | POA: Diagnosis not present

## 2020-07-04 DIAGNOSIS — I10 Essential (primary) hypertension: Secondary | ICD-10-CM | POA: Diagnosis not present

## 2020-07-04 DIAGNOSIS — E559 Vitamin D deficiency, unspecified: Secondary | ICD-10-CM | POA: Diagnosis not present

## 2020-07-04 DIAGNOSIS — J449 Chronic obstructive pulmonary disease, unspecified: Secondary | ICD-10-CM | POA: Diagnosis not present

## 2020-07-04 DIAGNOSIS — M150 Primary generalized (osteo)arthritis: Secondary | ICD-10-CM | POA: Diagnosis not present

## 2020-07-04 DIAGNOSIS — M1711 Unilateral primary osteoarthritis, right knee: Secondary | ICD-10-CM | POA: Diagnosis not present

## 2020-07-04 DIAGNOSIS — E038 Other specified hypothyroidism: Secondary | ICD-10-CM | POA: Diagnosis not present

## 2020-07-04 DIAGNOSIS — E782 Mixed hyperlipidemia: Secondary | ICD-10-CM | POA: Diagnosis not present

## 2020-07-06 DIAGNOSIS — M150 Primary generalized (osteo)arthritis: Secondary | ICD-10-CM | POA: Diagnosis not present

## 2020-07-06 DIAGNOSIS — M8589 Other specified disorders of bone density and structure, multiple sites: Secondary | ICD-10-CM | POA: Diagnosis not present

## 2020-07-06 DIAGNOSIS — F419 Anxiety disorder, unspecified: Secondary | ICD-10-CM | POA: Diagnosis not present

## 2020-07-06 DIAGNOSIS — G4700 Insomnia, unspecified: Secondary | ICD-10-CM | POA: Diagnosis not present

## 2020-07-06 DIAGNOSIS — M1711 Unilateral primary osteoarthritis, right knee: Secondary | ICD-10-CM | POA: Diagnosis not present

## 2020-07-06 DIAGNOSIS — E559 Vitamin D deficiency, unspecified: Secondary | ICD-10-CM | POA: Diagnosis not present

## 2020-07-06 DIAGNOSIS — J449 Chronic obstructive pulmonary disease, unspecified: Secondary | ICD-10-CM | POA: Diagnosis not present

## 2020-07-06 DIAGNOSIS — R609 Edema, unspecified: Secondary | ICD-10-CM | POA: Diagnosis not present

## 2020-07-06 DIAGNOSIS — I10 Essential (primary) hypertension: Secondary | ICD-10-CM | POA: Diagnosis not present

## 2020-07-06 DIAGNOSIS — E782 Mixed hyperlipidemia: Secondary | ICD-10-CM | POA: Diagnosis not present

## 2020-07-06 DIAGNOSIS — K219 Gastro-esophageal reflux disease without esophagitis: Secondary | ICD-10-CM | POA: Diagnosis not present

## 2020-07-09 DIAGNOSIS — K219 Gastro-esophageal reflux disease without esophagitis: Secondary | ICD-10-CM | POA: Diagnosis not present

## 2020-07-09 DIAGNOSIS — G4700 Insomnia, unspecified: Secondary | ICD-10-CM | POA: Diagnosis not present

## 2020-07-09 DIAGNOSIS — Z9049 Acquired absence of other specified parts of digestive tract: Secondary | ICD-10-CM | POA: Diagnosis not present

## 2020-07-09 DIAGNOSIS — I700 Atherosclerosis of aorta: Secondary | ICD-10-CM | POA: Diagnosis not present

## 2020-07-09 DIAGNOSIS — E042 Nontoxic multinodular goiter: Secondary | ICD-10-CM | POA: Diagnosis not present

## 2020-07-09 DIAGNOSIS — E782 Mixed hyperlipidemia: Secondary | ICD-10-CM | POA: Diagnosis not present

## 2020-07-09 DIAGNOSIS — J449 Chronic obstructive pulmonary disease, unspecified: Secondary | ICD-10-CM | POA: Diagnosis not present

## 2020-07-09 DIAGNOSIS — I6523 Occlusion and stenosis of bilateral carotid arteries: Secondary | ICD-10-CM | POA: Diagnosis not present

## 2020-07-09 DIAGNOSIS — F419 Anxiety disorder, unspecified: Secondary | ICD-10-CM | POA: Diagnosis not present

## 2020-07-09 DIAGNOSIS — M150 Primary generalized (osteo)arthritis: Secondary | ICD-10-CM | POA: Diagnosis not present

## 2020-07-09 DIAGNOSIS — E038 Other specified hypothyroidism: Secondary | ICD-10-CM | POA: Diagnosis not present

## 2020-07-09 DIAGNOSIS — M8589 Other specified disorders of bone density and structure, multiple sites: Secondary | ICD-10-CM | POA: Diagnosis not present

## 2020-07-09 DIAGNOSIS — J309 Allergic rhinitis, unspecified: Secondary | ICD-10-CM | POA: Diagnosis not present

## 2020-07-09 DIAGNOSIS — I10 Essential (primary) hypertension: Secondary | ICD-10-CM | POA: Diagnosis not present

## 2020-07-24 ENCOUNTER — Ambulatory Visit: Payer: PPO | Admitting: Legal Medicine

## 2020-07-24 ENCOUNTER — Other Ambulatory Visit: Payer: Self-pay | Admitting: Legal Medicine

## 2020-08-01 DIAGNOSIS — I10 Essential (primary) hypertension: Secondary | ICD-10-CM | POA: Diagnosis not present

## 2020-08-01 DIAGNOSIS — L989 Disorder of the skin and subcutaneous tissue, unspecified: Secondary | ICD-10-CM | POA: Diagnosis not present

## 2020-08-01 DIAGNOSIS — S2001XD Contusion of right breast, subsequent encounter: Secondary | ICD-10-CM | POA: Diagnosis not present

## 2020-08-03 DIAGNOSIS — E038 Other specified hypothyroidism: Secondary | ICD-10-CM | POA: Diagnosis not present

## 2020-08-03 DIAGNOSIS — J449 Chronic obstructive pulmonary disease, unspecified: Secondary | ICD-10-CM | POA: Diagnosis not present

## 2020-08-03 DIAGNOSIS — M150 Primary generalized (osteo)arthritis: Secondary | ICD-10-CM | POA: Diagnosis not present

## 2020-08-03 DIAGNOSIS — E782 Mixed hyperlipidemia: Secondary | ICD-10-CM | POA: Diagnosis not present

## 2020-08-03 DIAGNOSIS — I10 Essential (primary) hypertension: Secondary | ICD-10-CM | POA: Diagnosis not present

## 2020-08-03 DIAGNOSIS — M1711 Unilateral primary osteoarthritis, right knee: Secondary | ICD-10-CM | POA: Diagnosis not present

## 2020-08-03 DIAGNOSIS — E559 Vitamin D deficiency, unspecified: Secondary | ICD-10-CM | POA: Diagnosis not present

## 2020-08-06 DIAGNOSIS — R0981 Nasal congestion: Secondary | ICD-10-CM | POA: Diagnosis not present

## 2020-08-06 DIAGNOSIS — J189 Pneumonia, unspecified organism: Secondary | ICD-10-CM | POA: Diagnosis not present

## 2020-08-06 DIAGNOSIS — R051 Acute cough: Secondary | ICD-10-CM | POA: Diagnosis not present

## 2020-08-06 DIAGNOSIS — Z20828 Contact with and (suspected) exposure to other viral communicable diseases: Secondary | ICD-10-CM | POA: Diagnosis not present

## 2020-08-06 DIAGNOSIS — R070 Pain in throat: Secondary | ICD-10-CM | POA: Diagnosis not present

## 2020-08-07 ENCOUNTER — Telehealth: Payer: Self-pay | Admitting: Nurse Practitioner

## 2020-08-07 NOTE — Telephone Encounter (Signed)
Called patient to discuss Covid symptoms and the use of sotrovimab, a monoclonal antibody infusion for those with mild to moderate Covid symptoms and at a high risk of hospitalization.  Pt is qualified for this infusion at the [facility] due to; Specific high risk criteria : Older age (>/= 84 yo) and Cardiovascular disease or hypertension   Message left to call back our hotline [phone].  Murray Hodgkins, NP

## 2020-08-11 ENCOUNTER — Other Ambulatory Visit: Payer: Self-pay | Admitting: Legal Medicine

## 2020-08-11 DIAGNOSIS — K219 Gastro-esophageal reflux disease without esophagitis: Secondary | ICD-10-CM

## 2020-08-22 DIAGNOSIS — R928 Other abnormal and inconclusive findings on diagnostic imaging of breast: Secondary | ICD-10-CM | POA: Diagnosis not present

## 2020-08-22 DIAGNOSIS — N6489 Other specified disorders of breast: Secondary | ICD-10-CM | POA: Diagnosis not present

## 2020-09-07 ENCOUNTER — Other Ambulatory Visit: Payer: Self-pay | Admitting: Legal Medicine

## 2020-09-07 DIAGNOSIS — F02818 Dementia in other diseases classified elsewhere, unspecified severity, with other behavioral disturbance: Secondary | ICD-10-CM

## 2020-09-07 DIAGNOSIS — F0281 Dementia in other diseases classified elsewhere with behavioral disturbance: Secondary | ICD-10-CM

## 2020-09-10 ENCOUNTER — Other Ambulatory Visit: Payer: Self-pay | Admitting: Legal Medicine

## 2020-09-17 ENCOUNTER — Other Ambulatory Visit: Payer: Self-pay | Admitting: Legal Medicine

## 2020-10-01 DIAGNOSIS — J302 Other seasonal allergic rhinitis: Secondary | ICD-10-CM | POA: Diagnosis not present

## 2020-10-01 DIAGNOSIS — J209 Acute bronchitis, unspecified: Secondary | ICD-10-CM | POA: Diagnosis not present

## 2020-10-08 DIAGNOSIS — J449 Chronic obstructive pulmonary disease, unspecified: Secondary | ICD-10-CM | POA: Diagnosis not present

## 2020-10-08 DIAGNOSIS — E782 Mixed hyperlipidemia: Secondary | ICD-10-CM | POA: Diagnosis not present

## 2020-10-08 DIAGNOSIS — E038 Other specified hypothyroidism: Secondary | ICD-10-CM | POA: Diagnosis not present

## 2020-10-08 DIAGNOSIS — I10 Essential (primary) hypertension: Secondary | ICD-10-CM | POA: Diagnosis not present

## 2020-10-08 DIAGNOSIS — M1711 Unilateral primary osteoarthritis, right knee: Secondary | ICD-10-CM | POA: Diagnosis not present

## 2020-10-08 DIAGNOSIS — M150 Primary generalized (osteo)arthritis: Secondary | ICD-10-CM | POA: Diagnosis not present

## 2020-10-08 DIAGNOSIS — E559 Vitamin D deficiency, unspecified: Secondary | ICD-10-CM | POA: Diagnosis not present

## 2020-10-12 DIAGNOSIS — N3946 Mixed incontinence: Secondary | ICD-10-CM | POA: Diagnosis not present

## 2020-10-12 DIAGNOSIS — K219 Gastro-esophageal reflux disease without esophagitis: Secondary | ICD-10-CM | POA: Diagnosis not present

## 2020-10-12 DIAGNOSIS — E782 Mixed hyperlipidemia: Secondary | ICD-10-CM | POA: Diagnosis not present

## 2020-10-12 DIAGNOSIS — R7303 Prediabetes: Secondary | ICD-10-CM | POA: Diagnosis not present

## 2020-10-12 DIAGNOSIS — F324 Major depressive disorder, single episode, in partial remission: Secondary | ICD-10-CM | POA: Diagnosis not present

## 2020-10-12 DIAGNOSIS — I10 Essential (primary) hypertension: Secondary | ICD-10-CM | POA: Diagnosis not present

## 2020-10-12 DIAGNOSIS — J302 Other seasonal allergic rhinitis: Secondary | ICD-10-CM | POA: Diagnosis not present

## 2020-10-12 DIAGNOSIS — R7301 Impaired fasting glucose: Secondary | ICD-10-CM | POA: Diagnosis not present

## 2020-10-12 DIAGNOSIS — R2681 Unsteadiness on feet: Secondary | ICD-10-CM | POA: Diagnosis not present

## 2020-10-12 DIAGNOSIS — R053 Chronic cough: Secondary | ICD-10-CM | POA: Diagnosis not present

## 2020-10-12 DIAGNOSIS — M8949 Other hypertrophic osteoarthropathy, multiple sites: Secondary | ICD-10-CM | POA: Diagnosis not present

## 2020-10-12 DIAGNOSIS — F419 Anxiety disorder, unspecified: Secondary | ICD-10-CM | POA: Diagnosis not present

## 2020-10-15 DIAGNOSIS — R059 Cough, unspecified: Secondary | ICD-10-CM | POA: Diagnosis not present

## 2020-10-15 DIAGNOSIS — R053 Chronic cough: Secondary | ICD-10-CM | POA: Diagnosis not present

## 2020-10-15 DIAGNOSIS — I517 Cardiomegaly: Secondary | ICD-10-CM | POA: Diagnosis not present

## 2020-10-20 ENCOUNTER — Other Ambulatory Visit: Payer: Self-pay | Admitting: Legal Medicine

## 2020-10-21 ENCOUNTER — Other Ambulatory Visit: Payer: Self-pay | Admitting: Legal Medicine

## 2020-10-21 DIAGNOSIS — G301 Alzheimer's disease with late onset: Secondary | ICD-10-CM

## 2020-10-21 DIAGNOSIS — F0281 Dementia in other diseases classified elsewhere with behavioral disturbance: Secondary | ICD-10-CM

## 2020-11-02 ENCOUNTER — Other Ambulatory Visit: Payer: Self-pay | Admitting: Legal Medicine

## 2020-11-02 DIAGNOSIS — R600 Localized edema: Secondary | ICD-10-CM

## 2020-11-13 DIAGNOSIS — M85861 Other specified disorders of bone density and structure, right lower leg: Secondary | ICD-10-CM | POA: Diagnosis not present

## 2020-11-13 DIAGNOSIS — I739 Peripheral vascular disease, unspecified: Secondary | ICD-10-CM | POA: Diagnosis not present

## 2020-11-13 DIAGNOSIS — G8929 Other chronic pain: Secondary | ICD-10-CM | POA: Diagnosis not present

## 2020-11-13 DIAGNOSIS — M25561 Pain in right knee: Secondary | ICD-10-CM | POA: Diagnosis not present

## 2020-11-13 DIAGNOSIS — M1711 Unilateral primary osteoarthritis, right knee: Secondary | ICD-10-CM | POA: Diagnosis not present

## 2020-11-20 DIAGNOSIS — M25511 Pain in right shoulder: Secondary | ICD-10-CM | POA: Diagnosis not present

## 2020-11-20 DIAGNOSIS — M25561 Pain in right knee: Secondary | ICD-10-CM | POA: Diagnosis not present

## 2020-11-20 DIAGNOSIS — G8929 Other chronic pain: Secondary | ICD-10-CM | POA: Diagnosis not present

## 2020-11-20 DIAGNOSIS — M19011 Primary osteoarthritis, right shoulder: Secondary | ICD-10-CM | POA: Diagnosis not present

## 2020-11-23 DIAGNOSIS — M159 Polyosteoarthritis, unspecified: Secondary | ICD-10-CM | POA: Diagnosis not present

## 2020-12-06 ENCOUNTER — Other Ambulatory Visit: Payer: Self-pay | Admitting: Legal Medicine

## 2020-12-06 DIAGNOSIS — I10 Essential (primary) hypertension: Secondary | ICD-10-CM

## 2020-12-14 DIAGNOSIS — M159 Polyosteoarthritis, unspecified: Secondary | ICD-10-CM | POA: Diagnosis not present

## 2021-01-03 DIAGNOSIS — M1711 Unilateral primary osteoarthritis, right knee: Secondary | ICD-10-CM | POA: Diagnosis not present

## 2021-01-13 ENCOUNTER — Other Ambulatory Visit: Payer: Self-pay | Admitting: Legal Medicine

## 2021-01-13 DIAGNOSIS — F0281 Dementia in other diseases classified elsewhere with behavioral disturbance: Secondary | ICD-10-CM

## 2021-01-16 DIAGNOSIS — R7303 Prediabetes: Secondary | ICD-10-CM | POA: Diagnosis not present

## 2021-01-16 DIAGNOSIS — M159 Polyosteoarthritis, unspecified: Secondary | ICD-10-CM | POA: Diagnosis not present

## 2021-01-16 DIAGNOSIS — R6889 Other general symptoms and signs: Secondary | ICD-10-CM | POA: Diagnosis not present

## 2021-01-22 DIAGNOSIS — N390 Urinary tract infection, site not specified: Secondary | ICD-10-CM | POA: Diagnosis not present

## 2021-02-05 DIAGNOSIS — K219 Gastro-esophageal reflux disease without esophagitis: Secondary | ICD-10-CM | POA: Diagnosis not present

## 2021-02-05 DIAGNOSIS — E782 Mixed hyperlipidemia: Secondary | ICD-10-CM | POA: Diagnosis not present

## 2021-02-05 DIAGNOSIS — M159 Polyosteoarthritis, unspecified: Secondary | ICD-10-CM | POA: Diagnosis not present

## 2021-02-05 DIAGNOSIS — I10 Essential (primary) hypertension: Secondary | ICD-10-CM | POA: Diagnosis not present

## 2021-02-05 DIAGNOSIS — R7303 Prediabetes: Secondary | ICD-10-CM | POA: Diagnosis not present

## 2021-02-05 DIAGNOSIS — E559 Vitamin D deficiency, unspecified: Secondary | ICD-10-CM | POA: Diagnosis not present

## 2021-02-05 DIAGNOSIS — G8929 Other chronic pain: Secondary | ICD-10-CM | POA: Diagnosis not present

## 2021-02-05 DIAGNOSIS — F324 Major depressive disorder, single episode, in partial remission: Secondary | ICD-10-CM | POA: Diagnosis not present

## 2021-02-05 DIAGNOSIS — N3946 Mixed incontinence: Secondary | ICD-10-CM | POA: Diagnosis not present

## 2021-02-05 DIAGNOSIS — J449 Chronic obstructive pulmonary disease, unspecified: Secondary | ICD-10-CM | POA: Diagnosis not present

## 2021-02-05 DIAGNOSIS — I6523 Occlusion and stenosis of bilateral carotid arteries: Secondary | ICD-10-CM | POA: Diagnosis not present

## 2021-02-05 DIAGNOSIS — G629 Polyneuropathy, unspecified: Secondary | ICD-10-CM | POA: Diagnosis not present

## 2021-02-06 DIAGNOSIS — M7989 Other specified soft tissue disorders: Secondary | ICD-10-CM | POA: Diagnosis not present

## 2021-03-02 DIAGNOSIS — J449 Chronic obstructive pulmonary disease, unspecified: Secondary | ICD-10-CM | POA: Diagnosis not present

## 2021-03-02 DIAGNOSIS — M159 Polyosteoarthritis, unspecified: Secondary | ICD-10-CM | POA: Diagnosis not present

## 2021-03-02 DIAGNOSIS — K219 Gastro-esophageal reflux disease without esophagitis: Secondary | ICD-10-CM | POA: Diagnosis not present

## 2021-03-02 DIAGNOSIS — I10 Essential (primary) hypertension: Secondary | ICD-10-CM | POA: Diagnosis not present

## 2021-03-02 DIAGNOSIS — G629 Polyneuropathy, unspecified: Secondary | ICD-10-CM | POA: Diagnosis not present

## 2021-03-02 DIAGNOSIS — G8929 Other chronic pain: Secondary | ICD-10-CM | POA: Diagnosis not present

## 2021-03-02 DIAGNOSIS — E559 Vitamin D deficiency, unspecified: Secondary | ICD-10-CM | POA: Diagnosis not present

## 2021-03-02 DIAGNOSIS — R7303 Prediabetes: Secondary | ICD-10-CM | POA: Diagnosis not present

## 2021-03-02 DIAGNOSIS — N3946 Mixed incontinence: Secondary | ICD-10-CM | POA: Diagnosis not present

## 2021-03-02 DIAGNOSIS — F324 Major depressive disorder, single episode, in partial remission: Secondary | ICD-10-CM | POA: Diagnosis not present

## 2021-03-02 DIAGNOSIS — I6523 Occlusion and stenosis of bilateral carotid arteries: Secondary | ICD-10-CM | POA: Diagnosis not present

## 2021-03-02 DIAGNOSIS — E782 Mixed hyperlipidemia: Secondary | ICD-10-CM | POA: Diagnosis not present

## 2021-03-04 DIAGNOSIS — G301 Alzheimer's disease with late onset: Secondary | ICD-10-CM | POA: Diagnosis not present

## 2021-03-04 DIAGNOSIS — L89152 Pressure ulcer of sacral region, stage 2: Secondary | ICD-10-CM | POA: Diagnosis not present

## 2021-03-04 DIAGNOSIS — F0281 Dementia in other diseases classified elsewhere with behavioral disturbance: Secondary | ICD-10-CM | POA: Diagnosis not present

## 2021-03-04 DIAGNOSIS — F22 Delusional disorders: Secondary | ICD-10-CM | POA: Diagnosis not present

## 2021-03-04 DIAGNOSIS — F419 Anxiety disorder, unspecified: Secondary | ICD-10-CM | POA: Diagnosis not present

## 2021-04-11 DIAGNOSIS — M1711 Unilateral primary osteoarthritis, right knee: Secondary | ICD-10-CM | POA: Diagnosis not present

## 2021-04-19 DIAGNOSIS — E782 Mixed hyperlipidemia: Secondary | ICD-10-CM | POA: Diagnosis not present

## 2021-04-19 DIAGNOSIS — M159 Polyosteoarthritis, unspecified: Secondary | ICD-10-CM | POA: Diagnosis not present

## 2021-04-19 DIAGNOSIS — F419 Anxiety disorder, unspecified: Secondary | ICD-10-CM | POA: Diagnosis not present

## 2021-04-19 DIAGNOSIS — F0281 Dementia in other diseases classified elsewhere with behavioral disturbance: Secondary | ICD-10-CM | POA: Diagnosis not present

## 2021-04-19 DIAGNOSIS — R7303 Prediabetes: Secondary | ICD-10-CM | POA: Diagnosis not present

## 2021-04-19 DIAGNOSIS — L89150 Pressure ulcer of sacral region, unstageable: Secondary | ICD-10-CM | POA: Diagnosis not present

## 2021-04-19 DIAGNOSIS — F22 Delusional disorders: Secondary | ICD-10-CM | POA: Diagnosis not present

## 2021-04-19 DIAGNOSIS — G301 Alzheimer's disease with late onset: Secondary | ICD-10-CM | POA: Diagnosis not present

## 2021-04-19 DIAGNOSIS — N3946 Mixed incontinence: Secondary | ICD-10-CM | POA: Diagnosis not present

## 2021-04-19 DIAGNOSIS — I10 Essential (primary) hypertension: Secondary | ICD-10-CM | POA: Diagnosis not present

## 2021-05-04 ENCOUNTER — Other Ambulatory Visit: Payer: Self-pay | Admitting: Legal Medicine

## 2021-05-04 DIAGNOSIS — K219 Gastro-esophageal reflux disease without esophagitis: Secondary | ICD-10-CM

## 2021-05-08 DIAGNOSIS — R531 Weakness: Secondary | ICD-10-CM | POA: Diagnosis not present

## 2021-05-08 DIAGNOSIS — N390 Urinary tract infection, site not specified: Secondary | ICD-10-CM | POA: Diagnosis not present

## 2021-05-08 DIAGNOSIS — R319 Hematuria, unspecified: Secondary | ICD-10-CM | POA: Diagnosis not present

## 2021-05-08 DIAGNOSIS — R0601 Orthopnea: Secondary | ICD-10-CM | POA: Diagnosis not present

## 2021-05-08 DIAGNOSIS — R0602 Shortness of breath: Secondary | ICD-10-CM | POA: Diagnosis not present

## 2021-05-08 DIAGNOSIS — R5383 Other fatigue: Secondary | ICD-10-CM | POA: Diagnosis not present

## 2021-05-08 DIAGNOSIS — R5381 Other malaise: Secondary | ICD-10-CM | POA: Diagnosis not present

## 2021-05-17 DIAGNOSIS — R319 Hematuria, unspecified: Secondary | ICD-10-CM | POA: Diagnosis not present

## 2021-05-17 DIAGNOSIS — N390 Urinary tract infection, site not specified: Secondary | ICD-10-CM | POA: Diagnosis not present

## 2021-05-19 ENCOUNTER — Other Ambulatory Visit: Payer: Self-pay | Admitting: Legal Medicine

## 2021-05-22 DIAGNOSIS — F02818 Dementia in other diseases classified elsewhere, unspecified severity, with other behavioral disturbance: Secondary | ICD-10-CM | POA: Diagnosis not present

## 2021-05-22 DIAGNOSIS — L89150 Pressure ulcer of sacral region, unstageable: Secondary | ICD-10-CM | POA: Diagnosis not present

## 2021-05-22 DIAGNOSIS — Z23 Encounter for immunization: Secondary | ICD-10-CM | POA: Diagnosis not present

## 2021-05-22 DIAGNOSIS — G301 Alzheimer's disease with late onset: Secondary | ICD-10-CM | POA: Diagnosis not present

## 2021-05-29 DIAGNOSIS — L89150 Pressure ulcer of sacral region, unstageable: Secondary | ICD-10-CM | POA: Diagnosis not present

## 2021-05-29 DIAGNOSIS — F0280 Dementia in other diseases classified elsewhere without behavioral disturbance: Secondary | ICD-10-CM | POA: Diagnosis not present

## 2021-05-29 DIAGNOSIS — L89329 Pressure ulcer of left buttock, unspecified stage: Secondary | ICD-10-CM | POA: Diagnosis not present

## 2021-05-29 DIAGNOSIS — R319 Hematuria, unspecified: Secondary | ICD-10-CM | POA: Diagnosis not present

## 2021-05-29 DIAGNOSIS — G309 Alzheimer's disease, unspecified: Secondary | ICD-10-CM | POA: Diagnosis not present

## 2021-05-29 DIAGNOSIS — N390 Urinary tract infection, site not specified: Secondary | ICD-10-CM | POA: Diagnosis not present

## 2021-06-02 DIAGNOSIS — Z7982 Long term (current) use of aspirin: Secondary | ICD-10-CM | POA: Diagnosis not present

## 2021-06-02 DIAGNOSIS — N179 Acute kidney failure, unspecified: Secondary | ICD-10-CM | POA: Diagnosis not present

## 2021-06-02 DIAGNOSIS — I472 Ventricular tachycardia, unspecified: Secondary | ICD-10-CM | POA: Diagnosis not present

## 2021-06-02 DIAGNOSIS — A419 Sepsis, unspecified organism: Secondary | ICD-10-CM | POA: Diagnosis not present

## 2021-06-02 DIAGNOSIS — Z23 Encounter for immunization: Secondary | ICD-10-CM | POA: Diagnosis not present

## 2021-06-02 DIAGNOSIS — R000 Tachycardia, unspecified: Secondary | ICD-10-CM | POA: Diagnosis not present

## 2021-06-02 DIAGNOSIS — E7800 Pure hypercholesterolemia, unspecified: Secondary | ICD-10-CM | POA: Diagnosis not present

## 2021-06-02 DIAGNOSIS — R0902 Hypoxemia: Secondary | ICD-10-CM | POA: Diagnosis not present

## 2021-06-02 DIAGNOSIS — R410 Disorientation, unspecified: Secondary | ICD-10-CM | POA: Diagnosis not present

## 2021-06-02 DIAGNOSIS — R279 Unspecified lack of coordination: Secondary | ICD-10-CM | POA: Diagnosis not present

## 2021-06-02 DIAGNOSIS — Z87440 Personal history of urinary (tract) infections: Secondary | ICD-10-CM | POA: Diagnosis not present

## 2021-06-02 DIAGNOSIS — I482 Chronic atrial fibrillation, unspecified: Secondary | ICD-10-CM | POA: Diagnosis not present

## 2021-06-02 DIAGNOSIS — R079 Chest pain, unspecified: Secondary | ICD-10-CM | POA: Diagnosis not present

## 2021-06-02 DIAGNOSIS — N390 Urinary tract infection, site not specified: Secondary | ICD-10-CM | POA: Diagnosis not present

## 2021-06-02 DIAGNOSIS — I4891 Unspecified atrial fibrillation: Secondary | ICD-10-CM | POA: Diagnosis not present

## 2021-06-02 DIAGNOSIS — R531 Weakness: Secondary | ICD-10-CM | POA: Diagnosis not present

## 2021-06-02 DIAGNOSIS — Z792 Long term (current) use of antibiotics: Secondary | ICD-10-CM | POA: Diagnosis not present

## 2021-06-02 DIAGNOSIS — I10 Essential (primary) hypertension: Secondary | ICD-10-CM | POA: Diagnosis not present

## 2021-06-02 DIAGNOSIS — G9340 Encephalopathy, unspecified: Secondary | ICD-10-CM | POA: Diagnosis not present

## 2021-06-02 DIAGNOSIS — I6523 Occlusion and stenosis of bilateral carotid arteries: Secondary | ICD-10-CM | POA: Diagnosis not present

## 2021-06-02 DIAGNOSIS — R319 Hematuria, unspecified: Secondary | ICD-10-CM | POA: Diagnosis not present

## 2021-06-02 DIAGNOSIS — I639 Cerebral infarction, unspecified: Secondary | ICD-10-CM | POA: Diagnosis not present

## 2021-06-02 DIAGNOSIS — G9341 Metabolic encephalopathy: Secondary | ICD-10-CM | POA: Diagnosis not present

## 2021-06-02 DIAGNOSIS — G319 Degenerative disease of nervous system, unspecified: Secondary | ICD-10-CM | POA: Diagnosis not present

## 2021-06-02 DIAGNOSIS — R6520 Severe sepsis without septic shock: Secondary | ICD-10-CM | POA: Diagnosis not present

## 2021-06-02 DIAGNOSIS — J9801 Acute bronchospasm: Secondary | ICD-10-CM | POA: Diagnosis not present

## 2021-06-02 DIAGNOSIS — Z20822 Contact with and (suspected) exposure to covid-19: Secondary | ICD-10-CM | POA: Diagnosis not present

## 2021-06-02 DIAGNOSIS — R5381 Other malaise: Secondary | ICD-10-CM | POA: Diagnosis not present

## 2021-06-02 DIAGNOSIS — J9690 Respiratory failure, unspecified, unspecified whether with hypoxia or hypercapnia: Secondary | ICD-10-CM | POA: Diagnosis not present

## 2021-06-02 DIAGNOSIS — R404 Transient alteration of awareness: Secondary | ICD-10-CM | POA: Diagnosis not present

## 2021-06-02 DIAGNOSIS — I6621 Occlusion and stenosis of right posterior cerebral artery: Secondary | ICD-10-CM | POA: Diagnosis not present

## 2021-06-02 DIAGNOSIS — Z7401 Bed confinement status: Secondary | ICD-10-CM | POA: Diagnosis not present

## 2021-06-02 DIAGNOSIS — Z79899 Other long term (current) drug therapy: Secondary | ICD-10-CM | POA: Diagnosis not present

## 2021-06-02 DIAGNOSIS — E871 Hypo-osmolality and hyponatremia: Secondary | ICD-10-CM | POA: Diagnosis not present

## 2021-06-03 DIAGNOSIS — A419 Sepsis, unspecified organism: Secondary | ICD-10-CM | POA: Diagnosis not present

## 2021-06-08 DIAGNOSIS — N390 Urinary tract infection, site not specified: Secondary | ICD-10-CM | POA: Diagnosis not present

## 2021-06-10 DIAGNOSIS — R319 Hematuria, unspecified: Secondary | ICD-10-CM | POA: Diagnosis not present

## 2021-06-10 DIAGNOSIS — N390 Urinary tract infection, site not specified: Secondary | ICD-10-CM | POA: Diagnosis not present

## 2021-06-25 DIAGNOSIS — I959 Hypotension, unspecified: Secondary | ICD-10-CM | POA: Diagnosis not present

## 2021-06-25 DIAGNOSIS — L89229 Pressure ulcer of left hip, unspecified stage: Secondary | ICD-10-CM | POA: Diagnosis not present

## 2021-06-25 DIAGNOSIS — G9340 Encephalopathy, unspecified: Secondary | ICD-10-CM | POA: Diagnosis not present

## 2021-06-25 DIAGNOSIS — L89329 Pressure ulcer of left buttock, unspecified stage: Secondary | ICD-10-CM | POA: Diagnosis not present

## 2021-06-25 DIAGNOSIS — B9620 Unspecified Escherichia coli [E. coli] as the cause of diseases classified elsewhere: Secondary | ICD-10-CM | POA: Diagnosis not present

## 2021-06-25 DIAGNOSIS — D72828 Other elevated white blood cell count: Secondary | ICD-10-CM | POA: Diagnosis not present

## 2021-06-25 DIAGNOSIS — N390 Urinary tract infection, site not specified: Secondary | ICD-10-CM | POA: Diagnosis not present

## 2021-06-25 DIAGNOSIS — I10 Essential (primary) hypertension: Secondary | ICD-10-CM | POA: Diagnosis not present

## 2021-06-25 DIAGNOSIS — E785 Hyperlipidemia, unspecified: Secondary | ICD-10-CM | POA: Diagnosis not present

## 2021-06-25 DIAGNOSIS — N3000 Acute cystitis without hematuria: Secondary | ICD-10-CM | POA: Diagnosis not present

## 2021-06-25 DIAGNOSIS — A419 Sepsis, unspecified organism: Secondary | ICD-10-CM | POA: Diagnosis not present

## 2021-06-25 DIAGNOSIS — E872 Acidosis, unspecified: Secondary | ICD-10-CM | POA: Diagnosis not present

## 2021-06-25 DIAGNOSIS — M47816 Spondylosis without myelopathy or radiculopathy, lumbar region: Secondary | ICD-10-CM | POA: Diagnosis not present

## 2021-06-25 DIAGNOSIS — L8940 Pressure ulcer of contiguous site of back, buttock and hip, unspecified stage: Secondary | ICD-10-CM | POA: Diagnosis not present

## 2021-06-25 DIAGNOSIS — R0902 Hypoxemia: Secondary | ICD-10-CM | POA: Diagnosis not present

## 2021-06-26 DIAGNOSIS — G9340 Encephalopathy, unspecified: Secondary | ICD-10-CM | POA: Diagnosis not present

## 2021-06-26 DIAGNOSIS — A419 Sepsis, unspecified organism: Secondary | ICD-10-CM | POA: Diagnosis not present

## 2021-06-26 DIAGNOSIS — L89323 Pressure ulcer of left buttock, stage 3: Secondary | ICD-10-CM | POA: Diagnosis not present

## 2021-06-26 DIAGNOSIS — N3000 Acute cystitis without hematuria: Secondary | ICD-10-CM | POA: Diagnosis not present

## 2021-06-26 DIAGNOSIS — E872 Acidosis, unspecified: Secondary | ICD-10-CM | POA: Diagnosis not present

## 2021-06-27 DIAGNOSIS — A419 Sepsis, unspecified organism: Secondary | ICD-10-CM | POA: Diagnosis not present

## 2021-06-27 DIAGNOSIS — L89323 Pressure ulcer of left buttock, stage 3: Secondary | ICD-10-CM | POA: Diagnosis not present

## 2021-06-27 DIAGNOSIS — G9340 Encephalopathy, unspecified: Secondary | ICD-10-CM | POA: Diagnosis not present

## 2021-06-27 DIAGNOSIS — N3000 Acute cystitis without hematuria: Secondary | ICD-10-CM | POA: Diagnosis not present

## 2021-06-27 DIAGNOSIS — E872 Acidosis, unspecified: Secondary | ICD-10-CM | POA: Diagnosis not present

## 2021-06-27 DIAGNOSIS — R000 Tachycardia, unspecified: Secondary | ICD-10-CM | POA: Diagnosis not present

## 2021-06-27 DIAGNOSIS — I440 Atrioventricular block, first degree: Secondary | ICD-10-CM | POA: Diagnosis not present

## 2021-06-28 DIAGNOSIS — A419 Sepsis, unspecified organism: Secondary | ICD-10-CM | POA: Diagnosis not present

## 2021-07-01 DIAGNOSIS — L89154 Pressure ulcer of sacral region, stage 4: Secondary | ICD-10-CM | POA: Diagnosis not present

## 2022-01-07 ENCOUNTER — Other Ambulatory Visit: Payer: Self-pay | Admitting: Legal Medicine

## 2022-01-07 DIAGNOSIS — K219 Gastro-esophageal reflux disease without esophagitis: Secondary | ICD-10-CM

## 2022-01-25 DEATH — deceased
